# Patient Record
Sex: Female | Born: 1960 | Race: Black or African American | Hispanic: No | Marital: Married | Smoking: Never smoker
Health system: Southern US, Community
[De-identification: ages and names within clinical notes are randomized; demographics above are authoritative.]

## PROBLEM LIST (undated history)

## (undated) DIAGNOSIS — Z9889 Other specified postprocedural states: Secondary | ICD-10-CM

## (undated) DIAGNOSIS — H269 Unspecified cataract: Secondary | ICD-10-CM

## (undated) DIAGNOSIS — R7611 Nonspecific reaction to tuberculin skin test without active tuberculosis: Secondary | ICD-10-CM

## (undated) DIAGNOSIS — R51 Headache: Secondary | ICD-10-CM

## (undated) DIAGNOSIS — R112 Nausea with vomiting, unspecified: Secondary | ICD-10-CM

## (undated) DIAGNOSIS — S82209A Unspecified fracture of shaft of unspecified tibia, initial encounter for closed fracture: Secondary | ICD-10-CM

## (undated) DIAGNOSIS — E785 Hyperlipidemia, unspecified: Secondary | ICD-10-CM

## (undated) DIAGNOSIS — K8020 Calculus of gallbladder without cholecystitis without obstruction: Secondary | ICD-10-CM

## (undated) DIAGNOSIS — K219 Gastro-esophageal reflux disease without esophagitis: Secondary | ICD-10-CM

## (undated) DIAGNOSIS — D219 Benign neoplasm of connective and other soft tissue, unspecified: Secondary | ICD-10-CM

## (undated) DIAGNOSIS — I10 Essential (primary) hypertension: Secondary | ICD-10-CM

## (undated) DIAGNOSIS — IMO0002 Reserved for concepts with insufficient information to code with codable children: Secondary | ICD-10-CM

## (undated) DIAGNOSIS — B019 Varicella without complication: Secondary | ICD-10-CM

## (undated) DIAGNOSIS — M1990 Unspecified osteoarthritis, unspecified site: Secondary | ICD-10-CM

## (undated) DIAGNOSIS — G43909 Migraine, unspecified, not intractable, without status migrainosus: Secondary | ICD-10-CM

## (undated) DIAGNOSIS — N809 Endometriosis, unspecified: Secondary | ICD-10-CM

## (undated) HISTORY — DX: Reserved for concepts with insufficient information to code with codable children: IMO0002

## (undated) HISTORY — DX: Essential (primary) hypertension: I10

## (undated) HISTORY — PX: BREAST EXCISIONAL BIOPSY: SUR124

## (undated) HISTORY — DX: Nonspecific reaction to tuberculin skin test without active tuberculosis: R76.11

## (undated) HISTORY — DX: Unspecified cataract: H26.9

## (undated) HISTORY — DX: Varicella without complication: B01.9

## (undated) HISTORY — DX: Unspecified fracture of shaft of unspecified tibia, initial encounter for closed fracture: S82.209A

## (undated) HISTORY — DX: Endometriosis, unspecified: N80.9

## (undated) HISTORY — PX: LUMBAR FUSION: SHX111

## (undated) HISTORY — DX: Gastro-esophageal reflux disease without esophagitis: K21.9

## (undated) HISTORY — DX: Hyperlipidemia, unspecified: E78.5

## (undated) HISTORY — DX: Calculus of gallbladder without cholecystitis without obstruction: K80.20

## (undated) HISTORY — PX: BREAST BIOPSY: SHX20

## (undated) HISTORY — DX: Benign neoplasm of connective and other soft tissue, unspecified: D21.9

## (undated) HISTORY — DX: Migraine, unspecified, not intractable, without status migrainosus: G43.909

---
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
## 1986-09-02 HISTORY — PX: CHOLECYSTECTOMY: SHX55

## 1986-09-02 HISTORY — PX: DIAGNOSTIC LAPAROSCOPY: SUR761

## 1987-09-03 DIAGNOSIS — R87619 Unspecified abnormal cytological findings in specimens from cervix uteri: Secondary | ICD-10-CM

## 1987-09-03 DIAGNOSIS — IMO0002 Reserved for concepts with insufficient information to code with codable children: Secondary | ICD-10-CM

## 1987-09-03 HISTORY — DX: Reserved for concepts with insufficient information to code with codable children: IMO0002

## 1987-09-03 HISTORY — DX: Unspecified abnormal cytological findings in specimens from cervix uteri: R87.619

## 1992-09-02 DIAGNOSIS — D219 Benign neoplasm of connective and other soft tissue, unspecified: Secondary | ICD-10-CM

## 1992-09-02 HISTORY — DX: Benign neoplasm of connective and other soft tissue, unspecified: D21.9

## 1997-09-02 HISTORY — PX: BACK SURGERY: SHX140

## 1997-11-30 ENCOUNTER — Other Ambulatory Visit: Admission: RE | Admit: 1997-11-30 | Discharge: 1997-11-30 | Payer: Self-pay | Admitting: Obstetrics and Gynecology

## 1998-02-10 ENCOUNTER — Encounter: Admission: RE | Admit: 1998-02-10 | Discharge: 1998-05-11 | Payer: Self-pay | Admitting: Anesthesiology

## 1998-04-18 ENCOUNTER — Inpatient Hospital Stay (HOSPITAL_COMMUNITY): Admission: RE | Admit: 1998-04-18 | Discharge: 1998-04-23 | Payer: Self-pay | Admitting: Specialist

## 1998-05-11 ENCOUNTER — Ambulatory Visit (HOSPITAL_COMMUNITY): Admission: RE | Admit: 1998-05-11 | Discharge: 1998-05-11 | Payer: Self-pay | Admitting: Specialist

## 1998-05-15 ENCOUNTER — Ambulatory Visit (HOSPITAL_COMMUNITY): Admission: RE | Admit: 1998-05-15 | Discharge: 1998-05-15 | Payer: Self-pay | Admitting: Specialist

## 1998-06-08 ENCOUNTER — Ambulatory Visit (HOSPITAL_COMMUNITY): Admission: RE | Admit: 1998-06-08 | Discharge: 1998-06-08 | Payer: Self-pay | Admitting: Specialist

## 1998-06-08 ENCOUNTER — Encounter: Payer: Self-pay | Admitting: Specialist

## 1998-06-12 ENCOUNTER — Ambulatory Visit (HOSPITAL_COMMUNITY): Admission: RE | Admit: 1998-06-12 | Discharge: 1998-06-12 | Payer: Self-pay | Admitting: Specialist

## 1998-07-18 ENCOUNTER — Encounter: Admission: RE | Admit: 1998-07-18 | Discharge: 1998-10-03 | Payer: Self-pay | Admitting: Anesthesiology

## 1998-08-02 ENCOUNTER — Ambulatory Visit (HOSPITAL_COMMUNITY): Admission: RE | Admit: 1998-08-02 | Discharge: 1998-08-02 | Payer: Self-pay | Admitting: Family Medicine

## 1998-12-16 ENCOUNTER — Emergency Department (HOSPITAL_COMMUNITY): Admission: EM | Admit: 1998-12-16 | Discharge: 1998-12-17 | Payer: Self-pay | Admitting: Emergency Medicine

## 1998-12-17 ENCOUNTER — Encounter: Payer: Self-pay | Admitting: Emergency Medicine

## 1999-03-23 ENCOUNTER — Encounter: Payer: Self-pay | Admitting: Specialist

## 1999-03-23 ENCOUNTER — Ambulatory Visit (HOSPITAL_COMMUNITY): Admission: RE | Admit: 1999-03-23 | Discharge: 1999-03-23 | Payer: Self-pay | Admitting: Specialist

## 1999-04-11 ENCOUNTER — Other Ambulatory Visit: Admission: RE | Admit: 1999-04-11 | Discharge: 1999-04-11 | Payer: Self-pay | Admitting: Obstetrics and Gynecology

## 1999-04-25 ENCOUNTER — Encounter (INDEPENDENT_AMBULATORY_CARE_PROVIDER_SITE_OTHER): Payer: Self-pay

## 1999-04-25 ENCOUNTER — Other Ambulatory Visit: Admission: RE | Admit: 1999-04-25 | Discharge: 1999-04-25 | Payer: Self-pay | Admitting: Obstetrics and Gynecology

## 1999-05-04 ENCOUNTER — Encounter: Payer: Self-pay | Admitting: Specialist

## 1999-05-04 ENCOUNTER — Ambulatory Visit (HOSPITAL_COMMUNITY): Admission: RE | Admit: 1999-05-04 | Discharge: 1999-05-04 | Payer: Self-pay | Admitting: Specialist

## 1999-05-18 ENCOUNTER — Encounter: Payer: Self-pay | Admitting: Specialist

## 1999-05-18 ENCOUNTER — Ambulatory Visit (HOSPITAL_COMMUNITY): Admission: RE | Admit: 1999-05-18 | Discharge: 1999-05-18 | Payer: Self-pay | Admitting: Specialist

## 1999-08-21 ENCOUNTER — Other Ambulatory Visit: Admission: RE | Admit: 1999-08-21 | Discharge: 1999-08-21 | Payer: Self-pay | Admitting: Obstetrics and Gynecology

## 1999-12-07 ENCOUNTER — Encounter: Admission: RE | Admit: 1999-12-07 | Discharge: 1999-12-07 | Payer: Self-pay | Admitting: Neurosurgery

## 1999-12-07 ENCOUNTER — Encounter: Payer: Self-pay | Admitting: Neurosurgery

## 2000-01-18 ENCOUNTER — Other Ambulatory Visit: Admission: RE | Admit: 2000-01-18 | Discharge: 2000-01-18 | Payer: Self-pay | Admitting: Obstetrics and Gynecology

## 2000-05-22 ENCOUNTER — Other Ambulatory Visit: Admission: RE | Admit: 2000-05-22 | Discharge: 2000-05-22 | Payer: Self-pay

## 2000-06-05 ENCOUNTER — Other Ambulatory Visit: Admission: RE | Admit: 2000-06-05 | Discharge: 2000-06-05 | Payer: Self-pay | Admitting: Obstetrics and Gynecology

## 2000-06-05 ENCOUNTER — Encounter (INDEPENDENT_AMBULATORY_CARE_PROVIDER_SITE_OTHER): Payer: Self-pay | Admitting: Specialist

## 2001-03-09 ENCOUNTER — Other Ambulatory Visit: Admission: RE | Admit: 2001-03-09 | Discharge: 2001-03-09 | Payer: Self-pay | Admitting: Obstetrics and Gynecology

## 2003-05-30 ENCOUNTER — Other Ambulatory Visit: Admission: RE | Admit: 2003-05-30 | Discharge: 2003-05-30 | Payer: Self-pay | Admitting: Obstetrics and Gynecology

## 2003-11-02 ENCOUNTER — Other Ambulatory Visit: Admission: RE | Admit: 2003-11-02 | Discharge: 2003-11-02 | Payer: Self-pay | Admitting: Obstetrics and Gynecology

## 2003-11-21 ENCOUNTER — Emergency Department (HOSPITAL_COMMUNITY): Admission: EM | Admit: 2003-11-21 | Discharge: 2003-11-21 | Payer: Self-pay | Admitting: Emergency Medicine

## 2004-06-12 ENCOUNTER — Other Ambulatory Visit: Admission: RE | Admit: 2004-06-12 | Discharge: 2004-06-12 | Payer: Self-pay | Admitting: Obstetrics and Gynecology

## 2005-09-11 ENCOUNTER — Other Ambulatory Visit: Admission: RE | Admit: 2005-09-11 | Discharge: 2005-09-11 | Payer: Self-pay | Admitting: Obstetrics and Gynecology

## 2006-04-21 ENCOUNTER — Other Ambulatory Visit: Admission: RE | Admit: 2006-04-21 | Discharge: 2006-04-21 | Payer: Self-pay | Admitting: Obstetrics and Gynecology

## 2006-10-27 ENCOUNTER — Ambulatory Visit (HOSPITAL_COMMUNITY): Admission: RE | Admit: 2006-10-27 | Discharge: 2006-10-27 | Payer: Self-pay | Admitting: Orthopedic Surgery

## 2006-11-26 ENCOUNTER — Other Ambulatory Visit: Admission: RE | Admit: 2006-11-26 | Discharge: 2006-11-26 | Payer: Self-pay | Admitting: Obstetrics and Gynecology

## 2006-12-26 ENCOUNTER — Encounter: Admission: RE | Admit: 2006-12-26 | Discharge: 2006-12-26 | Payer: Self-pay | Admitting: Neurosurgery

## 2007-12-07 ENCOUNTER — Other Ambulatory Visit: Admission: RE | Admit: 2007-12-07 | Discharge: 2007-12-07 | Payer: Self-pay | Admitting: Obstetrics and Gynecology

## 2008-04-22 ENCOUNTER — Other Ambulatory Visit: Admission: RE | Admit: 2008-04-22 | Discharge: 2008-04-22 | Payer: Self-pay | Admitting: Obstetrics and Gynecology

## 2008-08-29 ENCOUNTER — Other Ambulatory Visit: Admission: RE | Admit: 2008-08-29 | Discharge: 2008-08-29 | Payer: Self-pay | Admitting: Obstetrics and Gynecology

## 2008-12-14 ENCOUNTER — Other Ambulatory Visit: Admission: RE | Admit: 2008-12-14 | Discharge: 2008-12-14 | Payer: Self-pay | Admitting: Obstetrics and Gynecology

## 2009-04-05 ENCOUNTER — Ambulatory Visit (HOSPITAL_COMMUNITY): Admission: RE | Admit: 2009-04-05 | Discharge: 2009-04-05 | Payer: Self-pay | Admitting: Orthopedic Surgery

## 2009-12-19 ENCOUNTER — Encounter
Admission: RE | Admit: 2009-12-19 | Discharge: 2009-12-19 | Payer: Self-pay | Source: Home / Self Care | Admitting: Internal Medicine

## 2010-09-02 HISTORY — PX: EYE SURGERY: SHX253

## 2010-09-10 ENCOUNTER — Ambulatory Visit (HOSPITAL_COMMUNITY): Admission: RE | Admit: 2010-09-10 | Payer: Self-pay | Source: Home / Self Care | Admitting: Ophthalmology

## 2010-09-12 ENCOUNTER — Ambulatory Visit (HOSPITAL_COMMUNITY)
Admission: RE | Admit: 2010-09-12 | Discharge: 2010-09-12 | Payer: Self-pay | Source: Home / Self Care | Attending: Ophthalmology | Admitting: Ophthalmology

## 2010-09-23 ENCOUNTER — Encounter: Payer: Self-pay | Admitting: Orthopedic Surgery

## 2010-10-03 ENCOUNTER — Encounter: Payer: Self-pay | Admitting: Ophthalmology

## 2011-06-18 ENCOUNTER — Other Ambulatory Visit (HOSPITAL_COMMUNITY): Payer: Self-pay | Admitting: Neurosurgery

## 2011-06-18 DIAGNOSIS — M545 Low back pain, unspecified: Secondary | ICD-10-CM

## 2011-06-18 DIAGNOSIS — M5416 Radiculopathy, lumbar region: Secondary | ICD-10-CM

## 2011-06-21 ENCOUNTER — Ambulatory Visit (HOSPITAL_COMMUNITY)
Admission: RE | Admit: 2011-06-21 | Discharge: 2011-06-21 | Disposition: A | Discharge status: Home / Self Care | Payer: PRIVATE HEALTH INSURANCE | Source: Ambulatory Visit | Attending: Neurosurgery | Admitting: Neurosurgery

## 2011-06-21 DIAGNOSIS — R209 Unspecified disturbances of skin sensation: Secondary | ICD-10-CM | POA: Insufficient documentation

## 2011-06-21 DIAGNOSIS — M79609 Pain in unspecified limb: Secondary | ICD-10-CM | POA: Insufficient documentation

## 2011-06-21 DIAGNOSIS — M7130 Other bursal cyst, unspecified site: Secondary | ICD-10-CM | POA: Insufficient documentation

## 2011-06-21 DIAGNOSIS — M129 Arthropathy, unspecified: Secondary | ICD-10-CM | POA: Insufficient documentation

## 2011-06-21 DIAGNOSIS — M48061 Spinal stenosis, lumbar region without neurogenic claudication: Secondary | ICD-10-CM | POA: Insufficient documentation

## 2011-06-21 DIAGNOSIS — M5416 Radiculopathy, lumbar region: Secondary | ICD-10-CM

## 2011-06-21 DIAGNOSIS — M545 Low back pain, unspecified: Secondary | ICD-10-CM | POA: Insufficient documentation

## 2011-06-21 DIAGNOSIS — Z981 Arthrodesis status: Secondary | ICD-10-CM | POA: Insufficient documentation

## 2011-06-21 MED ORDER — GADOBENATE DIMEGLUMINE 529 MG/ML IV SOLN
20.0000 mL | Freq: Once | INTRAVENOUS | Status: AC | PRN
  Administered 2011-06-21: 17 mL via INTRAVENOUS

## 2011-07-09 ENCOUNTER — Encounter (HOSPITAL_COMMUNITY): Payer: Self-pay

## 2011-07-10 ENCOUNTER — Encounter (HOSPITAL_COMMUNITY): Payer: Self-pay | Admitting: Pharmacy Technician

## 2011-07-11 ENCOUNTER — Other Ambulatory Visit (HOSPITAL_COMMUNITY): Payer: PRIVATE HEALTH INSURANCE

## 2011-07-12 ENCOUNTER — Encounter (HOSPITAL_COMMUNITY): Admission: RE | Payer: Self-pay | Source: Ambulatory Visit

## 2011-07-12 ENCOUNTER — Inpatient Hospital Stay (HOSPITAL_COMMUNITY): Admission: RE | Admit: 2011-07-12 | Payer: PRIVATE HEALTH INSURANCE | Source: Ambulatory Visit | Admitting: Neurosurgery

## 2011-07-12 SURGERY — POSTERIOR LUMBAR FUSION 2 LEVEL
Anesthesia: General

## 2011-08-03 HISTORY — PX: LUMBAR FUSION: SHX111

## 2011-08-21 ENCOUNTER — Other Ambulatory Visit: Payer: Self-pay | Admitting: Neurosurgery

## 2011-08-21 ENCOUNTER — Encounter (HOSPITAL_COMMUNITY): Payer: Self-pay | Admitting: Pharmacy Technician

## 2011-08-23 ENCOUNTER — Encounter (HOSPITAL_COMMUNITY): Payer: Self-pay

## 2011-08-23 ENCOUNTER — Encounter (HOSPITAL_COMMUNITY)
Admission: RE | Admit: 2011-08-23 | Discharge: 2011-08-23 | Disposition: A | Discharge status: Home / Self Care | Payer: PRIVATE HEALTH INSURANCE | Source: Ambulatory Visit | Attending: Neurosurgery | Admitting: Neurosurgery

## 2011-08-23 HISTORY — DX: Headache: R51

## 2011-08-23 HISTORY — DX: Unspecified osteoarthritis, unspecified site: M19.90

## 2011-08-23 LAB — BASIC METABOLIC PANEL
CO2: 26 mEq/L (ref 19–32)
Calcium: 10.4 mg/dL (ref 8.4–10.5)
Chloride: 103 mEq/L (ref 96–112)
Creatinine, Ser: 0.56 mg/dL (ref 0.50–1.10)
Glucose, Bld: 95 mg/dL (ref 70–99)

## 2011-08-23 LAB — CBC
HCT: 39.4 % (ref 36.0–46.0)
MCH: 27.8 pg (ref 26.0–34.0)
MCV: 86.8 fL (ref 78.0–100.0)
Platelets: 226 10*3/uL (ref 150–400)
RDW: 12.5 % (ref 11.5–15.5)

## 2011-08-23 LAB — SURGICAL PCR SCREEN
MRSA, PCR: NEGATIVE
Staphylococcus aureus: NEGATIVE

## 2011-08-23 NOTE — Progress Notes (Signed)
ekg req from dr Cala Bradford [place] by brian tech Blood refusal sent to blood band and dr Franky Macho

## 2011-08-23 NOTE — Pre-Procedure Instructions (Signed)
20 Yvonne Sanders  08/23/2011   Your procedure is scheduled on:  08/28/11  Report to [facility] at 1030 AM.  Call this number if you have problems the morning of surgery: [phone]   Remember:   Do not eat food:After Midnight.  May have clear liquids: up to 4 Hours before arrival.  Clear liquids include soda, tea, black coffee, apple or grape juice, broth.  Take these medicines the morning of surgery with A SIP OF WATER: flexeril, ultram   STOP asa advil, blood thinners ,herbal meds*   Do not wear jewelry, make-up or nail polish.  Do not wear lotions, powders, or perfumes. You may wear deodorant.  Do not shave 48 hours prior to surgery.  Do not bring valuables to the hospital.  Contacts, dentures or bridgework may not be worn into surgery.  Leave suitcase in the car. After surgery it may be brought to your room.  For patients admitted to the hospital, checkout time is 11:00 AM the day of discharge.   Patients discharged the day of surgery will not be allowed to drive home.  Name and phone number of your driver: [name] [phone]  Special Instructions: CHG Shower Use Special Wash: 1/2 bottle night before surgery and 1/2 bottle morning of surgery.   Please read over the following fact sheets that you were given: Pain Booklet, Coughing and Deep Breathing, Blood Transfusion Information, MRSA Information and Surgical Site Infection Prevention

## 2011-08-28 MED ORDER — CEFAZOLIN SODIUM 1-5 GM-% IV SOLN: 1.0000 g | INTRAVENOUS | Status: DC

## 2011-08-28 NOTE — Progress Notes (Signed)
Notified patient that surgery is on12/2 and to be at short Stay by 0930.  Pt repeated information.

## 2011-08-29 ENCOUNTER — Encounter (HOSPITAL_COMMUNITY): Payer: Self-pay | Admitting: Anesthesiology

## 2011-08-29 ENCOUNTER — Ambulatory Visit (HOSPITAL_COMMUNITY): Payer: PRIVATE HEALTH INSURANCE

## 2011-08-29 ENCOUNTER — Encounter (HOSPITAL_COMMUNITY): Payer: Self-pay | Admitting: *Deleted

## 2011-08-29 ENCOUNTER — Ambulatory Visit (HOSPITAL_COMMUNITY): Payer: PRIVATE HEALTH INSURANCE | Admitting: Anesthesiology

## 2011-08-29 ENCOUNTER — Inpatient Hospital Stay (HOSPITAL_COMMUNITY)
Admission: RE | Admit: 2011-08-29 | Discharge: 2011-09-01 | DRG: 460 | Disposition: A | Discharge status: Home / Self Care | Payer: PRIVATE HEALTH INSURANCE | Source: Ambulatory Visit | Attending: Neurosurgery | Admitting: Neurosurgery

## 2011-08-29 ENCOUNTER — Encounter (HOSPITAL_COMMUNITY)
Admission: RE | Disposition: A | Discharge status: Home / Self Care | Payer: Self-pay | Source: Ambulatory Visit | Attending: Neurosurgery

## 2011-08-29 DIAGNOSIS — M47817 Spondylosis without myelopathy or radiculopathy, lumbosacral region: Principal | ICD-10-CM | POA: Diagnosis present

## 2011-08-29 DIAGNOSIS — M48061 Spinal stenosis, lumbar region without neurogenic claudication: Secondary | ICD-10-CM | POA: Diagnosis present

## 2011-08-29 DIAGNOSIS — M7138 Other bursal cyst, other site: Secondary | ICD-10-CM | POA: Diagnosis present

## 2011-08-29 DIAGNOSIS — M47816 Spondylosis without myelopathy or radiculopathy, lumbar region: Secondary | ICD-10-CM | POA: Diagnosis present

## 2011-08-29 DIAGNOSIS — M7130 Other bursal cyst, unspecified site: Secondary | ICD-10-CM | POA: Diagnosis present

## 2011-08-29 DIAGNOSIS — Z6841 Body Mass Index (BMI) 40.0 and over, adult: Secondary | ICD-10-CM

## 2011-08-29 DIAGNOSIS — Z01812 Encounter for preprocedural laboratory examination: Secondary | ICD-10-CM

## 2011-08-29 LAB — GLUCOSE, CAPILLARY: Glucose-Capillary: 65 mg/dL — ABNORMAL LOW (ref 70–99)

## 2011-08-29 SURGERY — POSTERIOR LUMBAR FUSION 1 LEVEL
Anesthesia: General | Site: Spine Lumbar | Wound class: Clean

## 2011-08-29 MED ORDER — HEMOSTATIC AGENTS (NO CHARGE) OPTIME
TOPICAL | Status: DC | PRN
  Administered 2011-08-29: 1 via TOPICAL

## 2011-08-29 MED ORDER — ALUM & MAG HYDROXIDE-SIMETH 200-200-20 MG/5ML PO SUSP: 30.0000 mL | Freq: Four times a day (QID) | ORAL | Status: DC | PRN

## 2011-08-29 MED ORDER — POTASSIUM CHLORIDE IN NACL 20-0.9 MEQ/L-% IV SOLN
INTRAVENOUS | Status: DC
  Administered 2011-08-30 – 2011-09-01 (×4): via INTRAVENOUS
  Filled 2011-08-29 (×6): qty 1000

## 2011-08-29 MED ORDER — PROPOFOL 10 MG/ML IV EMUL
INTRAVENOUS | Status: DC | PRN
  Administered 2011-08-29: 180 mg via INTRAVENOUS
  Administered 2011-08-29: 20 mg via INTRAVENOUS

## 2011-08-29 MED ORDER — MENTHOL 3 MG MT LOZG
1.0000 | LOZENGE | OROMUCOSAL | Status: DC | PRN
  Administered 2011-08-30: 3 mg via ORAL
  Filled 2011-08-29: qty 9

## 2011-08-29 MED ORDER — THROMBIN 20000 UNITS EX KIT
PACK | CUTANEOUS | Status: DC | PRN
  Administered 2011-08-29: 20000 [IU] via TOPICAL

## 2011-08-29 MED ORDER — ONDANSETRON HCL 4 MG/2ML IJ SOLN
INTRAMUSCULAR | Status: AC
  Administered 2011-08-29: 4 mg via INTRAVENOUS
  Filled 2011-08-29: qty 2

## 2011-08-29 MED ORDER — NORETHINDRONE 0.35 MG PO TABS
1.0000 | ORAL_TABLET | Freq: Every day | ORAL | Status: DC
  Administered 2011-08-31 – 2011-09-01 (×2): 0.35 mg via ORAL

## 2011-08-29 MED ORDER — HYDROMORPHONE HCL PF 1 MG/ML IJ SOLN
INTRAMUSCULAR | Status: AC
  Administered 2011-08-29: 0.5 mg via INTRAVENOUS
  Filled 2011-08-29: qty 1

## 2011-08-29 MED ORDER — NALOXONE HCL 0.4 MG/ML IJ SOLN
0.4000 mg | INTRAMUSCULAR | Status: DC | PRN
  Filled 2011-08-29: qty 1

## 2011-08-29 MED ORDER — SODIUM CHLORIDE 0.9 % IV SOLN: 250.0000 mL | INTRAVENOUS | Status: DC

## 2011-08-29 MED ORDER — ACETAMINOPHEN 650 MG RE SUPP: 650.0000 mg | RECTAL | Status: DC | PRN

## 2011-08-29 MED ORDER — ONDANSETRON HCL 4 MG/2ML IJ SOLN
4.0000 mg | INTRAMUSCULAR | Status: DC | PRN
  Administered 2011-08-30: 4 mg via INTRAVENOUS
  Filled 2011-08-29: qty 2

## 2011-08-29 MED ORDER — MIDAZOLAM HCL 5 MG/5ML IJ SOLN
INTRAMUSCULAR | Status: DC | PRN
  Administered 2011-08-29: 2 mg via INTRAVENOUS

## 2011-08-29 MED ORDER — NEOSTIGMINE METHYLSULFATE 1 MG/ML IJ SOLN
INTRAMUSCULAR | Status: DC | PRN
  Administered 2011-08-29: 3 mg via INTRAVENOUS

## 2011-08-29 MED ORDER — DEXTROSE-NACL 5-0.2 % IV SOLN
INTRAVENOUS | Status: DC | PRN
  Administered 2011-08-29: 15:00:00 via INTRAVENOUS

## 2011-08-29 MED ORDER — DIPHENHYDRAMINE HCL 50 MG/ML IJ SOLN
12.5000 mg | Freq: Four times a day (QID) | INTRAMUSCULAR | Status: DC | PRN
  Filled 2011-08-29: qty 0.25

## 2011-08-29 MED ORDER — OMEGA-3-ACID ETHYL ESTERS 1 G PO CAPS
2.0000 g | ORAL_CAPSULE | Freq: Every day | ORAL | Status: DC
  Administered 2011-08-30 – 2011-09-01 (×3): 2 g via ORAL
  Filled 2011-08-29 (×3): qty 2

## 2011-08-29 MED ORDER — FAMOTIDINE 10 MG/ML IV SOLN
40.0000 mg/h | Freq: Once | INTRAVENOUS | Status: AC
  Administered 2011-08-30: 40 mg/h via INTRAVENOUS
  Filled 2011-08-29: qty 4

## 2011-08-29 MED ORDER — PHENOL 1.4 % MT LIQD: 1.0000 | OROMUCOSAL | Status: DC | PRN

## 2011-08-29 MED ORDER — PHENYLEPHRINE HCL 10 MG/ML IJ SOLN
INTRAMUSCULAR | Status: DC | PRN
  Administered 2011-08-29: 80 ug via INTRAVENOUS
  Administered 2011-08-29: 40 ug via INTRAVENOUS

## 2011-08-29 MED ORDER — CARBONYL IRON 45 MG PO TABS: 45.0000 mg | ORAL_TABLET | Freq: Every day | ORAL | Status: DC

## 2011-08-29 MED ORDER — THERA M PLUS PO TABS
1.0000 | ORAL_TABLET | Freq: Every day | ORAL | Status: DC
  Administered 2011-08-30 – 2011-09-01 (×3): 1 via ORAL
  Filled 2011-08-29 (×3): qty 1

## 2011-08-29 MED ORDER — CEFAZOLIN SODIUM 1-5 GM-% IV SOLN
1.0000 g | Freq: Three times a day (TID) | INTRAVENOUS | Status: AC
  Administered 2011-08-30: 1 g via INTRAVENOUS
  Filled 2011-08-29 (×2): qty 50

## 2011-08-29 MED ORDER — CEFAZOLIN SODIUM-DEXTROSE 2-3 GM-% IV SOLR
2.0000 g | INTRAVENOUS | Status: AC
  Administered 2011-08-29: 2 g via INTRAVENOUS

## 2011-08-29 MED ORDER — FENTANYL CITRATE 0.05 MG/ML IJ SOLN
INTRAMUSCULAR | Status: DC | PRN
  Administered 2011-08-29 (×8): 50 ug via INTRAVENOUS

## 2011-08-29 MED ORDER — FERROUS SULFATE 325 (65 FE) MG PO TABS
325.0000 mg | ORAL_TABLET | Freq: Every day | ORAL | Status: DC
  Administered 2011-08-30 – 2011-09-01 (×3): 325 mg via ORAL
  Filled 2011-08-29 (×4): qty 1

## 2011-08-29 MED ORDER — ROCURONIUM BROMIDE 100 MG/10ML IV SOLN
INTRAVENOUS | Status: DC | PRN
  Administered 2011-08-29: 30 mg via INTRAVENOUS

## 2011-08-29 MED ORDER — LIDOCAINE HCL (CARDIAC) 20 MG/ML IV SOLN
INTRAVENOUS | Status: DC | PRN
  Administered 2011-08-29: 100 mg via INTRAVENOUS

## 2011-08-29 MED ORDER — CEFAZOLIN SODIUM-DEXTROSE 2-3 GM-% IV SOLR
INTRAVENOUS | Status: AC
  Filled 2011-08-29: qty 50

## 2011-08-29 MED ORDER — LIDOCAINE-EPINEPHRINE 0.5-1:200000 % IJ SOLN
INTRAMUSCULAR | Status: DC | PRN
  Administered 2011-08-29: 5 mL

## 2011-08-29 MED ORDER — ONDANSETRON HCL 4 MG/2ML IJ SOLN
4.0000 mg | Freq: Four times a day (QID) | INTRAMUSCULAR | Status: DC | PRN
  Administered 2011-08-29: 4 mg via INTRAVENOUS
  Filled 2011-08-29 (×2): qty 2

## 2011-08-29 MED ORDER — ACETAMINOPHEN 325 MG PO TABS
650.0000 mg | ORAL_TABLET | ORAL | Status: DC | PRN
  Administered 2011-08-30: 650 mg via ORAL
  Filled 2011-08-29: qty 2

## 2011-08-29 MED ORDER — ONDANSETRON HCL 4 MG/2ML IJ SOLN: 4.0000 mg | Freq: Four times a day (QID) | INTRAMUSCULAR | Status: DC | PRN

## 2011-08-29 MED ORDER — DIPHENHYDRAMINE HCL 12.5 MG/5ML PO ELIX
12.5000 mg | ORAL_SOLUTION | Freq: Four times a day (QID) | ORAL | Status: DC | PRN
  Administered 2011-08-29: 12.5 mg via ORAL
  Filled 2011-08-29: qty 5
  Filled 2011-08-29: qty 10

## 2011-08-29 MED ORDER — TRAMADOL HCL 50 MG PO TABS
50.0000 mg | ORAL_TABLET | Freq: Four times a day (QID) | ORAL | Status: DC | PRN
  Administered 2011-08-30 (×2): 100 mg via ORAL
  Filled 2011-08-29 (×3): qty 2

## 2011-08-29 MED ORDER — ONDANSETRON HCL 4 MG/2ML IJ SOLN
INTRAMUSCULAR | Status: DC | PRN
  Administered 2011-08-29: 4 mg via INTRAVENOUS

## 2011-08-29 MED ORDER — GLYCOPYRROLATE 0.2 MG/ML IJ SOLN
INTRAMUSCULAR | Status: DC | PRN
  Administered 2011-08-29: .5 mg via INTRAVENOUS

## 2011-08-29 MED ORDER — SODIUM CHLORIDE 0.9 % IJ SOLN: 9.0000 mL | INTRAMUSCULAR | Status: DC | PRN

## 2011-08-29 MED ORDER — FOLIC ACID 1 MG PO TABS
1.0000 mg | ORAL_TABLET | Freq: Every day | ORAL | Status: DC
  Administered 2011-08-30 – 2011-09-01 (×3): 1 mg via ORAL
  Filled 2011-08-29 (×3): qty 1

## 2011-08-29 MED ORDER — HYDROMORPHONE HCL PF 1 MG/ML IJ SOLN
0.2500 mg | INTRAMUSCULAR | Status: DC | PRN
  Administered 2011-08-29 (×2): 0.5 mg via INTRAVENOUS

## 2011-08-29 MED ORDER — DIAZEPAM 5 MG PO TABS
5.0000 mg | ORAL_TABLET | Freq: Four times a day (QID) | ORAL | Status: DC | PRN
  Administered 2011-08-30 – 2011-09-01 (×9): 5 mg via ORAL
  Filled 2011-08-29 (×9): qty 1

## 2011-08-29 MED ORDER — HYDROMORPHONE 0.3 MG/ML IV SOLN
INTRAVENOUS | Status: DC
  Administered 2011-08-29: 0.9 mg via INTRAVENOUS
  Administered 2011-08-29: 21:00:00 via INTRAVENOUS

## 2011-08-29 MED ORDER — DROPERIDOL 2.5 MG/ML IJ SOLN
INTRAMUSCULAR | Status: DC | PRN
  Administered 2011-08-29: 0.625 mg via INTRAVENOUS

## 2011-08-29 MED ORDER — 0.9 % SODIUM CHLORIDE (POUR BTL) OPTIME
TOPICAL | Status: DC | PRN
  Administered 2011-08-29: 1000 mL

## 2011-08-29 MED ORDER — HETASTARCH-ELECTROLYTES 6 % IV SOLN
INTRAVENOUS | Status: DC | PRN
  Administered 2011-08-29: 18:00:00 via INTRAVENOUS

## 2011-08-29 MED ORDER — SODIUM CHLORIDE 0.9 % IJ SOLN
3.0000 mL | Freq: Two times a day (BID) | INTRAMUSCULAR | Status: DC
  Administered 2011-08-30 – 2011-08-31 (×3): 3 mL via INTRAVENOUS

## 2011-08-29 MED ORDER — SODIUM CHLORIDE 0.9 % IJ SOLN: 3.0000 mL | INTRAMUSCULAR | Status: DC | PRN

## 2011-08-29 MED ORDER — EZETIMIBE 10 MG PO TABS
10.0000 mg | ORAL_TABLET | Freq: Every day | ORAL | Status: DC
Start: 2011-08-30 — End: 2011-09-01
  Administered 2011-08-30 – 2011-09-01 (×3): 10 mg via ORAL
  Filled 2011-08-29 (×3): qty 1

## 2011-08-29 MED ORDER — LACTATED RINGERS IV SOLN
INTRAVENOUS | Status: DC | PRN
  Administered 2011-08-29 (×3): via INTRAVENOUS

## 2011-08-29 MED ORDER — DOCUSATE SODIUM 100 MG PO CAPS
100.0000 mg | ORAL_CAPSULE | Freq: Two times a day (BID) | ORAL | Status: DC
  Administered 2011-08-30 – 2011-09-01 (×5): 100 mg via ORAL
  Filled 2011-08-29 (×3): qty 1

## 2011-08-29 MED ORDER — OMEGA-3 FATTY ACIDS 1000 MG PO CAPS: 2.0000 g | ORAL_CAPSULE | Freq: Every day | ORAL | Status: DC

## 2011-08-29 MED ORDER — ZOLPIDEM TARTRATE 10 MG PO TABS: 10.0000 mg | ORAL_TABLET | Freq: Every evening | ORAL | Status: DC | PRN

## 2011-08-29 MED ORDER — ROSUVASTATIN CALCIUM 40 MG PO TABS
40.0000 mg | ORAL_TABLET | Freq: Every day | ORAL | Status: DC
  Administered 2011-08-30 – 2011-09-01 (×3): 40 mg via ORAL
  Filled 2011-08-29 (×3): qty 1

## 2011-08-29 SURGICAL SUPPLY — 85 items
10x9x28 4 degree Coroent Cage (Cage) ×1 IMPLANT
5.5x25 Single Ball Rod (Rod) ×1 IMPLANT
5.5x30 Single Ball Rod (Rod) ×1 IMPLANT
ADH SKN CLS APL DERMABOND .7 (GAUZE/BANDAGES/DRESSINGS) ×1
APL SKNCLS STERI-STRIP NONHPOA (GAUZE/BANDAGES/DRESSINGS)
BAG DECANTER FOR FLEXI CONT (MISCELLANEOUS) ×2 IMPLANT
BENZOIN TINCTURE PRP APPL 2/3 (GAUZE/BANDAGES/DRESSINGS) IMPLANT
BLADE SURG ROTATE 9660 (MISCELLANEOUS) IMPLANT
BONE MATRIX OSTEOCEL PLUS 10CC (Bone Implant) ×1 IMPLANT
BUR MATCHSTICK NEURO 3.0 LAGG (BURR) ×2 IMPLANT
CANISTER SUCTION 2500CC (MISCELLANEOUS) ×2 IMPLANT
CLIP NEUROVISION LG (CLIP) ×1 IMPLANT
CLOTH BEACON ORANGE TIMEOUT ST (SAFETY) ×2 IMPLANT
CONT SPEC 4OZ CLIKSEAL STRL BL (MISCELLANEOUS) ×5 IMPLANT
CORDS BIPOLAR (ELECTRODE) ×1 IMPLANT
COVER BACK TABLE 24X17X13 BIG (DRAPES) IMPLANT
DECANTER SPIKE VIAL GLASS SM (MISCELLANEOUS) ×2 IMPLANT
DERMABOND ADVANCED (GAUZE/BANDAGES/DRESSINGS) ×1
DERMABOND ADVANCED .7 DNX12 (GAUZE/BANDAGES/DRESSINGS) ×1 IMPLANT
DRAPE C-ARM 42X72 X-RAY (DRAPES) ×4 IMPLANT
DRAPE C-ARMOR (DRAPES) ×1 IMPLANT
DRAPE LAPAROTOMY 100X72X124 (DRAPES) ×2 IMPLANT
DRAPE POUCH INSTRU U-SHP 10X18 (DRAPES) ×2 IMPLANT
DRAPE SURG 17X23 STRL (DRAPES) ×2 IMPLANT
DRESSING TELFA 8X3 (GAUZE/BANDAGES/DRESSINGS) IMPLANT
DURAPREP 26ML APPLICATOR (WOUND CARE) ×2 IMPLANT
DYNAMIC STIMULATION CLIP AND INLINE ACTIVATOR ×1 IMPLANT
ELECT REM PT RETURN 9FT ADLT (ELECTROSURGICAL) ×2
ELECTRODE REM PT RTRN 9FT ADLT (ELECTROSURGICAL) ×1 IMPLANT
GAUZE SPONGE 4X4 16PLY XRAY LF (GAUZE/BANDAGES/DRESSINGS) IMPLANT
GLOVE BIO SURGEON STRL SZ 6.5 (GLOVE) ×2 IMPLANT
GLOVE BIO SURGEON STRL SZ7 (GLOVE) ×1 IMPLANT
GLOVE BIOGEL PI IND STRL 6.5 (GLOVE) IMPLANT
GLOVE BIOGEL PI IND STRL 7.5 (GLOVE) IMPLANT
GLOVE BIOGEL PI IND STRL 8.5 (GLOVE) IMPLANT
GLOVE BIOGEL PI INDICATOR 6.5 (GLOVE) ×2
GLOVE BIOGEL PI INDICATOR 7.5 (GLOVE) ×1
GLOVE BIOGEL PI INDICATOR 8.5 (GLOVE) ×1
GLOVE ECLIPSE 6.5 STRL STRAW (GLOVE) ×4 IMPLANT
GLOVE ECLIPSE 7.5 STRL STRAW (GLOVE) ×1 IMPLANT
GLOVE EXAM NITRILE LRG STRL (GLOVE) IMPLANT
GLOVE EXAM NITRILE MD LF STRL (GLOVE) ×1 IMPLANT
GLOVE EXAM NITRILE XL STR (GLOVE) IMPLANT
GLOVE EXAM NITRILE XS STR PU (GLOVE) IMPLANT
GLOVE SURG SS PI 7.0 STRL IVOR (GLOVE) ×1 IMPLANT
GLOVE SURG SS PI 8.0 STRL IVOR (GLOVE) ×1 IMPLANT
GOWN BRE IMP SLV AUR LG STRL (GOWN DISPOSABLE) ×6 IMPLANT
GOWN BRE IMP SLV AUR XL STRL (GOWN DISPOSABLE) ×1 IMPLANT
GOWN STRL REIN 2XL LVL4 (GOWN DISPOSABLE) ×1 IMPLANT
HEADS TULIP (Orthopedic Implant) ×4 IMPLANT
KIT BASIN OR (CUSTOM PROCEDURE TRAY) ×2 IMPLANT
KIT NDL NVM5 EMG ELECT (KITS) IMPLANT
KIT NEEDLE NVM5 EMG ELECT (KITS) ×1 IMPLANT
KIT NEEDLE NVM5 EMG ELECTRODE (KITS) ×1
KIT POSITION SURG JACKSON T1 (MISCELLANEOUS) ×2 IMPLANT
KIT ROOM TURNOVER OR (KITS) ×2 IMPLANT
LIGHT SOURCE ANGLE TIP STR 7FT (MISCELLANEOUS) ×1 IMPLANT
NDL HYPO 25X1 1.5 SAFETY (NEEDLE) ×1 IMPLANT
NDL SPNL 18GX3.5 QUINCKE PK (NEEDLE) IMPLANT
NEEDLE HYPO 25X1 1.5 SAFETY (NEEDLE) ×2 IMPLANT
NEEDLE SPNL 18GX3.5 QUINCKE PK (NEEDLE) IMPLANT
NS IRRIG 1000ML POUR BTL (IV SOLUTION) ×2 IMPLANT
NVM5 PROBE ×1 IMPLANT
NVMS EMG NEEDLE MODULE ×1 IMPLANT
PACK LAMINECTOMY NEURO (CUSTOM PROCEDURE TRAY) ×2 IMPLANT
PAD ARMBOARD 7.5X6 YLW CONV (MISCELLANEOUS) ×4 IMPLANT
PATTIES SURGICAL .5 X.5 (GAUZE/BANDAGES/DRESSINGS) ×1 IMPLANT
PROBE BALL TIP NVM5 SNG USE (BALLOONS) ×1 IMPLANT
SCREW LOCK 5.5MM ROD (Screw) ×4 IMPLANT
SCREW SHANK MASTLIS 5.5X30 (Screw) ×2 IMPLANT
SCREW SHANK MASTLIS 5.5X35 (Screw) ×2 IMPLANT
SLEEVE SURGEON STRL (DRAPES) ×2 IMPLANT
SPONGE GAUZE 4X4 12PLY (GAUZE/BANDAGES/DRESSINGS) IMPLANT
SPONGE LAP 4X18 X RAY DECT (DISPOSABLE) IMPLANT
SPONGE SURGIFOAM ABS GEL 100 (HEMOSTASIS) ×2 IMPLANT
STRIP CLOSURE SKIN 1/2X4 (GAUZE/BANDAGES/DRESSINGS) IMPLANT
SUT PROLENE 6 0 BV (SUTURE) IMPLANT
SUT VIC AB 0 CT1 18XCR BRD8 (SUTURE) ×1 IMPLANT
SUT VIC AB 0 CT1 8-18 (SUTURE) ×2
SUT VIC AB 2-0 CT1 18 (SUTURE) ×2 IMPLANT
SUT VIC AB 3-0 SH 8-18 (SUTURE) ×2 IMPLANT
SYR 20ML ECCENTRIC (SYRINGE) ×2 IMPLANT
TOWEL OR 17X24 6PK STRL BLUE (TOWEL DISPOSABLE) ×2 IMPLANT
TOWEL OR 17X26 10 PK STRL BLUE (TOWEL DISPOSABLE) ×2 IMPLANT
WATER STERILE IRR 1000ML POUR (IV SOLUTION) ×2 IMPLANT

## 2011-08-29 NOTE — Preoperative (Signed)
Beta Blockers   Reason not to administer Beta Blockers:Pt does not take B Blockers 

## 2011-08-29 NOTE — H&P (Addendum)
  BP 146/92  Pulse 85  Temp(Src) 98.3 F (36.8 C) (Oral)  Resp 20  Ht 5' 5.5" (1.664 m)  Wt 87.091 kg (192 lb)  BMI 31.46 kg/m2  SpO2 96% Yvonne Sanders returned to my office with new complaints of back and lower extremity pain..  She has a new MRI of the lumbar spine. I compared this to the scan that she had in 2008 of the lumbar spine.  She has advanced facet arthropathy at L3/4 with a great deal of fluid and joint separation present.  She has a large synovial cyst on the left side compressing the thecal sac and left L4 root.  She is stenotic at this level with some redundant ligamentum flavum.  The rest of the spine looks quite good.    This is clearly the reason why she is having the lower extremity pain and clearly why the left lower extremity pain is worse.  Unfortunately, looking at this, there is no simple operation that could be done just a fusion.  To do anything else would render her 100% certain that she would have future instability which is greater than the instability that is already present now.  The synovial cyst and facet joints in comparison are obvious that she is not stable at the L3/4 level.    We discussed at length what the surgery would entail.  She cannot have an anterior approach because the synovial cyst has to be removed.  She does need a posterior decompression rendering a lateral approach essentially useless for the decompression and if one needs to go posteriorly you might as well do this from a posterior approach.  I did tell her that I would use interbody cages that would be smaller than the Ray cages. She has very bad memories of that procedure and needed to make sure that she understood why we were going to do this and the reason for placing the cages.  We spoke at length about the fact that I think it will be four months before the operation is not the first thing she thinks about each morning, that her leg pain should improve much quicker than the back problems.  She  had not been living with a great deal of back pain or nothing more than a baseline level and it was only when she developed this lower extremity discomfort that she knew this was a different scenario than had been present previously.  physical exam Alert and oriented x4, speech clear and fluent Peerl, full eom Symmetric facies Hearing intact to voice Tongue and uvula midline 5./5 strength in lower extremities. Normal muscle tone and bulk. Reflexes intact 2+ in lower extremities I spent about 40 minutes with Yvonne Sanders going over this today.  She is going to think about the procedure.  We went over the risks and benefits and I gave her a very detailed instruction sheet which goes over the risks of fusion, fusion failure, hardware failure, need for further surgery, future degeneration of the disc spaces which have not been fused, damage to the nerve roots, bowel or bladder dysfunction, and infection.  She understands and will proceed with lumbar fusion at L3/4. Risks and benefits including but not limited to bleeding infection, need for further surgery, fusion failure, hardware fAilure, no relief of pain, weakness, bowel and or bladder dysfunction were discuseed.

## 2011-08-29 NOTE — Anesthesia Procedure Notes (Addendum)
Procedure Name: Intubation Date/Time: 08/29/2011 3:17 PM Performed by: Rossie Muskrat Pre-anesthesia Checklist: Patient identified, Timeout performed, Emergency Drugs available, Suction available and Patient being monitored Patient Re-evaluated:Patient Re-evaluated prior to inductionOxygen Delivery Method: Circle System Utilized Preoxygenation: Pre-oxygenation with 100% oxygen Intubation Type: IV induction Ventilation: Mask ventilation without difficulty Laryngoscope Size: Miller and 2 Grade View: Grade I Tube type: Oral Tube size: 7.5 mm Number of attempts: 1 Airway Equipment and Method: stylet Placement Confirmation: ETT inserted through vocal cords under direct vision,  breath sounds checked- equal and bilateral and positive ETCO2 Secured at: 21 cm Tube secured with: Tape Dental Injury: Teeth and Oropharynx as per pre-operative assessment

## 2011-08-29 NOTE — Op Note (Signed)
08/29/2011  8:08 PM  PATIENT:  Yvonne Sanders  50 y.o. 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 Synovial Cyst Left L3/4  POST-OPERATIVE DIAGNOSIS:  Lumbar Stenosis, Lumbar Spondylosis,synovial Cyst Synovial Cyst Left L3/4  PROCEDURE:  Procedure(s): POSTERIOR LUMBAR Interbody FUSION 1 LEVEL nuvasive 10x9x56mm Peek cage L3/4, morsellized autograft nonsegmental pedicle screw placement, cortical variant 5.49mm 35mm, and 30mm lengths Nuvasive Posterolateral Arthrodesis L3/4 morsellized allograft Lumbar decompression beyond what was needed for a PLIF  SURGEON:  Surgeon(s): Ronaldo Miyamoto L Gabbriella Presswood [employer]  ASSISTANTS:botero  ANESTHESIA:   general  EBL:  Total I/O In: 1000 [I.V.:1000] Out: 200 [Urine:100; Blood:100]  BLOOD ADMINISTERED:none  CELL SAVER GIVEN:none   COUNT:per nursing   DRAINS: none   SPECIMEN:  No Specimen  DICTATION: Mrs. Bova was brought to the operating room intubated, placed under a general anesthetic, and positioned prone on the jackson table. Prior to positioning a foley catheter was placed under sterile conditions. All pressure points were properly padded. Needles were also placed to allow for neuromonitoring during the case.  Her back was prepped and draped in a sterile fashion. I opened the lumbar region and exposed the lamina of L2, L3, and L4. I exposed the pars interarticularis bilaterally of L3, and L4. I also exposed the facets of L3/4. Having confirmed my location with intraoperative flouroscopy I placed 4 cortical medial to lateral trajectory pedicle screws. I also used neuromonitoring during screw placement.The Right L4 screw was slightly lateral at its exit but did have good purchase.  I then decompressed the L3 and L4 nerve roots bilaterally which was not necessary to complete the PLIF. I also decompressed the spinal canal at L3/4, and removed the synovial cyst on the left side. I performed a  discetomy on the Right side only, due to what appeared to be a nerve root overlying the disc space on the left side. With the discetomy complete I sized the space and placed a 10x9x27mm peek cage into the disc space. I placed morsellized autograft into the disc space around the cage to complete the PLIF. I decorticated the facets and lateral lamina and placed morsellized auto and allograft(osteocel) laterally. I completed the pedicle screw construct by connecting the rod to the screws with locking caps. I performed the decompression, Plif, pla, with dr. Cassandria Santee assistance. We closed the wound in layers using vicryl sutures. I used dermabond for a sterile dressing. Mrs. Mazo was extubated and moving all extremities post op.  PLAN OF CARE: Admit to inpatient   PATIENT DISPOSITION:  PACU - hemodynamically stable.   Delay start of Pharmacological VTE agent (>24hrs) due to surgical blood loss or risk of bleeding:  yes

## 2011-08-29 NOTE — Transfer of Care (Signed)
Immediate Anesthesia Transfer of Care Note  Patient: Yvonne Sanders  Procedure(s) Performed:  POSTERIOR LUMBAR FUSION 1 LEVEL - Lumbar three-four posterior lumbar interbody fusion with arthrodesis and cages with posterior segmental instrumentation  Patient Location: PACU  Anesthesia Type: General  Level of Consciousness: awake, sedated, patient cooperative and responds to stimulation  Airway & Oxygen Therapy: Patient Spontanous Breathing and Patient connected to nasal cannula oxygen  Post-op Assessment: Report given to PACU RN, Post -op Vital signs reviewed and stable, Patient moving all extremities and Patient moving all extremities X 4  Post vital signs: Reviewed and stable  Complications: No apparent anesthesia complications

## 2011-08-29 NOTE — Anesthesia Postprocedure Evaluation (Signed)
  Anesthesia Post-op Note  Patient: Yvonne Sanders  Procedure(s) Performed:  POSTERIOR LUMBAR FUSION 1 LEVEL - Lumbar three-four posterior lumbar interbody fusion with arthrodesis and cages with posterior segmental instrumentation  Patient Location: PACU  Anesthesia Type: General  Level of Consciousness: awake, alert  and oriented  Airway and Oxygen Therapy: Patient Spontanous Breathing and Patient connected to nasal cannula oxygen  Post-op Pain: mild  Post-op Assessment: Post-op Vital signs reviewed, Patient's Cardiovascular Status Stable, Respiratory Function Stable, Patent Airway, No signs of Nausea or vomiting and Pain level controlled  Post-op Vital Signs: Reviewed and stable  Complications: No apparent anesthesia complications

## 2011-08-29 NOTE — Anesthesia Preprocedure Evaluation (Addendum)
Anesthesia Evaluation  Patient identified by MRN, date of birth, ID band Patient awake    Reviewed: Allergy & Precautions, H&P , NPO status , Patient's Chart, lab work & pertinent test results  Airway Mallampati: II  Neck ROM: full    Dental   Pulmonary          Cardiovascular     Neuro/Psych  Headaches,    GI/Hepatic   Endo/Other  Morbid obesity  Renal/GU      Musculoskeletal  (+) Arthritis -, Osteoarthritis,    Abdominal   Peds  Hematology   Anesthesia Other Findings   Reproductive/Obstetrics                          Anesthesia Physical Anesthesia Plan  ASA: II  Anesthesia Plan: General   Post-op Pain Management:    Induction: Intravenous  Airway Management Planned: Oral ETT  Additional Equipment:   Intra-op Plan:   Post-operative Plan: Extubation in OR  Informed Consent: I have reviewed the patients History and Physical, chart, labs and discussed the procedure including the risks, benefits and alternatives for the proposed anesthesia with the patient or authorized representative who has indicated his/her understanding and acceptance.     Plan Discussed with: CRNA and Surgeon  Anesthesia Plan Comments:         Anesthesia Quick Evaluation

## 2011-08-30 LAB — GLUCOSE, CAPILLARY: Glucose-Capillary: 85 mg/dL (ref 70–99)

## 2011-08-30 MED ORDER — SODIUM CHLORIDE 0.9 % IJ SOLN: 9.0000 mL | INTRAMUSCULAR | Status: DC | PRN

## 2011-08-30 MED ORDER — ONDANSETRON HCL 4 MG/2ML IJ SOLN: 4.0000 mg | Freq: Four times a day (QID) | INTRAMUSCULAR | Status: DC | PRN

## 2011-08-30 MED ORDER — DIPHENHYDRAMINE HCL 50 MG/ML IJ SOLN: 12.5000 mg | Freq: Four times a day (QID) | INTRAMUSCULAR | Status: DC | PRN

## 2011-08-30 MED ORDER — NALOXONE HCL 0.4 MG/ML IJ SOLN: 0.4000 mg | INTRAMUSCULAR | Status: DC | PRN

## 2011-08-30 MED ORDER — DIPHENHYDRAMINE HCL 12.5 MG/5ML PO ELIX: 12.5000 mg | ORAL_SOLUTION | Freq: Four times a day (QID) | ORAL | Status: DC | PRN

## 2011-08-30 MED ORDER — PROCHLORPERAZINE 25 MG RE SUPP
25.0000 mg | Freq: Two times a day (BID) | RECTAL | Status: DC | PRN
  Administered 2011-08-30: 25 mg via RECTAL
  Filled 2011-08-30: qty 1

## 2011-08-30 MED ORDER — FENTANYL 10 MCG/ML IV SOLN
INTRAVENOUS | Status: DC
  Administered 2011-08-30: 15:00:00 via INTRAVENOUS
  Administered 2011-08-31: 120 ug/h via INTRAVENOUS
  Administered 2011-08-31: 40 ug/h via INTRAVENOUS
  Filled 2011-08-30: qty 50

## 2011-08-30 NOTE — Progress Notes (Signed)
Fentanyl PCA set up initially at 2:45pm. VSS and pt connected to continuous pulse oximetry. Pt now stating that pain is a lot better and she does not complain of any itching as of yet.  Incision site draining very small amt of serosanguinous fluid, which is bothering pt, so temporary gauze applied to the site.   No other complaints. Will continue to monitor.   Minor, Yvette Rack  RN

## 2011-08-30 NOTE — Progress Notes (Signed)
Clinical Social Work received consult for "SNF." Currently, PT is recommending OP PT. CSW met with pt and husband to address consult, introduced herself and explained role of clinical social work. Pt lives with husband and is planning on returning home at discharge. Pt expressed an interest in home health PT. CSW informed RNCM. Pt and husband did not voice any concerns. CSW is signing off as no further clinical social work needs identified. Please reconsult if a need arises prior to discharge.   Dede Query, MSW, Theresia Majors [phone]

## 2011-08-30 NOTE — Progress Notes (Signed)
Physical Therapy Evaluation Patient Details Name: Yvonne Sanders MRN: [id number] DOB: May 02, 1961 Today's Date: 08/30/2011  Problem List:  Patient Active Problem List  Diagnoses  . Spondylosis of lumbar joint  . Synovial cyst of lumbar facet joint  . Lumbar foraminal stenosis    Past Medical History:  Past Medical History  Diagnosis Date  . Headache   . Arthritis   . Glaucoma    Past Surgical History:  Past Surgical History  Procedure Date  . Back surgery 99  . Cholecystectomy 88  . Diagnostic laparoscopy 88    endometrious  . Appendectomy 75  . Right oophorectomy 75  . Eye surgery 12    laser for glaucoma    PT Assessment/Plan/Recommendation PT Assessment Clinical Impression Statement: pt presents s/p lumbar surgery.  pt may need to be seen 1-2 more times for PT as she is making great progress and has great family support.   PT Recommendation/Assessment: Patient will need skilled PT in the acute care venue PT Problem List: Decreased activity tolerance;Decreased balance;Decreased knowledge of precautions;Pain Barriers to Discharge: None PT Therapy Diagnosis : Difficulty walking;Acute pain PT Plan PT Frequency: Min 5X/week PT Treatment/Interventions: Gait training;Stair training;Functional mobility training;Therapeutic activities;Therapeutic exercise;Balance training;Patient/family education PT Recommendation Follow Up Recommendations: Outpatient PT Equipment Recommended: 3 in 1 bedside comode;Toilet rise with handles (pt to choose if she wants 3-in-1 or toilet riser.  ) PT Goals  Acute Rehab PT Goals PT Goal Formulation: With patient Time For Goal Achievement: 7 days Pt will Roll Supine to Right Side: Independently PT Goal: Rolling Supine to Right Side - Progress: Not met Pt will Roll Supine to Left Side: Independently PT Goal: Rolling Supine to Left Side - Progress: Not met Pt will go Supine/Side to Sit: Independently PT Goal: Supine/Side to Sit - Progress:  Not met Pt will go Sit to Supine/Side: Independently PT Goal: Sit to Supine/Side - Progress: Not met Pt will go Sit to Stand: Independently PT Goal: Sit to Stand - Progress: Not met Pt will Ambulate: >150 feet;Independently PT Goal: Ambulate - Progress: Not met Pt will Go Up / Down Stairs: 1-2 stairs;with supervision PT Goal: Up/Down Stairs - Progress: Not met Additional Goals Additional Goal #1: pt to verbalize 3/3 back precautions.   PT Goal: Additional Goal #1 - Progress: Not met  PT Evaluation Precautions/Restrictions  Precautions Precautions: Back Precaution Booklet Issued: No Required Braces or Orthoses: No Restrictions Weight Bearing Restrictions: No Prior Functioning  Home Living Lives With: Spouse Receives Help From: Family Type of Home: House Home Layout: One level Home Access: Stairs to enter Entrance Stairs-Rails: None Entrance Stairs-Number of Steps: 0 Bathroom Toilet: Standard Home Adaptive Equipment: None Prior Function Level of Independence: Independent with basic ADLs;Independent with homemaking with ambulation;Independent with gait;Independent with transfers Able to Take Stairs?: Yes Driving: Yes Vocation: Full time employment Cognition Cognition Orientation Level: Oriented X4 Sensation/Coordination   Extremity Assessment RLE Assessment RLE Assessment: Within Functional Limits LLE Assessment LLE Assessment: Within Functional Limits Mobility (including Balance) Bed Mobility Bed Mobility: Yes Rolling Right: 5: Supervision Rolling Right Details (indicate cue type and reason): cues for log roll Right Sidelying to Sit: 5: Supervision Right Sidelying to Sit Details (indicate cue type and reason): cues for back precautions and safe technique.   Sitting - Scoot to Edge of Bed: 7: Independent Sit to Supine - Right: 5: Supervision Sit to Supine - Right Details (indicate cue type and reason): cues for safe technique and back  precautions Transfers Transfers: Yes Sit to  Stand: 5: Supervision;With upper extremity assist;From bed Sit to Stand Details (indicate cue type and reason): cues for use of UEs Stand to Sit: 5: Supervision;With upper extremity assist;To bed Stand to Sit Details: cues to control descent Ambulation/Gait Ambulation/Gait: Yes Ambulation/Gait Assistance: 5: Supervision Ambulation/Gait Assistance Details (indicate cue type and reason): cues for back precautions with turns Ambulation Distance (Feet): 150 Feet Assistive device: None Gait Pattern: Step-through pattern;Decreased stride length Stairs: No Wheelchair Mobility Wheelchair Mobility: No    Exercise    End of Session PT - End of Session Equipment Utilized During Treatment: Gait belt Activity Tolerance: Patient tolerated treatment well Patient left: in bed;with call bell in reach;with family/visitor present Nurse Communication: Mobility status for transfers;Mobility status for ambulation General Behavior During Session: [facility] for tasks performed Cognition: [facility] for tasks performed  Sunny Schlein, [place] [phone] 08/30/2011, 10:55 AM

## 2011-08-30 NOTE — Progress Notes (Signed)
Occupational Therapy Evaluation Patient Details Name: Yvonne Sanders MRN: [id number] DOB: 02-28-1961 Today's Date: 08/30/2011  Problem List:  Patient Active Problem List  Diagnoses  . Spondylosis of lumbar joint  . Synovial cyst of lumbar facet joint  . Lumbar foraminal stenosis    Past Medical History:  Past Medical History  Diagnosis Date  . Headache   . Arthritis   . Glaucoma    Past Surgical History:  Past Surgical History  Procedure Date  . Back surgery 99  . Cholecystectomy 88  . Diagnostic laparoscopy 88    endometrious  . Appendectomy 75  . Right oophorectomy 75  . Eye surgery 12    laser for glaucoma    OT Assessment/Plan/Recommendation OT Assessment Clinical Impression Statement: Pt. will benefit from OT to increase independence and safety at D/C home by getting pt. to mod I level with ADLs. OT Recommendation/Assessment: Patient will need skilled OT in the acute care venue OT Problem List: Decreased activity tolerance;Decreased safety awareness;Decreased knowledge of use of DME or AE;Decreased knowledge of precautions Barriers to Discharge: None OT Therapy Diagnosis : Acute pain OT Plan OT Frequency: Min 2X/week OT Treatment/Interventions: Self-care/ADL training;DME and/or AE instruction;Therapeutic activities;Patient/family education;Balance training OT Recommendation Follow Up Recommendations: None Equipment Recommended: 3 in 1 bedside comode Individuals Consulted Consulted and Agree with Results and Recommendations: Patient OT Goals Acute Rehab OT Goals OT Goal Formulation: With patient Time For Goal Achievement: 7 days ADL Goals Pt Will Perform Grooming: with modified independence;Standing at sink ADL Goal: Grooming - Progress: Progressing toward goals Pt Will Perform Lower Body Bathing: with modified independence;Sit to stand from chair ADL Goal: Lower Body Bathing - Progress: Progressing toward goals Pt Will Perform Lower Body Dressing: with  modified independence;Sit to stand from chair;with adaptive equipment ADL Goal: Lower Body Dressing - Progress: Progressing toward goals Pt Will Transfer to Toilet: with modified independence;3-in-1;Ambulation ADL Goal: Toilet Transfer - Progress: Progressing toward goals Pt Will Perform Tub/Shower Transfer: Shower transfer;with DME;Shower seat with back;Grab bars;with supervision ADL Goal: Web designer - Progress: Not met  OT Evaluation Precautions/Restrictions  Precautions Precautions: Back Precaution Booklet Issued: No Required Braces or Orthoses: No Restrictions Weight Bearing Restrictions: No Prior Functioning Home Living Lives With: Spouse Receives Help From: Family Type of Home: House Home Layout: One level Home Access: Stairs to enter Entrance Stairs-Rails: None Entrance Stairs-Number of Steps: 0 Bathroom Shower/Tub: Health visitor: Standard Bathroom Accessibility: Yes How Accessible: Accessible via walker Home Adaptive Equipment: Reacher;Sock aid;Bedside commode/3-in-1 Prior Function Level of Independence: Independent with basic ADLs;Independent with homemaking with ambulation;Independent with gait;Independent with transfers Able to Take Stairs?: Yes Driving: Yes Vocation: Full time employment ADL ADL Eating/Feeding: Simulated;Independent Where Assessed - Eating/Feeding: Chair Grooming: Simulated;Wash/dry hands;Supervision/safety;Set up Where Assessed - Grooming: Standing at sink Upper Body Bathing: Simulated;Chest;Right arm;Left arm;Abdomen;Set up Where Assessed - Upper Body Bathing: Sitting, chair Lower Body Bathing: Simulated;Moderate assistance Lower Body Bathing Details (indicate cue type and reason): Educated pt. on use of long handled sponge to complete Where Assessed - Lower Body Bathing: Sit to stand from chair Upper Body Dressing: Simulated;Set up Upper Body Dressing Details (indicate cue type and reason): Donning gown Where  Assessed - Upper Body Dressing: Sitting, chair Lower Body Dressing: Simulated;Moderate assistance Lower Body Dressing Details (indicate cue type and reason): Educated pt. on use of AE vs. crossing foot over opposite knee to complete LB ADLs. Where Assessed - Lower Body Dressing: Sitting, chair Toilet Transfer: Simulated;Supervision/safety Toilet Transfer Details (indicate cue type and  reason): min verbal cues for technique Toilet Transfer Method: Stand pivot Toilet Transfer Equipment: Other (comment) (straight back chair) Toileting - Clothing Manipulation: Simulated;Minimal assistance Where Assessed - Glass blower/designer Manipulation: Standing Toileting - Hygiene: Performed;Minimal assistance Toileting - Hygiene Details (indicate cue type and reason): Educated pt. on use of toilet hygiene aid to prevent twisting Where Assessed - Toileting Hygiene: Standing Tub/Shower Transfer: Not assessed Tub/Shower Transfer Method: Not assessed ADL Comments: Pt. with increased pain and recently returned from bathroom and requesting limited activity today pt. will benefit from further education on shower transfer and AE for LB ADLs. Vision/Perception  Vision - History Baseline Vision: No visual deficits Patient Visual Report: No change from baseline Cognition Cognition Arousal/Alertness: Awake/alert Overall Cognitive Status: Appears within functional limits for tasks assessed Orientation Level: Oriented X4 Sensation/Coordination Coordination Gross Motor Movements are Fluid and Coordinated: Yes Fine Motor Movements are Fluid and Coordinated: Yes Extremity Assessment RUE Assessment RUE Assessment: Within Functional Limits LUE Assessment LUE Assessment: Within Functional Limits Mobility  Bed Mobility Bed Mobility: Yes Right Sidelying to Sit: 5: Supervision Right Sidelying to Sit Details (indicate cue type and reason): Mod verbal cues for hand positioning and safe technique Sit to Supine - Right:  5: Supervision Sit to Supine - Right Details (indicate cue type and reason): Min verbal cues for technique Transfers Transfers: Yes Sit to Stand: 5: Supervision;With upper extremity assist;From bed Sit to Stand Details (indicate cue type and reason): Min verbal cues for hand placement Exercises   End of Session OT - End of Session Equipment Utilized During Treatment: Gait belt Activity Tolerance: Patient limited by pain Patient left: in bed;with call bell in reach;with family/visitor present Nurse Communication: Mobility status for transfers General Behavior During Session: [facility] for tasks performed Cognition: [facility] for tasks performed   Jadia Capers, OTR/L Pager [phone] 08/30/2011, 2:43 PM

## 2011-08-30 NOTE — Progress Notes (Signed)
Patient ID: Yvonne Sanders, female   DOB: 1960/12/12, 50 y.o.   MRN: [id number] BP 127/77  Pulse 86  Temp(Src) 98.4 F (36.9 C) (Oral)  Resp 20  Ht 5' 5.5" (1.664 m)  Wt 87.091 kg (192 lb)  BMI 31.46 kg/m2  SpO2 98% Alert and oriented x4. Speech clear and fluent. Moving all extremities well, 5/5 lower extremities. Wound clean dry no signs of infection. Doing well post op.  Nauseated, ordered compazine suppository. Zofran not that effective at the moment.  Discontinued the pca- caused itching and she had anaphylatic reaction to narcotic in past. Tramadol is ordered. Given benadryl and pepcid for the skin reaction. Has glaucoma so is unable to receive corticosteroids.

## 2011-08-30 NOTE — Progress Notes (Signed)
Patient ID: Yvonne Sanders, female   DOB: 19-Jan-1961, 50 y.o.   MRN: [id number] BP 142/83  Pulse 108  Temp(Src) 99 F (37.2 C) (Oral)  Resp 18  Ht 5\' 4"  (1.626 m)  Wt 109.9 kg (242 lb 4.6 oz)  BMI 41.59 kg/m2  SpO2 95% Mrs. Kitner is reporting a great deal of pain after surgery. We tried dilaudid pca and the itching was too much to deal with. I will try fentanyl today.  Wound is clean, small amount of bloody discharge. Moving lower extremities well. Continue pt.

## 2011-08-31 MED ORDER — MEPERIDINE HCL 50 MG PO TABS
50.0000 mg | ORAL_TABLET | ORAL | Status: DC | PRN
  Administered 2011-08-31 – 2011-09-01 (×5): 50 mg via ORAL
  Filled 2011-08-31 (×5): qty 1

## 2011-08-31 NOTE — Progress Notes (Signed)
Occupational Therapy Treatment Patient Details Name: Yvonne Sanders MRN: [id number] DOB: 1961-06-12 Today's Date: 08/31/2011  OT Assessment/Plan OT Assessment/Plan Comments on Treatment Session: Pt. moving well and will further benefit from shower transfer education. OT Plan: Discharge plan remains appropriate OT Frequency: Min 2X/week Follow Up Recommendations: None Equipment Recommended: 3 in 1 bedside comode OT Goals Acute Rehab OT Goals OT Goal Formulation: With patient Time For Goal Achievement: 7 days ADL Goals Pt Will Perform Grooming: with modified independence;Standing at sink ADL Goal: Grooming - Progress: Met Pt Will Perform Lower Body Bathing: with modified independence;Sit to stand from chair ADL Goal: Lower Body Bathing - Progress: Met Pt Will Perform Lower Body Dressing: with modified independence;Sit to stand from chair;with adaptive equipment ADL Goal: Lower Body Dressing - Progress: Met Pt Will Transfer to Toilet: with modified independence;3-in-1;Ambulation ADL Goal: Toilet Transfer - Progress: Met Pt Will Perform Tub/Shower Transfer: Shower transfer;with DME;Shower seat with back;Grab bars;with supervision ADL Goal: Web designer - Progress: Not met  OT Treatment Precautions/Restrictions  Precautions Precautions: Back Precaution Booklet Issued: No Required Braces or Orthoses: No Restrictions Weight Bearing Restrictions: No   ADL ADL Grooming: Performed;Independent;Wash/dry hands Where Assessed - Grooming: Standing at sink Lower Body Dressing: Performed;Modified independent Lower Body Dressing Details (indicate cue type and reason): Educated pt. on use of AE vs. crossing foot over opposite knee to complete LB ADLs. Where Assessed - Lower Body Dressing: Sitting, chair Toilet Transfer: Performed;Modified independent Toilet Transfer Method: Proofreader: Regular height toilet;Grab bars Toileting - Clothing Manipulation:  Performed;Modified independent Where Assessed - Toileting Clothing Manipulation: Standing Toileting - Hygiene: Performed;Modified independent Toileting - Hygiene Details (indicate cue type and reason): With toilet hygiene aid Where Assessed - Toileting Hygiene: Standing Tub/Shower Transfer: Simulated;Minimal assistance Tub/Shower Transfer Details (indicate cue type and reason): Mod verbal cues for technique and safety with hand placement and use of grab bar Tub/Shower Transfer Method: Ambulating Tub/Shower Transfer Equipment: Grab bars;Walk in shower;Shower seat with back ADL Comments: Pt. educated on 3/3 back precautions with ADLs and moving well today.  Mobility  Bed Mobility Bed Mobility: Yes Rolling Right: 6: Modified independent (Device/Increase time) Right Sidelying to Sit: 6: Modified independent (Device/Increase time) Sitting - Scoot to Edge of Bed: 7: Independent Transfers Transfers: Yes Sit to Stand: 7: Independent Exercises    End of Session OT - End of Session Equipment Utilized During Treatment: Gait belt Activity Tolerance: Patient tolerated treatment well Patient left: in chair;with call bell in reach Nurse Communication: Mobility status for transfers General Behavior During Session: [facility] for tasks performed Cognition: [facility] for tasks performed  Yailene Badia,OTR/L Pager [phone]  08/31/2011, 1:40 PM

## 2011-08-31 NOTE — Progress Notes (Signed)
Physical Therapy Treatment Patient Details Name: Yvonne Sanders MRN: [id number] DOB: Sep 19, 1960 Today's Date: 08/31/2011  PT Assessment/Plan  PT - Assessment/Plan Comments on Treatment Session: Patient doing very well with mobility and gait.  Should achieve goals with one more session. PT Plan: Frequency remains appropriate;Discharge plan remains appropriate PT Frequency: Min 5X/week Follow Up Recommendations: Outpatient PT Equipment Recommended: None recommended by PT PT Goals  Acute Rehab PT Goals PT Goal: Rolling Supine to Right Side - Progress: Met PT Goal: Rolling Supine to Left Side - Progress: Met PT Goal: Supine/Side to Sit - Progress: Progressing toward goal PT Goal: Sit to Supine/Side - Progress: Progressing toward goal PT Goal: Sit to Stand - Progress: Progressing toward goal PT Goal: Ambulate - Progress: Progressing toward goal PT Goal: Up/Down Stairs - Progress: Discontinued (comment) (Patient declines - only 1 step up into house) Additional Goals PT Goal: Additional Goal #1 - Progress: Met  PT Treatment Precautions/Restrictions  Precautions Precautions: Back Precaution Booklet Issued: No Required Braces or Orthoses: No Restrictions Weight Bearing Restrictions: No Mobility (including Balance) Bed Mobility Bed Mobility: Yes Rolling Right: 6: Modified independent (Device/Increase time);With rail Right Sidelying to Sit: 6: Modified independent (Device/Increase time);With rails;HOB flat Sitting - Scoot to Edge of Bed: 7: Independent Sit to Supine - Right: 6: Modified independent (Device/Increase time);HOB flat;With rail Transfers Transfers: Yes Sit to Stand: 5: Supervision;With upper extremity assist;From bed;From toilet Sit to Stand Details (indicate cue type and reason): Cues for technique from toilet Stand to Sit: 5: Supervision;With upper extremity assist;To toilet;To bed Ambulation/Gait Ambulation/Gait: Yes Ambulation/Gait Assistance: 5:  Supervision Ambulation/Gait Assistance Details (indicate cue type and reason): Cues for back precautions - no twisting when someone behind her talks to her.  Needs to turn body or have person move in front of her. Ambulation Distance (Feet): 380 Feet Assistive device: None Gait Pattern: Step-through pattern;Decreased stride length Gait velocity: Decreased gait speed for safety Stairs: No (Patient declined stairs - has one step into house)    Exercise    End of Session PT - End of Session Equipment Utilized During Treatment: Gait belt Activity Tolerance: Patient tolerated treatment well Patient left: in bed;with call bell in reach;with family/visitor present Nurse Communication: Mobility status for ambulation General Behavior During Session: [facility] for tasks performed Cognition: [facility] for tasks performed  Vena Austria [phone] 08/31/2011, 6:27 PM

## 2011-08-31 NOTE — Progress Notes (Signed)
Patient ID: Yvonne Sanders, female   DOB: 07-21-1961, 50 y.o.   MRN: [id number] Subjective: Patient reports Doing much better  Objective: Vital signs in last 24 hours: Temp:  [98.8 F (37.1 C)-101.2 F (38.4 C)] 98.8 F (37.1 C) (12/29 0934) Pulse Rate:  [104-123] 110  (12/29 0934) Resp:  [16-20] 20  (12/29 0934) BP: (116-153)/(72-87) 146/86 mmHg (12/29 0934) SpO2:  [95 %-100 %] 95 % (12/29 0934)  Intake/Output from previous day: 12/28 0701 - 12/29 0700 In: 880 [I.V.:880] Out: -  Intake/Output this shift: Total I/O In: 360 [P.O.:360] Out: -   Neurologically stable  Lab Results: No results found for this basename: WBC:2,HGB:2,HCT:2,PLT:2 in the last 72 hours BMET No results found for this basename: NA:2,K:2,CL:2,CO2:2,GLUCOSE:2,BUN:2,CREATININE:2,CALCIUM:2 in the last 72 hours  Studies/Results: Dg Lumbar Spine 2-3 Views  08/29/2011  *RADIOLOGY REPORT*  Clinical Data: Interbody fusion at L3-L4.  LUMBAR SPINE - 2-3 VIEW  Comparison: MRI of the lumbar spine performed 06/21/2011  Findings: Two fluoroscopic C-arm images are provided from the OR. These demonstrate successful placement of pedicle screws at L3 and L4, with associated spacer.  The underlying fusion at L4-L5 is again seen.  IMPRESSION: Findings of interbody fusion at L3-L4.  Original Report Authenticated By: Tonia Ghent, M.D.   Dg C-arm Gt 120 Min  08/29/2011  CLINICAL DATA: L 3-4 PLIF   C-ARM GT 120 MIN  Fluoroscopy was utilized by the requesting physician.  No radiographic  interpretation.      Assessment/Plan: Doing well. Will try to switch to oral pain meds  LOS: 2 days  As above   Reinaldo Meeker, MD 08/31/2011, 10:30 AM

## 2011-09-01 MED ORDER — MEPERIDINE HCL 50 MG PO TABS: 50.0000 mg | ORAL_TABLET | ORAL | Status: AC | PRN

## 2011-09-01 NOTE — Discharge Summary (Signed)
Physician Discharge Summary  Patient ID: Yvonne Sanders MRN: [id number] DOB/AGE: [birth year] 50 y.o.  Admit date: 08/29/2011 Discharge date: 09/01/2011  Admission Diagnoses:lumbar stenosis lumbar spondylosis synovial cyst L3/4  Synovial Cyst Left L3/4   Discharge Diagnoses: lumbar stenosis lumbar spondylosis synovial cyst L3/4  Synovial Cyst Left L3/4  Principal Problem:  *Spondylosis of lumbar joint Active Problems:  Synovial cyst of lumbar facet joint  Lumbar foraminal stenosis   Discharged Condition: good  Hospital Course: admitted day of surgery and underwent procedure above - no complication.  Post op pt has done well, increased activity - less leg pain and will be d/c home  Consults: none  Significant Diagnostic Studies: none  Treatments: surgery: PROCEDURE: Procedure(s):  POSTERIOR LUMBAR Interbody FUSION 1 LEVEL nuvasive 10x9x30mm Peek cage L3/4, morsellized autograft  nonsegmental pedicle screw placement, cortical variant  5.47mm 35mm, and 30mm lengths Nuvasive  Posterolateral Arthrodesis L3/4 morsellized allograft  Lumbar decompression beyond what was needed for a PLIF   Discharge Exam: Blood pressure 112/73, pulse 95, temperature 98.7 F (37.1 C), temperature source Oral, resp. rate 18, height 5\' 4"  (1.626 m), weight 109.9 kg (242 lb 4.6 oz), SpO2 100.00%. Incision C/d/i  Disposition: home   Current Discharge Medication List    START taking these medications   Details  meperidine (DEMEROL) 50 MG tablet Take 1 tablet (50 mg total) by mouth every 4 (four) hours as needed for pain (can have 1 or 2). Qty: 41 tablet, Refills: 0      CONTINUE these medications which have NOT CHANGED   Details  Biotin 5000 MCG CAPS Take 5,000 mcg by mouth daily.      carbonyl iron (FEOSOL) 45 MG TABS Take 45 mg by mouth daily.      cyclobenzaprine (FLEXERIL) 10 MG tablet Take 10 mg by mouth 3 (three) times daily as needed. For muscle spasms     ezetimibe (ZETIA) 10 MG  tablet Take 10 mg by mouth daily.      fish oil-omega-3 fatty acids 1000 MG capsule Take 2 g by mouth daily.      folic acid (FOLVITE) 800 MCG tablet Take 400 mcg by mouth daily.      ibuprofen (ADVIL,MOTRIN) 800 MG tablet Take 800 mg by mouth every 8 (eight) hours as needed. For pain     Multiple Vitamins-Minerals (MULTIVITAMINS THER. W/MINERALS) TABS Take 1 tablet by mouth daily.      norethindrone (MICRONOR,CAMILA,ERRIN) 0.35 MG tablet Take 1 tablet by mouth daily.      rosuvastatin (CRESTOR) 40 MG tablet Take 40 mg by mouth daily.      traMADol (ULTRAM) 50 MG tablet Take 50-100 mg by mouth every 6 (six) hours as needed. Maximum dose= 8 tablets per day, for pain          Signed: Clydene Fake, MD 09/01/2011, 9:57 AM

## 2011-09-01 NOTE — Progress Notes (Signed)
Physical Therapy Treatment Patient Details Name: Yvonne Sanders MRN: [id number] DOB: 1961-05-22 Today's Date: 09/01/2011  PT Assessment/Plan  PT - Assessment/Plan Comments on Treatment Session: Patient independent with all mobility/gait.  Recalls 3/3 back precautions.  Achieved all PT goals - will sign off.  Patient ready for discharge from PT perspective. Patient agrees. PT Plan: All goals met and education completed, patient dischaged from PT services Follow Up Recommendations: None (Will readress OP needs at follow-up MD appointment) Equipment Recommended: None recommended by PT PT Goals  Acute Rehab PT Goals PT Goal: Rolling Supine to Right Side - Progress: Met PT Goal: Rolling Supine to Left Side - Progress: Met PT Goal: Supine/Side to Sit - Progress: Met PT Goal: Sit to Supine/Side - Progress: Met PT Goal: Sit to Stand - Progress: Met PT Goal: Ambulate - Progress: Met PT Goal: Up/Down Stairs - Progress: Discontinued (comment) Additional Goals PT Goal: Additional Goal #1 - Progress: Met  PT Treatment Precautions/Restrictions  Precautions Precautions: Back Precaution Booklet Issued: No Required Braces or Orthoses: No Restrictions Weight Bearing Restrictions: No Mobility (including Balance) Bed Mobility Bed Mobility: Yes Rolling Right: 7: Independent Right Sidelying to Sit: 7: Independent;HOB flat Sitting - Scoot to Edge of Bed: 7: Independent Transfers Transfers: Yes Sit to Stand: 7: Independent;With upper extremity assist;From bed;From toilet Stand to Sit: 7: Independent;With upper extremity assist;With armrests;To chair/3-in-1;To toilet Ambulation/Gait Ambulation/Gait: Yes Ambulation/Gait Assistance: 7: Independent Ambulation/Gait Assistance Details (indicate cue type and reason): Good balance and gait speed Ambulation Distance (Feet): 420 Feet Assistive device: None Gait Pattern: Within Functional Limits Gait velocity: Good, safe gait speed    Exercise      End of Session PT - End of Session Activity Tolerance: Patient tolerated treatment well Patient left: in chair;with call bell in reach Nurse Communication: Mobility status for ambulation General Behavior During Session: [facility] for tasks performed Cognition: [facility] for tasks performed  Vena Austria 09/01/2011, 10:06 AM

## 2011-09-02 NOTE — Progress Notes (Signed)
Utilization review completed. Farrell Pantaleo, RN, BSN. 09/02/11  

## 2011-12-24 ENCOUNTER — Other Ambulatory Visit (HOSPITAL_COMMUNITY): Payer: Self-pay | Admitting: Neurosurgery

## 2011-12-24 DIAGNOSIS — M79606 Pain in leg, unspecified: Secondary | ICD-10-CM

## 2011-12-25 ENCOUNTER — Other Ambulatory Visit (HOSPITAL_COMMUNITY): Payer: PRIVATE HEALTH INSURANCE

## 2011-12-25 ENCOUNTER — Ambulatory Visit (HOSPITAL_COMMUNITY)
Admission: RE | Admit: 2011-12-25 | Discharge: 2011-12-25 | Disposition: A | Discharge status: Home / Self Care | Payer: PRIVATE HEALTH INSURANCE | Source: Ambulatory Visit | Attending: Neurosurgery | Admitting: Neurosurgery

## 2011-12-25 ENCOUNTER — Encounter (HOSPITAL_COMMUNITY): Payer: Self-pay

## 2011-12-25 DIAGNOSIS — M51379 Other intervertebral disc degeneration, lumbosacral region without mention of lumbar back pain or lower extremity pain: Secondary | ICD-10-CM | POA: Insufficient documentation

## 2011-12-25 DIAGNOSIS — M545 Low back pain, unspecified: Secondary | ICD-10-CM | POA: Insufficient documentation

## 2011-12-25 DIAGNOSIS — M79609 Pain in unspecified limb: Secondary | ICD-10-CM | POA: Insufficient documentation

## 2011-12-25 DIAGNOSIS — M5137 Other intervertebral disc degeneration, lumbosacral region: Secondary | ICD-10-CM | POA: Insufficient documentation

## 2011-12-25 DIAGNOSIS — M899 Disorder of bone, unspecified: Secondary | ICD-10-CM | POA: Insufficient documentation

## 2011-12-25 DIAGNOSIS — M79606 Pain in leg, unspecified: Secondary | ICD-10-CM

## 2011-12-25 DIAGNOSIS — Z981 Arthrodesis status: Secondary | ICD-10-CM | POA: Insufficient documentation

## 2012-06-09 DIAGNOSIS — H401190 Primary open-angle glaucoma, unspecified eye, stage unspecified: Secondary | ICD-10-CM | POA: Insufficient documentation

## 2012-06-09 DIAGNOSIS — H40009 Preglaucoma, unspecified, unspecified eye: Secondary | ICD-10-CM | POA: Insufficient documentation

## 2013-01-18 ENCOUNTER — Encounter: Payer: Self-pay | Admitting: Obstetrics and Gynecology

## 2013-01-19 ENCOUNTER — Ambulatory Visit (INDEPENDENT_AMBULATORY_CARE_PROVIDER_SITE_OTHER): Payer: PRIVATE HEALTH INSURANCE | Admitting: Obstetrics and Gynecology

## 2013-01-19 ENCOUNTER — Encounter: Payer: Self-pay | Admitting: Obstetrics and Gynecology

## 2013-01-19 VITALS — BP 142/80 | Ht 65.0 in | Wt 209.0 lb

## 2013-01-19 DIAGNOSIS — Z01419 Encounter for gynecological examination (general) (routine) without abnormal findings: Secondary | ICD-10-CM

## 2013-01-19 MED ORDER — SPIRONOLACTONE 100 MG PO TABS: 100.0000 mg | ORAL_TABLET | ORAL | Status: DC | PRN

## 2013-01-19 MED ORDER — NORETHINDRONE 0.35 MG PO TABS: 1.0000 | ORAL_TABLET | Freq: Every day | ORAL | Status: DC

## 2013-01-19 NOTE — Patient Instructions (Signed)

## 2013-01-19 NOTE — Progress Notes (Signed)
52 y.o.   Married    [place] American   female   G1P1001   here for annual exam.  Using micronor and is amenorrheic.  Sees Kellie Shropshire for PCP  Uses spironolactone 2-3 times a month for swelling in her hands.  No LMP recorded. Patient is not currently having periods (Reason: Oral contraceptives).          Sexually active: yes  The current method of family planning is OCP (estrogen/progesterone).    Exercising: occ walking Last mammogram:  12/19/09 neg  Pt promises she will call tomorrow and make an appt!! Last pap smear:01/15/12 History of abnormal pap: 01/15/12 LSIL, 01/02/11 LGSIL, 08/01/10 LGSIL Smoking:never Alcohol:once a month a glass of wine Last colonoscopy:never Last Bone Density: never Last tetanus shot:2006 Last cholesterol check:10/2012 normal 185 on meds  Hgb:  pcp            Urine:pcp   Family History  Problem Relation Age of Onset  . Hypertension Father   . Glaucoma Father   . Cancer Father     prostate cancer  . Renal Disease Father   . Macular degeneration Father   . Breast cancer Maternal Grandmother     Patient Active Problem List   Diagnosis Date Noted  . 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 Synovial cyst of lumbar facet joint 08/29/2011  . Lumbar foraminal stenosis 08/29/2011    Past Medical History  Diagnosis Date  . Headache   . Arthritis   . Glaucoma(365)   . Endometriosis   . Hyperlipidemia   . 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 Abnormal pap 1989    cryo  . Hypertension     pt. denied    Past Surgical History  Procedure Laterality Date  . Back surgery  99    HNP  . Cholecystectomy  88  . Diagnostic laparoscopy  88    endometrious  . Appendectomy  75  . Right oophorectomy  75  . Eye surgery  12    laser for glaucoma  . Breast biopsy      benign  . Lumbar fusion  08/2011    Allergies: Nubain; Corticosteroids; Dilaudid; Hydrocodone; Nubain; Percodan; and Tomato  Current Outpatient Prescriptions  Medication Sig  Dispense Refill  . aspirin 325 MG tablet Take 325 mg by mouth daily.      . Biotin 5000 MCG CAPS Take 5,000 mcg by mouth daily.        . Calcium Carbonate Antacid (TUMS PO) Take by mouth as needed.       . Cholecalciferol (VITAMIN D-3) 5000 UNITS TABS Take by mouth 3 (three) times a week.      . cyclobenzaprine (FLEXERIL) 10 MG tablet Take 10 mg by mouth 3 (three) times daily as needed. For muscle spasms      . ezetimibe (ZETIA) 10 MG tablet Take 10 mg by mouth daily.        . fish oil-omega-3 fatty acids 1000 MG capsule Take 2 g by mouth daily.        . folic acid (FOLVITE) 800 MCG tablet Take 400 mcg by mouth daily.        Marland Kitchen ibuprofen (ADVIL,MOTRIN) 800 MG tablet Take 800 mg by mouth every 8 (eight) hours as needed. For pain       . Multiple Minerals (CALCIUM/MAGNESIUM/ZINC) TABS Take by mouth 2 (two) times a week.      . Multiple Vitamins-Minerals (MULTIVITAMINS THER. W/MINERALS) TABS  Take 1 tablet by mouth daily.        . norethindrone (MICRONOR,CAMILA,ERRIN) 0.35 MG tablet Take 1 tablet by mouth daily.        . rosuvastatin (CRESTOR) 40 MG tablet Take 40 mg by mouth daily.        Marland Kitchen spironolactone (ALDACTONE) 100 MG tablet Take 100 mg by mouth as needed.       . traMADol (ULTRAM) 50 MG tablet Take 50-100 mg by mouth every 6 (six) hours as needed. Maximum dose= 8 tablets per day, for pain       . Vitamins A & D 5000-400 UNITS CAPS Take by mouth.       No current facility-administered medications for this visit.    ROS: Pertinent items are noted in HPI.  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 Wt Readings from Last 3 Encounters:  01/19/13 209 lb (94.802 kg)  08/29/11 242 lb 4.6 oz (109.9 kg)  08/29/11 242 lb 4.6 oz (109.9 kg)     Ht Readings from Last 3 Encounters:  01/19/13 5\' 5"  (1.651 m)  08/29/11 5\' 4"   (1.626 m)  08/29/11 5\' 4"  (1.626 m)    General appearance: alert, cooperative and appears stated age Head: Normocephalic, without obvious abnormality, atraumatic Neck: no adenopathy, supple, symmetrical, trachea midline and thyroid not enlarged, symmetric, no tenderness/mass/nodules Lungs: clear to auscultation bilaterally Breasts: Inspection negative, No nipple retraction or dimpling, No nipple discharge or bleeding, No axillary or supraclavicular adenopathy, Normal to palpation without dominant masses Heart: regular rate and rhythm Abdomen: soft, non-tender; bowel sounds normal; no masses,  no organomegaly Extremities: extremities normal, atraumatic, no cyanosis or edema Skin: Skin color, texture, turgor normal. No rashes or lesions Lymph nodes: Cervical, supraclavicular, and axillary nodes normal. No abnormal inguinal nodes palpated Neurologic: Grossly normal   Pelvic: External genitalia:  no lesions              Urethra:  normal appearing urethra with no masses, tenderness or lesions              Bartholins and Skenes: normal                 Vagina: normal appearing vagina with normal color and discharge, no lesions              Cervix: normal appearance              Pap taken: yes        Bimanual Exam:  Uterus:  uterus is normal size, shape, consistency and nontender, mid, mobile                                      Adnexa: normal adnexa in size, nontender and no masses                                      Rectovaginal: Confirms  Anus:  normal sphincter tone, no lesions  A: normal peri- menopausal exam, micronor for birth control     Long H/O CIN 1 with LEEP in 2007     RSO for endo at age 52 and endo on laparoscopy age 50     cystocele     P: mammogram pap smear counseled on breast self exam, mammography screening, adequate intake of calcium and vitamin D, diet and exercise return annually or prn  RF Micronor and spironolactone   An  After Visit Summary was printed and given to the patient.

## 2013-02-17 ENCOUNTER — Other Ambulatory Visit: Payer: Self-pay

## 2013-02-17 DIAGNOSIS — Z1231 Encounter for screening mammogram for malignant neoplasm of breast: Secondary | ICD-10-CM

## 2013-02-23 ENCOUNTER — Ambulatory Visit
Admission: RE | Admit: 2013-02-23 | Discharge: 2013-02-23 | Disposition: A | Discharge status: Home / Self Care | Payer: PRIVATE HEALTH INSURANCE | Source: Ambulatory Visit

## 2013-02-23 DIAGNOSIS — Z1231 Encounter for screening mammogram for malignant neoplasm of breast: Secondary | ICD-10-CM

## 2013-05-14 ENCOUNTER — Telehealth: Payer: Self-pay | Admitting: Obstetrics and Gynecology

## 2013-05-14 NOTE — Telephone Encounter (Signed)
Spoke with patient about pap results (Ascus HRHPV Neg) repeat pap in 1 year

## 2013-05-14 NOTE — Telephone Encounter (Signed)
LMTCB 05/14/13 cm

## 2013-05-14 NOTE — Telephone Encounter (Signed)
Patient was calling about results for a Pap back in May with doc Romine

## 2013-11-28 ENCOUNTER — Encounter: Payer: Self-pay | Admitting: *Deleted

## 2014-02-07 ENCOUNTER — Other Ambulatory Visit: Payer: Self-pay | Admitting: *Deleted

## 2014-02-07 MED ORDER — NORETHINDRONE 0.35 MG PO TABS: 1.0000 | ORAL_TABLET | Freq: Every day | ORAL | Status: DC

## 2014-02-07 NOTE — Telephone Encounter (Signed)
Last AEX and refill 01/19/13 #1 pack/ 12 refills Next appt 02/18/14  Rx sent for 1 month until appt.

## 2014-02-14 ENCOUNTER — Telehealth: Payer: Self-pay | Admitting: Obstetrics and Gynecology

## 2014-02-14 NOTE — Telephone Encounter (Signed)
Confirming pts appt °

## 2014-02-18 ENCOUNTER — Ambulatory Visit (INDEPENDENT_AMBULATORY_CARE_PROVIDER_SITE_OTHER): Payer: PRIVATE HEALTH INSURANCE | Admitting: Obstetrics and Gynecology

## 2014-02-18 ENCOUNTER — Encounter: Payer: Self-pay | Admitting: Obstetrics and Gynecology

## 2014-02-18 ENCOUNTER — Ambulatory Visit: Payer: PRIVATE HEALTH INSURANCE | Admitting: Obstetrics and Gynecology

## 2014-02-18 VITALS — BP 130/82 | HR 84 | Ht 64.5 in | Wt 204.6 lb

## 2014-02-18 DIAGNOSIS — Z01419 Encounter for gynecological examination (general) (routine) without abnormal findings: Secondary | ICD-10-CM

## 2014-02-18 DIAGNOSIS — Z0000 Encounter for general adult medical examination without abnormal findings: Secondary | ICD-10-CM

## 2014-02-18 LAB — POCT URINALYSIS DIPSTICK
Bilirubin, UA: NEGATIVE
Blood, UA: NEGATIVE
GLUCOSE UA: NEGATIVE
Ketones, UA: NEGATIVE
LEUKOCYTES UA: NEGATIVE
NITRITE UA: NEGATIVE
Protein, UA: NEGATIVE
UROBILINOGEN UA: NEGATIVE
pH, UA: 5

## 2014-02-18 MED ORDER — SPIRONOLACTONE 100 MG PO TABS: 100.0000 mg | ORAL_TABLET | ORAL | Status: DC | PRN

## 2014-02-18 MED ORDER — NORETHINDRONE 0.35 MG PO TABS: 1.0000 | ORAL_TABLET | Freq: Every day | ORAL | Status: DC

## 2014-02-18 NOTE — Patient Instructions (Signed)

## 2014-02-18 NOTE — Progress Notes (Signed)
Patient ID: Yvonne Sanders, female   DOB: 07/13/1961, 53 y.o.   MRN: [id number] GYNECOLOGY VISIT  PCP: Willey Blade, MD  Referring provider:   HPI: 53 y.o.   Married  [place] American  female   G1P1001 with Patient's last menstrual period was 09/03/2007.   here for AEX.   On [place] for contraception and treatment of endometriosis.  Does not want to stop until going on vacation to [place].   Wants an Rx for Spironolactone for LE edema. Has received this in the past form this office.   Father just had hospitalization for cardiac and renal failure. Had pacemaker placed for bradycardia.  Completely blind.  Lives with patient.  Doing well now.   Hgb:    PCP Urine:  Neg  GYNECOLOGIC HISTORY: Patient's last menstrual period was 09/03/2007. Sexually active:  yes Partner preference: female Contraception:   OCP's--Micronor Menopausal hormone therapy: n/a DES exposure:  no  Blood transfusions:no    Sexually transmitted diseases:no    GYN procedures and prior surgeries:  Right ooporectomy, benign breast biopsy Last mammogram:02-23-13 wnl:[facility]                 Last pap and high risk HPV testing:   See below History of abnormal pap smear:  01-19-13 Ascus:neg Hr HPV. History of abnormal pap:01-15-12 LSIL, 01-02-11 LSIL, 08-01-10 LSIL.  Hx of LEEP 2007 revealing CIN I with clear margin.   OB History   Grav Para Term Preterm Abortions TAB SAB Ect Mult Living   1 1 1       1        LIFESTYLE: Exercise:  no             Tobacco:  no Alcohol:   Occ. wine Drug use:  no  OTHER HEALTH MAINTENANCE: Tetanus/TDap:  2006 Gardisil:              n/a Influenza:          06-2013 Zostavax:           n/a  Bone density:    n/a Colonoscopy:    never  Cholesterol check: 10/2013 normal  Family History  Problem Relation Age of Onset  . Hypertension Father   . Glaucoma Father   . Cancer Father     prostate cancer  . Renal Disease Father   . Macular degeneration  Father   . Breast cancer Maternal Grandmother     Patient Active Problem List   Diagnosis Date Noted  . 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 Synovial cyst of lumbar facet joint 08/29/2011  . Lumbar foraminal stenosis 08/29/2011   Past Medical History  Diagnosis Date  . Headache(784.0)   . Arthritis   . Glaucoma   . Endometriosis   . Hyperlipidemia   . 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 Abnormal pap 1989    cryo  . Hypertension     pt. denied    Past Surgical History  Procedure Laterality Date  . Back surgery  99    HNP  . Cholecystectomy  88  . Diagnostic laparoscopy  88    endometrious  . Appendectomy  75  . Right oophorectomy  75  . Eye surgery  12    laser for glaucoma  . Breast biopsy      benign  . Lumbar fusion  08/2011    ALLERGIES: Nubain; Corticosteroids; Dilaudid; Hydrocodone; Nubain; Percodan; and Tomato  Current  Outpatient Prescriptions  Medication Sig Dispense Refill  . aspirin 325 MG tablet Take 325 mg by mouth daily.      . Biotin 5000 MCG CAPS Take 5,000 mcg by mouth daily.        . Calcium Carbonate Antacid (TUMS PO) Take by mouth as needed.       . Cholecalciferol (VITAMIN D-3) 5000 UNITS TABS Take by mouth 3 (three) times a week.      . cyclobenzaprine (FLEXERIL) 10 MG tablet Take 10 mg by mouth 3 (three) times daily as needed. For muscle spasms      . ezetimibe (ZETIA) 10 MG tablet Take 10 mg by mouth daily.        . fish oil-omega-3 fatty acids 1000 MG capsule Take 2 g by mouth daily.        . folic acid (FOLVITE) 960 MCG tablet Take 400 mcg by mouth daily.        Marland Kitchen ibuprofen (ADVIL,MOTRIN) 800 MG tablet Take 800 mg by mouth every 8 (eight) hours as needed. For pain       . Multiple Minerals (CALCIUM/MAGNESIUM/ZINC) TABS Take by mouth 2 (two) times a week.      . Multiple Vitamins-Minerals (MULTIVITAMINS THER. W/MINERALS) TABS Take 1 tablet by mouth daily.        . norethindrone (MICRONOR,CAMILA,ERRIN) 0.35 MG tablet Take 1  tablet (0.35 mg total) by mouth daily.  1 Package  0  . rosuvastatin (CRESTOR) 40 MG tablet Take 40 mg by mouth daily.        Marland Kitchen spironolactone (ALDACTONE) 100 MG tablet Take 1 tablet (100 mg total) by mouth as needed.  30 tablet  1  . traMADol (ULTRAM) 50 MG tablet Take 50-100 mg by mouth every 6 (six) hours as needed. Maximum dose= 8 tablets per day, for pain       . Vitamins A & D 5000-400 UNITS CAPS Take by mouth.       No current facility-administered medications for this visit.     ROS:  Pertinent items are noted in HPI.  SOCIAL HISTORY:  Surgical coordinator.   PHYSICAL EXAMINATION:    BP 130/82  Pulse 84  Ht 5' 4.5" (1.638 m)  Wt 204 lb 9.6 oz (92.806 kg)  BMI 34.59 kg/m2  LMP 09/03/2007   Wt Readings from Last 3 Encounters:  02/18/14 204 lb 9.6 oz (92.806 kg)  01/19/13 209 lb (94.802 kg)  08/29/11 242 lb 4.6 oz (109.9 kg)     Ht Readings from Last 3 Encounters:  02/18/14 5' 4.5" (1.638 m)  01/19/13 5\' 5"  (1.651 m)  08/29/11 5\' 4"  (1.626 m)    General appearance: alert, cooperative and appears stated age Head: Normocephalic, without obvious abnormality, atraumatic Neck: no adenopathy, supple, symmetrical, trachea midline and thyroid not enlarged, symmetric, no tenderness/mass/nodules Lungs: clear to auscultation bilaterally Breasts: Inspection negative, No nipple retraction or dimpling, No nipple discharge or bleeding, No axillary or supraclavicular adenopathy, Normal to palpation without dominant masses Heart: regular rate and rhythm Abdomen: soft, non-tender; no masses,  no organomegaly Extremities: extremities normal, atraumatic, no cyanosis or edema Skin: Skin color, texture, turgor normal. No rashes or lesions Lymph nodes: Cervical, supraclavicular, and axillary nodes normal. No abnormal inguinal nodes palpated Neurologic: Grossly normal  Pelvic: External genitalia:  no lesions              Urethra:  normal appearing urethra with no masses, tenderness or  lesions  Bartholins and Skenes: normal                 Vagina: normal appearing vagina with normal color and discharge, no lesions              Cervix: normal appearance              Pap and high risk HPV testing done: yes.            Bimanual Exam:  Uterus:  uterus is normal size, shape, consistency and nontender                                      Adnexa: normal adnexa in size, nontender and no masses                                      Rectovaginal: Confirms                                      Anus:  normal sphincter tone, no lesions  ASSESSMENT  Normal gynecologic exam. History of LEEP - CIN 1. Cervical stenosis. Lower extremity edema.   PLAN  Mammogram recommended yearly. Patient will schedule. Pap smear and high risk HPV testing performed. Patient wants to call in 10 days to have confirmation of the results.  I welcomed her to do this.  Counseled on self breast exam, Calcium and vitamin D intake, exercise. Camilla refill for only 2 months.  Patient will then stop and have an [place] and estradiol 10 - 14 days after stopping. Spironolactone 100 mg po x 1 for LE edema. Return annually or prn   An After Visit Summary was printed and given to the patient.

## 2014-02-22 LAB — IPS PAP TEST WITH HPV

## 2014-02-24 ENCOUNTER — Telehealth: Payer: Self-pay | Admitting: Obstetrics and Gynecology

## 2014-02-24 NOTE — Telephone Encounter (Signed)
Phone call to discuss pap results. I asked patient to return my call.  No details left.   Patient was concerned that the pap smear did not hurt as much as usual when Dr. Joan Flores performed the testing.  She stated that Dr. Joan Flores always tried really hard to get deep in the cervix. Patient has cervical stenosis.  Patient wants to be sure her pap was adequate.   Has a history a LEEP in 2007 showing CIN I with clear margin.  History of abnormal pap:01-15-12 LSIL, 01-02-11 LSIL, 08-01-10 LSIL. Pap 01-19-13 Ascus with negative no endocervical cells seen and neg HR HPV.   Pap negative for intraepithelial lesion.  Satisfactory for evaluation.  No endocervical cells seen. High risk HPV negative.   Pap is actually improved this year compared to last year.  Neither pap last year with Dr. Joan Flores or with me had endocervical cells noted.  I am comfortable with the pap as the patient has a negative HR HPV status.   If she would like another pap (cervical evaluation), this will require cervical dilation with possible paracervical block and endocervical curettage.

## 2014-03-01 NOTE — Telephone Encounter (Signed)
Attempted work number, as patient called from work number and left her extension number with reception, states surgery scheduling at [facility], no clear indication is this was private voicemail and did not list patient name. Did not leave message. Also, work number not on [employer] so did not leave message.   Attempted mobile number, message advised calling patient with a message from Dr. Quincy Simmonds, please call back at your convenience Olivia Mackie at [phone].

## 2014-03-01 NOTE — Telephone Encounter (Signed)
Patient is calling Dr Quincy Simmonds back.

## 2014-03-01 NOTE — Telephone Encounter (Signed)
Please contact patient with the information regarding her pap result. See the previous note from me.  Thanks.

## 2014-03-02 ENCOUNTER — Telehealth: Payer: Self-pay | Admitting: Obstetrics and Gynecology

## 2014-03-02 NOTE — Telephone Encounter (Signed)
Patient says she is returning a call to [place].

## 2014-03-02 NOTE — Telephone Encounter (Signed)
Please see telephone encounter from 02/24/14 from Dr. Quincy Simmonds. Patient given message. Will close encounter.

## 2014-03-02 NOTE — Telephone Encounter (Addendum)
Patient returned call. She is given message from Dr. Quincy Simmonds.  She states "If Dr. Quincy Simmonds is comfortable with the results then so am I."  She is very thankful for Dr. Quincy Simmonds reaching out to her personally and will follow up prn.   Routing to provider for final review. Patient agreeable to disposition. Will close encounter  02 Recall entered.

## 2014-03-28 ENCOUNTER — Ambulatory Visit (INDEPENDENT_AMBULATORY_CARE_PROVIDER_SITE_OTHER): Payer: PRIVATE HEALTH INSURANCE | Admitting: Interventional Cardiology

## 2014-03-28 ENCOUNTER — Encounter: Payer: Self-pay | Admitting: Interventional Cardiology

## 2014-03-28 VITALS — BP 165/87 | HR 87 | Ht 65.0 in | Wt 205.1 lb

## 2014-03-28 DIAGNOSIS — I10 Essential (primary) hypertension: Secondary | ICD-10-CM

## 2014-03-28 DIAGNOSIS — R030 Elevated blood-pressure reading, without diagnosis of hypertension: Secondary | ICD-10-CM

## 2014-03-28 DIAGNOSIS — IMO0001 Reserved for inherently not codable concepts without codable children: Secondary | ICD-10-CM

## 2014-03-28 DIAGNOSIS — R011 Cardiac murmur, unspecified: Secondary | ICD-10-CM

## 2014-03-28 DIAGNOSIS — I517 Cardiomegaly: Secondary | ICD-10-CM

## 2014-03-28 NOTE — Patient Instructions (Signed)
Your physician recommends that you continue on your current medications as directed. Please refer to the Current Medication list given to you today.  Your physician has requested that you have an echocardiogram. Echocardiography is a painless test that uses sound waves to create images of your heart. It provides your doctor with information about the size and shape of your heart and how well your heart's chambers and valves are working. This procedure takes approximately one hour. There are no restrictions for this procedure.   Your physician recommends that you schedule a follow-up appointment as needed  

## 2014-03-28 NOTE — Progress Notes (Signed)
Patient ID: Yvonne Sanders, female   DOB: 11-09-1960, 53 y.o.   MRN: [id number]   Date: 03/28/2014 ID: Yvonne Sanders, DOB [birth year], MRN [id number] PCP: Salena Saner., MD  Reason: Recent episode of chest discomfort and abnormal EKG at her primary care doctor's office  ASSESSMENT;  1. Voltage criteria for LVH on EKG 2. Family history of hypertension with recent blood pressure elevation 3. Systolic heart murmur  PLAN:  1. 2-D Doppler echocardiogram to assess LV function and rule out LVH 2. Continue aerobic activity 3. Low salt diet and weight reduction   SUBJECTIVE: Yvonne Sanders is a 53 y.o. female who is here for evaluation because of shortness of breath, chest discomfort, and palpitations that occurred in June when under a lot of stress related to illness with her father. Since that time she has had occasional elevation in blood pressure. That time the blood pressure was severely elevated in the 180/100 range. More recently blood pressures have been under 140/90. She is on no medication. She has begun exercising and has no complaints. She has not had syncope, edema, orthopnea, or PND.   Allergies  Allergen Reactions  . Nubain [Nalbuphine Hcl] Anaphylaxis  . Corticosteroids Other (See Comments)    Due to glaucoma can not have steroids  . Dilaudid [Hydromorphone Hcl] Nausea And Vomiting    Severe itching  . Hydrocodone Hives  . Nubain [Nalbuphine Hcl]   . Percodan [Oxycodone-Aspirin] Hives and Itching  . Tomato Rash    Rash, spots on tongue    Current Outpatient Prescriptions on File Prior to Visit  Medication Sig Dispense Refill  . aspirin 325 MG tablet Take 325 mg by mouth daily.      . Biotin 5000 MCG CAPS Take 5,000 mcg by mouth daily.        . Brinzolamide-Brimonidine (SIMBRINZA) 1-0.2 % SUSP Apply 1 drop to eye 2 (two) times daily.      . Calcium Carbonate Antacid (TUMS PO) Take by mouth as needed.       . Carboxymethylcellul-Glycerin 0.5-0.9 % SOLN Apply  1 drop to eye as needed.      . Cholecalciferol (VITAMIN D-3) 5000 UNITS TABS Take by mouth 3 (three) times a week.      . cyclobenzaprine (FLEXERIL) 10 MG tablet Take 10 mg by mouth 3 (three) times daily as needed. For muscle spasms      . Eszopiclone 3 MG TABS Take 1 tablet by mouth as needed.      . ezetimibe (ZETIA) 10 MG tablet Take 10 mg by mouth daily.        . folic acid (FOLVITE) 097 MCG tablet Take 400 mcg by mouth daily.        Marland Kitchen ibuprofen (ADVIL,MOTRIN) 800 MG tablet Take 800 mg by mouth every 8 (eight) hours as needed. For pain       . latanoprost (XALATAN) 0.005 % ophthalmic solution Apply 1 drop to eye daily.      . Multiple Minerals (CALCIUM/MAGNESIUM/ZINC) TABS Take by mouth 2 (two) times a week.      . Multiple Vitamins-Minerals (MULTIVITAMINS THER. W/MINERALS) TABS Take 1 tablet by mouth daily.        . norethindrone (MICRONOR,CAMILA,ERRIN) 0.35 MG tablet Take 1 tablet (0.35 mg total) by mouth daily.  1 Package  1  . rosuvastatin (CRESTOR) 40 MG tablet Take 40 mg by mouth daily.        Marland Kitchen spironolactone (ALDACTONE) 100 MG tablet Take 1 tablet (100 mg  total) by mouth as needed.  30 tablet  1  . traMADol (ULTRAM) 50 MG tablet Take 50-100 mg by mouth every 6 (six) hours as needed. Maximum dose= 8 tablets per day, for pain       . Vitamins A & D 5000-400 UNITS CAPS Take by mouth.       No current facility-administered medications on file prior to visit.    Past Medical History  Diagnosis Date  . Headache(784.0)   . Arthritis   . Glaucoma   . Endometriosis   . Hyperlipidemia   . 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 Abnormal pap 1989    cryo  . Hypertension     pt. denied    Past Surgical History  Procedure Laterality Date  . Back surgery  99    HNP  . Cholecystectomy  88  . Diagnostic laparoscopy  88    endometrious  . Appendectomy  75  . Right oophorectomy  75  . Eye surgery  12    laser for glaucoma  . Breast biopsy      benign  . Lumbar fusion   08/2011    History   Social History  . Marital Status: Married    Spouse Name: N/A    Number of Children: N/A  . Years of Education: N/A   Occupational History  . Not on file.   Social History Main Topics  . Smoking status: Never Smoker   . Smokeless tobacco: Never Used  . Alcohol Use: Yes     Comment: occ. wine  . Drug Use: No  . Sexual Activity: Yes    Partners: Male    Birth Control/ Protection: Pill     Comment: Micronor   Other Topics Concern  . Not on file   Social History Narrative  . No narrative on file    Family History  Problem Relation Age of Onset  . Hypertension Father   . Glaucoma Father   . Cancer Father     prostate cancer  . Renal Disease Father   . Macular degeneration Father   . Breast cancer Maternal Grandmother     ROS: No history of heart murmur. Denies orthopnea. No neurological complaints. Occasional left chest pain that follows left upper thigh discomfort. She feels this may represent a blood clot moving to her lungs. Significant lower back problems with degenerative disc disease and prior surgery. This in some ways limits her physical activities. She denies stroke, syncope, lung disease, and history of pulmonary emboli. Other systems negative for complaints.  OBJECTIVE: BP 165/87  Pulse 87  Ht 5\' 5"  (1.651 m)  Wt 205 lb 1.9 oz (93.042 kg)  BMI 34.13 kg/m2,  General: No acute distress, obese HEENT: normal without pallor or jaundice Neck: JVD flat. Carotids absent Chest: Clear Cardiac: Murmur: 2 of 6 systolic murmur right upper sternal border. Gallop: None. Rhythm: Normal. Other: Normal Abdomen: Bruit: Absent. Pulsation: Absent Extremities: Edema: Absent. Pulses: 2+ and symmetric Neuro: Normal Psych: Normal  ECG: Normal sinus rhythm with prominent voltage in limb lead 1 and Paul wave progression.

## 2014-03-30 ENCOUNTER — Ambulatory Visit (HOSPITAL_COMMUNITY): Payer: PRIVATE HEALTH INSURANCE | Attending: Cardiology | Admitting: Radiology

## 2014-03-30 DIAGNOSIS — R030 Elevated blood-pressure reading, without diagnosis of hypertension: Secondary | ICD-10-CM

## 2014-03-30 DIAGNOSIS — R011 Cardiac murmur, unspecified: Secondary | ICD-10-CM

## 2014-03-30 DIAGNOSIS — I519 Heart disease, unspecified: Secondary | ICD-10-CM | POA: Insufficient documentation

## 2014-03-30 DIAGNOSIS — I517 Cardiomegaly: Secondary | ICD-10-CM

## 2014-03-30 DIAGNOSIS — I10 Essential (primary) hypertension: Secondary | ICD-10-CM

## 2014-03-30 DIAGNOSIS — IMO0001 Reserved for inherently not codable concepts without codable children: Secondary | ICD-10-CM

## 2014-03-30 DIAGNOSIS — R9431 Abnormal electrocardiogram [ECG] [EKG]: Secondary | ICD-10-CM

## 2014-03-30 NOTE — Progress Notes (Signed)
Echocardiogram performed.  

## 2014-04-05 ENCOUNTER — Other Ambulatory Visit (HOSPITAL_COMMUNITY): Payer: PRIVATE HEALTH INSURANCE

## 2014-04-07 ENCOUNTER — Telehealth: Payer: Self-pay

## 2014-04-07 DIAGNOSIS — I10 Essential (primary) hypertension: Secondary | ICD-10-CM

## 2014-04-07 MED ORDER — METOPROLOL SUCCINATE ER 25 MG PO TB24: 25.0000 mg | ORAL_TABLET | Freq: Every day | ORAL | Status: DC

## 2014-04-07 NOTE — Telephone Encounter (Signed)
pt given echo tresults and Dr.Smith instructions Aortic valve sclerosis accounts for the systolic murmur. There is mild concentric hypertrophy suggesting blood pressure is not well controlled      Start metoprolol succinate 25 mg daily. Blood pressure ck in 1 month. Pt verbalized understanding. Pt sts that she is currently out of town and will call back to sch f/u

## 2014-04-07 NOTE — Telephone Encounter (Signed)
Message copied by Lamar Laundry on Thu Apr 07, 2014  4:45 PM ------      Message from: Daneen Schick      Created: Fri Apr 01, 2014  3:08 PM       Aortic valve sclerosis accounts for the systolic murmur.      There is mild concentric hypertrophy suggesting blood pressure is not well controlled      Start metoprolol succinate 25 mg daily. Blood pressure rechecked one month ------

## 2014-04-13 ENCOUNTER — Telehealth: Payer: Self-pay | Admitting: Interventional Cardiology

## 2014-04-13 NOTE — Telephone Encounter (Signed)
New problem    Pt stated she need an appointment within a month per Dr Tamala Julian. There were no appt available. Please advise pt. Pt stated if you call her before 5 please call her at work but after 5 call on cell at [phone].

## 2014-04-14 NOTE — Telephone Encounter (Signed)
pt aware of f/u sch with Dr.Smith 9/15 @ 4pm

## 2014-04-29 ENCOUNTER — Encounter: Payer: Self-pay | Admitting: Obstetrics and Gynecology

## 2014-05-17 ENCOUNTER — Ambulatory Visit: Payer: PRIVATE HEALTH INSURANCE | Admitting: Interventional Cardiology

## 2014-06-09 ENCOUNTER — Telehealth: Payer: Self-pay | Admitting: Interventional Cardiology

## 2014-06-09 NOTE — Telephone Encounter (Signed)
New Message  Pt called requests a call back to discuss receiving a sooner appt. Being that her recall was for September.. Pt declined PA/NP// Please assistr

## 2014-06-09 NOTE — Telephone Encounter (Signed)
returned pt call lmom.

## 2014-06-16 NOTE — Telephone Encounter (Signed)
F/u   Pt want you to call her at work.

## 2014-06-16 NOTE — Telephone Encounter (Signed)
Pt aware of appt scheduled with Dr.Smith in 11/5 @ 4pm

## 2014-06-16 NOTE — Telephone Encounter (Signed)
Follow up  Pt called made appt for November 25. Pt requests a call back to determine if this appt is ok or if a sooner appt is needed. Please call

## 2014-07-04 ENCOUNTER — Encounter: Payer: Self-pay | Admitting: Interventional Cardiology

## 2014-07-07 ENCOUNTER — Encounter: Payer: Self-pay | Admitting: Interventional Cardiology

## 2014-07-07 ENCOUNTER — Ambulatory Visit (INDEPENDENT_AMBULATORY_CARE_PROVIDER_SITE_OTHER): Payer: PRIVATE HEALTH INSURANCE | Admitting: Interventional Cardiology

## 2014-07-07 VITALS — BP 134/80 | HR 72 | Ht 65.0 in | Wt 209.0 lb

## 2014-07-07 DIAGNOSIS — I10 Essential (primary) hypertension: Secondary | ICD-10-CM

## 2014-07-07 DIAGNOSIS — I517 Cardiomegaly: Secondary | ICD-10-CM

## 2014-07-07 MED ORDER — METOPROLOL SUCCINATE ER 25 MG PO TB24: 25.0000 mg | ORAL_TABLET | Freq: Every day | ORAL | Status: DC

## 2014-07-07 NOTE — Patient Instructions (Signed)
Your physician recommends that you continue on your current medications as directed. Please refer to the Current Medication list given to you today.  Your physician wants you to follow-up in: 1 year with Dr.Smith You will receive a reminder letter in the mail two months in advance. If you don't receive a letter, please call our office to schedule the follow-up appointment.  

## 2014-07-07 NOTE — Progress Notes (Signed)
Patient ID: Yvonne Sanders, female   DOB: [birth year], 53 y.o.   MRN: [id number]    [address], [address] [place], [place]  [address] Phone: [phone] Fax:  [phone]  Date:  07/07/2014   ID:  Yvonne Sanders, DOB 1961/02/17, MRN [id number]  PCP:  Salena Saner., MD   ASSESSMENT:  1. Blood pressures under excellent control 2. Obesity 3. Left ventricular hypertrophy, hopefully regressing on blood pressure therapy  PLAN:  1.  Aerobic activity to lose weight 2. Follow-up 1 year 3. No change in therapy   SUBJECTIVE: Yvonne Sanders is a 53 y.o. female who is doing well. Denies angina. No orthopnea PND. Has had transient and family. Her brother-in-law died of liver cancer within the past several months. Her father was hospitalized with heart failure. He has end-stage renal disease, coronary artery disease, hypertension, and congestive heart failure. She has actually done well during this time from a personal standpoint. No dyspnea chest pain. Medications are not causing side effects.   Wt Readings from Last 3 Encounters:  07/07/14 209 lb (94.802 kg)  03/28/14 205 lb 1.9 oz (93.042 kg)  02/18/14 204 lb 9.6 oz (92.806 kg)     Past Medical History  Diagnosis Date  . Headache(784.0)   . Arthritis   . Glaucoma   . Endometriosis   . Hyperlipidemia   . 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 Abnormal pap 1989    cryo  . Hypertension     pt. denied    Current Outpatient Prescriptions  Medication Sig Dispense Refill  . aspirin 325 MG tablet Take 325 mg by mouth daily.    . Biotin 5000 MCG CAPS Take 5,000 mcg by mouth daily.      . Brinzolamide-Brimonidine (SIMBRINZA) 1-0.2 % SUSP Apply 1 drop to eye 2 (two) times daily.    . Calcium Carbonate Antacid (TUMS PO) Take by mouth as needed.     . Carboxymethylcellul-Glycerin 0.5-0.9 % SOLN Apply 1 drop to eye as needed.    . Cholecalciferol (VITAMIN D-3) 5000 UNITS TABS Take by mouth 3 (three) times a week.      . cyclobenzaprine (FLEXERIL) 10 MG tablet Take 10 mg by mouth 3 (three) times daily as needed. For muscle spasms    . Eszopiclone 3 MG TABS Take 1 tablet by mouth as needed.    . ezetimibe (ZETIA) 10 MG tablet Take 10 mg by mouth daily.      . folic acid (FOLVITE) 322 MCG tablet Take 400 mcg by mouth daily.      Marland Kitchen ibuprofen (ADVIL,MOTRIN) 800 MG tablet Take 800 mg by mouth every 8 (eight) hours as needed. For pain     . Krill Oil 300 MG CAPS Take by mouth daily.    Marland Kitchen latanoprost (XALATAN) 0.005 % ophthalmic solution Apply 1 drop to eye daily.    . metoprolol succinate (TOPROL XL) 25 MG 24 hr tablet Take 1 tablet (25 mg total) by mouth daily. 30 tablet 5  . Multiple Minerals (CALCIUM/MAGNESIUM/ZINC) TABS Take by mouth 2 (two) times a week.    . Multiple Vitamins-Minerals (MULTIVITAMINS THER. W/MINERALS) TABS Take 1 tablet by mouth daily.      . rosuvastatin (CRESTOR) 40 MG tablet Take 40 mg by mouth daily.      Marland Kitchen spironolactone (ALDACTONE) 100 MG tablet Take 1 tablet (100 mg total) by mouth as needed. 30 tablet 1  . traMADol (ULTRAM) 50 MG tablet  Take 50-100 mg by mouth every 6 (six) hours as needed. Maximum dose= 8 tablets per day, for pain     . Vitamins A & D 5000-400 UNITS CAPS Take by mouth.    . norethindrone (MICRONOR,CAMILA,ERRIN) 0.35 MG tablet Take 1 tablet (0.35 mg total) by mouth daily. 1 Package 1   No current facility-administered medications for this visit.    Allergies:    Allergies  Allergen Reactions  . Nubain [Nalbuphine Hcl] Anaphylaxis  . Corticosteroids Other (See Comments)    Due to glaucoma can not have steroids  . Dilaudid [Hydromorphone Hcl] Nausea And Vomiting    Severe itching  . Hydrocodone Hives  . Nubain [Nalbuphine Hcl]   . Percodan [Oxycodone-Aspirin] Hives and Itching  . Tomato Rash    Rash, spots on tongue    Social History:  The patient  reports that she has never smoked. She has never used smokeless tobacco. She reports that she drinks alcohol.  She reports that she does not use illicit drugs.   ROS:  Please see the history of present illness.   No edema or orthopnea. Denies palpitations.   All other systems reviewed and negative.   OBJECTIVE: VS:  BP 134/80 mmHg  Pulse 72  Ht 5\' 5"  (1.651 m)  Wt 209 lb (94.802 kg)  BMI 34.78 kg/m2 Well nourished, well developed, in no acute distress, moderate obesity HEENT: normal Neck: JVD flat. Carotid bruit absent  Cardiac:  normal S1, S2; RRR; no murmur Lungs:  clear to auscultation bilaterally, no wheezing, rhonchi or rales Abd: soft, nontender, no hepatomegaly Ext: Edema absent. Pulses 2+ Skin: warm and dry Neuro:  CNs 2-12 intact, no focal abnormalities noted  EKG:  Not performed       Signed, Illene Labrador III, MD 07/07/2014 4:35 PM

## 2014-07-27 ENCOUNTER — Ambulatory Visit: Payer: PRIVATE HEALTH INSURANCE | Admitting: Interventional Cardiology

## 2014-09-22 ENCOUNTER — Encounter: Payer: Self-pay | Admitting: Family Medicine

## 2014-09-22 ENCOUNTER — Ambulatory Visit (INDEPENDENT_AMBULATORY_CARE_PROVIDER_SITE_OTHER): Payer: PRIVATE HEALTH INSURANCE | Admitting: Family Medicine

## 2014-09-22 VITALS — BP 126/70 | HR 78 | Temp 98.4°F | Ht 64.5 in | Wt 207.4 lb

## 2014-09-22 DIAGNOSIS — M5441 Lumbago with sciatica, right side: Secondary | ICD-10-CM

## 2014-09-22 DIAGNOSIS — E785 Hyperlipidemia, unspecified: Secondary | ICD-10-CM

## 2014-09-22 DIAGNOSIS — Z23 Encounter for immunization: Secondary | ICD-10-CM

## 2014-09-22 DIAGNOSIS — Z0000 Encounter for general adult medical examination without abnormal findings: Secondary | ICD-10-CM

## 2014-09-22 DIAGNOSIS — M5442 Lumbago with sciatica, left side: Secondary | ICD-10-CM

## 2014-09-22 LAB — HEPATIC FUNCTION PANEL
ALT: 69 U/L — AB (ref 0–35)
AST: 42 U/L — AB (ref 0–37)
Albumin: 4.3 g/dL (ref 3.5–5.2)
Alkaline Phosphatase: 81 U/L (ref 39–117)
BILIRUBIN INDIRECT: 0.6 mg/dL (ref 0.2–1.2)
BILIRUBIN TOTAL: 0.7 mg/dL (ref 0.2–1.2)
Bilirubin, Direct: 0.1 mg/dL (ref 0.0–0.3)
TOTAL PROTEIN: 7.4 g/dL (ref 6.0–8.3)

## 2014-09-22 LAB — CBC WITH DIFFERENTIAL/PLATELET
BASOS PCT: 0 % (ref 0–1)
Basophils Absolute: 0 10*3/uL (ref 0.0–0.1)
EOS ABS: 0.1 10*3/uL (ref 0.0–0.7)
EOS PCT: 1 % (ref 0–5)
HCT: 38 % (ref 36.0–46.0)
Hemoglobin: 12.4 g/dL (ref 12.0–15.0)
LYMPHS PCT: 39 % (ref 12–46)
Lymphs Abs: 2.7 10*3/uL (ref 0.7–4.0)
MCH: 27.6 pg (ref 26.0–34.0)
MCHC: 32.6 g/dL (ref 30.0–36.0)
MCV: 84.4 fL (ref 78.0–100.0)
MONO ABS: 0.5 10*3/uL (ref 0.1–1.0)
MPV: 11.2 fL (ref 8.6–12.4)
Monocytes Relative: 7 % (ref 3–12)
NEUTROS PCT: 53 % (ref 43–77)
Neutro Abs: 3.7 10*3/uL (ref 1.7–7.7)
PLATELETS: 254 10*3/uL (ref 150–400)
RBC: 4.5 MIL/uL (ref 3.87–5.11)
RDW: 12.8 % (ref 11.5–15.5)
WBC: 6.9 10*3/uL (ref 4.0–10.5)

## 2014-09-22 LAB — BASIC METABOLIC PANEL
BUN: 8 mg/dL (ref 6–23)
CALCIUM: 9.4 mg/dL (ref 8.4–10.5)
CO2: 25 meq/L (ref 19–32)
CREATININE: 0.62 mg/dL (ref 0.50–1.10)
Chloride: 105 mEq/L (ref 96–112)
Glucose, Bld: 87 mg/dL (ref 70–99)
Potassium: 4 mEq/L (ref 3.5–5.3)
Sodium: 140 mEq/L (ref 135–145)

## 2014-09-22 LAB — POCT URINALYSIS DIPSTICK
BILIRUBIN UA: NEGATIVE
GLUCOSE UA: NEGATIVE
Ketones, UA: NEGATIVE
Leukocytes, UA: NEGATIVE
PH UA: 6
Protein, UA: NEGATIVE
RBC UA: NEGATIVE
Spec Grav, UA: 1.025
UROBILINOGEN UA: NEGATIVE

## 2014-09-22 LAB — LIPID PANEL
CHOLESTEROL: 205 mg/dL — AB (ref 0–200)
HDL: 69 mg/dL (ref >39)
LDL CALC: 120 mg/dL — AB (ref 0–99)
TRIGLYCERIDES: 79 mg/dL (ref <150)
Total CHOL/HDL Ratio: 3 Ratio
VLDL: 16 mg/dL (ref 0–40)

## 2014-09-22 LAB — TSH: TSH: 0.517 u[IU]/mL (ref 0.350–4.500)

## 2014-09-22 MED ORDER — EZETIMIBE 10 MG PO TABS: 10.0000 mg | ORAL_TABLET | Freq: Every day | ORAL | Status: DC

## 2014-09-22 MED ORDER — TRAMADOL HCL 50 MG PO TABS: 50.0000 mg | ORAL_TABLET | Freq: Four times a day (QID) | ORAL | Status: DC | PRN

## 2014-09-22 MED ORDER — CYCLOBENZAPRINE HCL 10 MG PO TABS: 10.0000 mg | ORAL_TABLET | Freq: Three times a day (TID) | ORAL | Status: DC | PRN

## 2014-09-22 MED ORDER — ROSUVASTATIN CALCIUM 40 MG PO TABS: 40.0000 mg | ORAL_TABLET | Freq: Every day | ORAL | Status: DC

## 2014-09-22 NOTE — Progress Notes (Signed)
Pre visit review using our clinic review tool, if applicable. No additional management support is needed unless otherwise documented below in the visit note. 

## 2014-09-22 NOTE — Patient Instructions (Signed)
Preventive Care for Adults A healthy lifestyle and preventive care can promote health and wellness. Preventive health guidelines for women include the following key practices.  A routine yearly physical is a good way to check with your health care provider about your health and preventive screening. It is a chance to share any concerns and updates on your health and to receive a thorough exam.  Visit your dentist for a routine exam and preventive care every 6 months. Brush your teeth twice a day and floss once a day. Good oral hygiene prevents tooth decay and gum disease.  The frequency of eye exams is based on your age, health, family medical history, use of contact lenses, and other factors. Follow your health care provider's recommendations for frequency of eye exams.  Eat a healthy diet. Foods like vegetables, fruits, whole grains, low-fat dairy products, and lean protein foods contain the nutrients you need without too many calories. Decrease your intake of foods high in solid fats, added sugars, and salt. Eat the right amount of calories for you.Get information about a proper diet from your health care provider, if necessary.  Regular physical exercise is one of the most important things you can do for your health. Most adults should get at least 150 minutes of moderate-intensity exercise (any activity that increases your heart rate and causes you to sweat) each week. In addition, most adults need muscle-strengthening exercises on 2 or more days a week.  Maintain a healthy weight. The body mass index (BMI) is a screening tool to identify possible weight problems. It provides an estimate of body fat based on height and weight. Your health care provider can find your BMI and can help you achieve or maintain a healthy weight.For adults 20 years and older:  A BMI below 18.5 is considered underweight.  A BMI of 18.5 to 24.9 is normal.  A BMI of 25 to 29.9 is considered overweight.  A BMI of  30 and above is considered obese.  Maintain normal blood lipids and cholesterol levels by exercising and minimizing your intake of saturated fat. Eat a balanced diet with plenty of fruit and vegetables. Blood tests for lipids and cholesterol should begin at age 76 and be repeated every 5 years. If your lipid or cholesterol levels are high, you are over 50, or you are at high risk for heart disease, you may need your cholesterol levels checked more frequently.Ongoing high lipid and cholesterol levels should be treated with medicines if diet and exercise are not working.  If you smoke, find out from your health care provider how to quit. If you do not use tobacco, do not start.  Lung cancer screening is recommended for adults aged 22-80 years who are at high risk for developing lung cancer because of a history of smoking. A yearly low-dose CT scan of the lungs is recommended for people who have at least a 30-pack-year history of smoking and are a current smoker or have quit within the past 15 years. A pack year of smoking is smoking an average of 1 pack of cigarettes a day for 1 year (for example: 1 pack a day for 30 years or 2 packs a day for 15 years). Yearly screening should continue until the smoker has stopped smoking for at least 15 years. Yearly screening should be stopped for people who develop a health problem that would prevent them from having lung cancer treatment.  If you are pregnant, do not drink alcohol. If you are breastfeeding,  be very cautious about drinking alcohol. If you are not pregnant and choose to drink alcohol, do not have more than 1 drink per day. One drink is considered to be 12 ounces (355 mL) of beer, 5 ounces (148 mL) of wine, or 1.5 ounces (44 mL) of liquor.  Avoid use of street drugs. Do not share needles with anyone. Ask for help if you need support or instructions about stopping the use of drugs.  High blood pressure causes heart disease and increases the risk of  stroke. Your blood pressure should be checked at least every 1 to 2 years. Ongoing high blood pressure should be treated with medicines if weight loss and exercise do not work.  If you are 75-52 years old, ask your health care provider if you should take aspirin to prevent strokes.  Diabetes screening involves taking a blood sample to check your fasting blood sugar level. This should be done once every 3 years, after age 15, if you are within normal weight and without risk factors for diabetes. Testing should be considered at a younger age or be carried out more frequently if you are overweight and have at least 1 risk factor for diabetes.  Breast cancer screening is essential preventive care for women. You should practice "breast self-awareness." This means understanding the normal appearance and feel of your breasts and may include breast self-examination. Any changes detected, no matter how small, should be reported to a health care provider. Women in their 58s and 30s should have a clinical breast exam (CBE) by a health care provider as part of a regular health exam every 1 to 3 years. After age 16, women should have a CBE every year. Starting at age 53, women should consider having a mammogram (breast X-ray test) every year. Women who have a family history of breast cancer should talk to their health care provider about genetic screening. Women at a high risk of breast cancer should talk to their health care providers about having an MRI and a mammogram every year.  Breast cancer gene (BRCA)-related cancer risk assessment is recommended for women who have family members with BRCA-related cancers. BRCA-related cancers include breast, ovarian, tubal, and peritoneal cancers. Having family members with these cancers may be associated with an increased risk for harmful changes (mutations) in the breast cancer genes BRCA1 and BRCA2. Results of the assessment will determine the need for genetic counseling and  BRCA1 and BRCA2 testing.  Routine pelvic exams to screen for cancer are no longer recommended for nonpregnant women who are considered low risk for cancer of the pelvic organs (ovaries, uterus, and vagina) and who do not have symptoms. Ask your health care provider if a screening pelvic exam is right for you.  If you have had past treatment for cervical cancer or a condition that could lead to cancer, you need Pap tests and screening for cancer for at least 20 years after your treatment. If Pap tests have been discontinued, your risk factors (such as having a new sexual partner) need to be reassessed to determine if screening should be resumed. Some women have medical problems that increase the chance of getting cervical cancer. In these cases, your health care provider may recommend more frequent screening and Pap tests.  The HPV test is an additional test that may be used for cervical cancer screening. The HPV test looks for the virus that can cause the cell changes on the cervix. The cells collected during the Pap test can be  tested for HPV. The HPV test could be used to screen women aged 30 years and older, and should be used in women of any age who have unclear Pap test results. After the age of 30, women should have HPV testing at the same frequency as a Pap test.  Colorectal cancer can be detected and often prevented. Most routine colorectal cancer screening begins at the age of 50 years and continues through age 75 years. However, your health care provider may recommend screening at an earlier age if you have risk factors for colon cancer. On a yearly basis, your health care provider may provide home test kits to check for hidden blood in the stool. Use of a small camera at the end of a tube, to directly examine the colon (sigmoidoscopy or colonoscopy), can detect the earliest forms of colorectal cancer. Talk to your health care provider about this at age 50, when routine screening begins. Direct  exam of the colon should be repeated every 5-10 years through age 75 years, unless early forms of pre-cancerous polyps or small growths are found.  People who are at an increased risk for hepatitis B should be screened for this virus. You are considered at high risk for hepatitis B if:  You were born in a country where hepatitis B occurs often. Talk with your health care provider about which countries are considered high risk.  Your parents were born in a high-risk country and you have not received a shot to protect against hepatitis B (hepatitis B vaccine).  You have HIV or AIDS.  You use needles to inject street drugs.  You live with, or have sex with, someone who has hepatitis B.  You get hemodialysis treatment.  You take certain medicines for conditions like cancer, organ transplantation, and autoimmune conditions.  Hepatitis C blood testing is recommended for all people born from 1945 through 1965 and any individual with known risks for hepatitis C.  Practice safe sex. Use condoms and avoid high-risk sexual practices to reduce the spread of sexually transmitted infections (STIs). STIs include gonorrhea, chlamydia, syphilis, trichomonas, herpes, HPV, and human immunodeficiency virus (HIV). Herpes, HIV, and HPV are viral illnesses that have no cure. They can result in disability, cancer, and death.  You should be screened for sexually transmitted illnesses (STIs) including gonorrhea and chlamydia if:  You are sexually active and are younger than 24 years.  You are older than 24 years and your health care provider tells you that you are at risk for this type of infection.  Your sexual activity has changed since you were last screened and you are at an increased risk for chlamydia or gonorrhea. Ask your health care provider if you are at risk.  If you are at risk of being infected with HIV, it is recommended that you take a prescription medicine daily to prevent HIV infection. This is  called preexposure prophylaxis (PrEP). You are considered at risk if:  You are a heterosexual woman, are sexually active, and are at increased risk for HIV infection.  You take drugs by injection.  You are sexually active with a partner who has HIV.  Talk with your health care provider about whether you are at high risk of being infected with HIV. If you choose to begin PrEP, you should first be tested for HIV. You should then be tested every 3 months for as long as you are taking PrEP.  Osteoporosis is a disease in which the bones lose minerals and strength   with aging. This can result in serious bone fractures or breaks. The risk of osteoporosis can be identified using a bone density scan. Women ages 65 years and over and women at risk for fractures or osteoporosis should discuss screening with their health care providers. Ask your health care provider whether you should take a calcium supplement or vitamin D to reduce the rate of osteoporosis.  Menopause can be associated with physical symptoms and risks. Hormone replacement therapy is available to decrease symptoms and risks. You should talk to your health care provider about whether hormone replacement therapy is right for you.  Use sunscreen. Apply sunscreen liberally and repeatedly throughout the day. You should seek shade when your shadow is shorter than you. Protect yourself by wearing long sleeves, pants, a wide-brimmed hat, and sunglasses year round, whenever you are outdoors.  Once a month, do a whole body skin exam, using a mirror to look at the skin on your back. Tell your health care provider of new moles, moles that have irregular borders, moles that are larger than a pencil eraser, or moles that have changed in shape or color.  Stay current with required vaccines (immunizations).  Influenza vaccine. All adults should be immunized every year.  Tetanus, diphtheria, and acellular pertussis (Td, Tdap) vaccine. Pregnant women should  receive 1 dose of Tdap vaccine during each pregnancy. The dose should be obtained regardless of the length of time since the last dose. Immunization is preferred during the 27th-36th week of gestation. An adult who has not previously received Tdap or who does not know her vaccine status should receive 1 dose of Tdap. This initial dose should be followed by tetanus and diphtheria toxoids (Td) booster doses every 10 years. Adults with an unknown or incomplete history of completing a 3-dose immunization series with Td-containing vaccines should begin or complete a primary immunization series including a Tdap dose. Adults should receive a Td booster every 10 years.  Varicella vaccine. An adult without evidence of immunity to varicella should receive 2 doses or a second dose if she has previously received 1 dose. Pregnant females who do not have evidence of immunity should receive the first dose after pregnancy. This first dose should be obtained before leaving the health care facility. The second dose should be obtained 4-8 weeks after the first dose.  Human papillomavirus (HPV) vaccine. Females aged 13-26 years who have not received the vaccine previously should obtain the 3-dose series. The vaccine is not recommended for use in pregnant females. However, pregnancy testing is not needed before receiving a dose. If a female is found to be pregnant after receiving a dose, no treatment is needed. In that case, the remaining doses should be delayed until after the pregnancy. Immunization is recommended for any person with an immunocompromised condition through the age of 26 years if she did not get any or all doses earlier. During the 3-dose series, the second dose should be obtained 4-8 weeks after the first dose. The third dose should be obtained 24 weeks after the first dose and 16 weeks after the second dose.  Zoster vaccine. One dose is recommended for adults aged 60 years or older unless certain conditions are  present.  Measles, mumps, and rubella (MMR) vaccine. Adults born before 1957 generally are considered immune to measles and mumps. Adults born in 1957 or later should have 1 or more doses of MMR vaccine unless there is a contraindication to the vaccine or there is laboratory evidence of immunity to   each of the three diseases. A routine second dose of MMR vaccine should be obtained at least 28 days after the first dose for students attending postsecondary schools, health care workers, or international travelers. People who received inactivated measles vaccine or an unknown type of measles vaccine during 1963-1967 should receive 2 doses of MMR vaccine. People who received inactivated mumps vaccine or an unknown type of mumps vaccine before 1979 and are at high risk for mumps infection should consider immunization with 2 doses of MMR vaccine. For females of childbearing age, rubella immunity should be determined. If there is no evidence of immunity, females who are not pregnant should be vaccinated. If there is no evidence of immunity, females who are pregnant should delay immunization until after pregnancy. Unvaccinated health care workers born before 1957 who lack laboratory evidence of measles, mumps, or rubella immunity or laboratory confirmation of disease should consider measles and mumps immunization with 2 doses of MMR vaccine or rubella immunization with 1 dose of MMR vaccine.  Pneumococcal 13-valent conjugate (PCV13) vaccine. When indicated, a person who is uncertain of her immunization history and has no record of immunization should receive the PCV13 vaccine. An adult aged 19 years or older who has certain medical conditions and has not been previously immunized should receive 1 dose of PCV13 vaccine. This PCV13 should be followed with a dose of pneumococcal polysaccharide (PPSV23) vaccine. The PPSV23 vaccine dose should be obtained at least 8 weeks after the dose of PCV13 vaccine. An adult aged 19  years or older who has certain medical conditions and previously received 1 or more doses of PPSV23 vaccine should receive 1 dose of PCV13. The PCV13 vaccine dose should be obtained 1 or more years after the last PPSV23 vaccine dose.  Pneumococcal polysaccharide (PPSV23) vaccine. When PCV13 is also indicated, PCV13 should be obtained first. All adults aged 65 years and older should be immunized. An adult younger than age 65 years who has certain medical conditions should be immunized. Any person who resides in a nursing home or long-term care facility should be immunized. An adult smoker should be immunized. People with an immunocompromised condition and certain other conditions should receive both PCV13 and PPSV23 vaccines. People with human immunodeficiency virus (HIV) infection should be immunized as soon as possible after diagnosis. Immunization during chemotherapy or radiation therapy should be avoided. Routine use of PPSV23 vaccine is not recommended for American Indians, [place] Natives, or people younger than 65 years unless there are medical conditions that require PPSV23 vaccine. When indicated, people who have unknown immunization and have no record of immunization should receive PPSV23 vaccine. One-time revaccination 5 years after the first dose of PPSV23 is recommended for people aged 19-64 years who have chronic kidney failure, nephrotic syndrome, asplenia, or immunocompromised conditions. People who received 1-2 doses of PPSV23 before age 65 years should receive another dose of PPSV23 vaccine at age 65 years or later if at least 5 years have passed since the previous dose. Doses of PPSV23 are not needed for people immunized with PPSV23 at or after age 65 years.  Meningococcal vaccine. Adults with asplenia or persistent complement component deficiencies should receive 2 doses of quadrivalent meningococcal conjugate (MenACWY-D) vaccine. The doses should be obtained at least 2 months apart.  Microbiologists working with certain meningococcal bacteria, military recruits, people at risk during an outbreak, and people who travel to or live in countries with a high rate of meningitis should be immunized. A first-year college student up through age   21 years who is living in a residence hall should receive a dose if she did not receive a dose on or after her 16th birthday. Adults who have certain high-risk conditions should receive one or more doses of vaccine.  Hepatitis A vaccine. Adults who wish to be protected from this disease, have certain high-risk conditions, work with hepatitis A-infected animals, work in hepatitis A research labs, or travel to or work in countries with a high rate of hepatitis A should be immunized. Adults who were previously unvaccinated and who anticipate close contact with an international adoptee during the first 60 days after arrival in the [place] from a country with a high rate of hepatitis A should be immunized.  Hepatitis B vaccine. Adults who wish to be protected from this disease, have certain high-risk conditions, may be exposed to blood or other infectious body fluids, are household contacts or sex partners of hepatitis B positive people, are clients or workers in certain care facilities, or travel to or work in countries with a high rate of hepatitis B should be immunized.  Haemophilus influenzae type b (Hib) vaccine. A previously unvaccinated person with asplenia or sickle cell disease or having a scheduled splenectomy should receive 1 dose of Hib vaccine. Regardless of previous immunization, a recipient of a hematopoietic stem cell transplant should receive a 3-dose series 6-12 months after her successful transplant. Hib vaccine is not recommended for adults with HIV infection. Preventive Services / Frequency Ages 64 to 68 years  Blood pressure check.** / Every 1 to 2 years.  Lipid and cholesterol check.** / Every 5 years beginning at age  22.  Clinical breast exam.** / Every 3 years for women in their 88s and 53s.  BRCA-related cancer risk assessment.** / For women who have family members with a BRCA-related cancer (breast, ovarian, tubal, or peritoneal cancers).  Pap test.** / Every 2 years from ages 90 through 51. Every 3 years starting at age 21 through age 56 or 3 with a history of 3 consecutive normal Pap tests.  HPV screening.** / Every 3 years from ages 24 through ages 1 to 46 with a history of 3 consecutive normal Pap tests.  Hepatitis C blood test.** / For any individual with known risks for hepatitis C.  Skin self-exam. / Monthly.  Influenza vaccine. / Every year.  Tetanus, diphtheria, and acellular pertussis (Tdap, Td) vaccine.** / Consult your health care provider. Pregnant women should receive 1 dose of Tdap vaccine during each pregnancy. 1 dose of Td every 10 years.  Varicella vaccine.** / Consult your health care provider. Pregnant females who do not have evidence of immunity should receive the first dose after pregnancy.  HPV vaccine. / 3 doses over 6 months, if 72 and younger. The vaccine is not recommended for use in pregnant females. However, pregnancy testing is not needed before receiving a dose.  Measles, mumps, rubella (MMR) vaccine.** / You need at least 1 dose of MMR if you were born in 1957 or later. You may also need a 2nd dose. For females of childbearing age, rubella immunity should be determined. If there is no evidence of immunity, females who are not pregnant should be vaccinated. If there is no evidence of immunity, females who are pregnant should delay immunization until after pregnancy.  Pneumococcal 13-valent conjugate (PCV13) vaccine.** / Consult your health care provider.  Pneumococcal polysaccharide (PPSV23) vaccine.** / 1 to 2 doses if you smoke cigarettes or if you have certain conditions.  Meningococcal vaccine.** /  1 dose if you are age 19 to 21 years and a first-year college  student living in a residence hall, or have one of several medical conditions, you need to get vaccinated against meningococcal disease. You may also need additional booster doses.  Hepatitis A vaccine.** / Consult your health care provider.  Hepatitis B vaccine.** / Consult your health care provider.  Haemophilus influenzae type b (Hib) vaccine.** / Consult your health care provider. Ages 40 to 64 years  Blood pressure check.** / Every 1 to 2 years.  Lipid and cholesterol check.** / Every 5 years beginning at age 20 years.  Lung cancer screening. / Every year if you are aged 55-80 years and have a 30-pack-year history of smoking and currently smoke or have quit within the past 15 years. Yearly screening is stopped once you have quit smoking for at least 15 years or develop a health problem that would prevent you from having lung cancer treatment.  Clinical breast exam.** / Every year after age 40 years.  BRCA-related cancer risk assessment.** / For women who have family members with a BRCA-related cancer (breast, ovarian, tubal, or peritoneal cancers).  Mammogram.** / Every year beginning at age 40 years and continuing for as long as you are in good health. Consult with your health care provider.  Pap test.** / Every 3 years starting at age 30 years through age 65 or 70 years with a history of 3 consecutive normal Pap tests.  HPV screening.** / Every 3 years from ages 30 years through ages 65 to 70 years with a history of 3 consecutive normal Pap tests.  Fecal occult blood test (FOBT) of stool. / Every year beginning at age 50 years and continuing until age 75 years. You may not need to do this test if you get a colonoscopy every 10 years.  Flexible sigmoidoscopy or colonoscopy.** / Every 5 years for a flexible sigmoidoscopy or every 10 years for a colonoscopy beginning at age 50 years and continuing until age 75 years.  Hepatitis C blood test.** / For all people born from 1945 through  1965 and any individual with known risks for hepatitis C.  Skin self-exam. / Monthly.  Influenza vaccine. / Every year.  Tetanus, diphtheria, and acellular pertussis (Tdap/Td) vaccine.** / Consult your health care provider. Pregnant women should receive 1 dose of Tdap vaccine during each pregnancy. 1 dose of Td every 10 years.  Varicella vaccine.** / Consult your health care provider. Pregnant females who do not have evidence of immunity should receive the first dose after pregnancy.  Zoster vaccine.** / 1 dose for adults aged 60 years or older.  Measles, mumps, rubella (MMR) vaccine.** / You need at least 1 dose of MMR if you were born in 1957 or later. You may also need a 2nd dose. For females of childbearing age, rubella immunity should be determined. If there is no evidence of immunity, females who are not pregnant should be vaccinated. If there is no evidence of immunity, females who are pregnant should delay immunization until after pregnancy.  Pneumococcal 13-valent conjugate (PCV13) vaccine.** / Consult your health care provider.  Pneumococcal polysaccharide (PPSV23) vaccine.** / 1 to 2 doses if you smoke cigarettes or if you have certain conditions.  Meningococcal vaccine.** / Consult your health care provider.  Hepatitis A vaccine.** / Consult your health care provider.  Hepatitis B vaccine.** / Consult your health care provider.  Haemophilus influenzae type b (Hib) vaccine.** / Consult your health care provider. Ages 65   years and over  Blood pressure check.** / Every 1 to 2 years.  Lipid and cholesterol check.** / Every 5 years beginning at age 22 years.  Lung cancer screening. / Every year if you are aged 73-80 years and have a 30-pack-year history of smoking and currently smoke or have quit within the past 15 years. Yearly screening is stopped once you have quit smoking for at least 15 years or develop a health problem that would prevent you from having lung cancer  treatment.  Clinical breast exam.** / Every year after age 4 years.  BRCA-related cancer risk assessment.** / For women who have family members with a BRCA-related cancer (breast, ovarian, tubal, or peritoneal cancers).  Mammogram.** / Every year beginning at age 40 years and continuing for as long as you are in good health. Consult with your health care provider.  Pap test.** / Every 3 years starting at age 9 years through age 34 or 91 years with 3 consecutive normal Pap tests. Testing can be stopped between 65 and 70 years with 3 consecutive normal Pap tests and no abnormal Pap or HPV tests in the past 10 years.  HPV screening.** / Every 3 years from ages 57 years through ages 64 or 45 years with a history of 3 consecutive normal Pap tests. Testing can be stopped between 65 and 70 years with 3 consecutive normal Pap tests and no abnormal Pap or HPV tests in the past 10 years.  Fecal occult blood test (FOBT) of stool. / Every year beginning at age 15 years and continuing until age 17 years. You may not need to do this test if you get a colonoscopy every 10 years.  Flexible sigmoidoscopy or colonoscopy.** / Every 5 years for a flexible sigmoidoscopy or every 10 years for a colonoscopy beginning at age 86 years and continuing until age 71 years.  Hepatitis C blood test.** / For all people born from [birth year] through 1965 and any individual with known risks for hepatitis C.  Osteoporosis screening.** / A one-time screening for women ages 83 years and over and women at risk for fractures or osteoporosis.  Skin self-exam. / Monthly.  Influenza vaccine. / Every year.  Tetanus, diphtheria, and acellular pertussis (Tdap/Td) vaccine.** / 1 dose of Td every 10 years.  Varicella vaccine.** / Consult your health care provider.  Zoster vaccine.** / 1 dose for adults aged 61 years or older.  Pneumococcal 13-valent conjugate (PCV13) vaccine.** / Consult your health care provider.  Pneumococcal  polysaccharide (PPSV23) vaccine.** / 1 dose for all adults aged 28 years and older.  Meningococcal vaccine.** / Consult your health care provider.  Hepatitis A vaccine.** / Consult your health care provider.  Hepatitis B vaccine.** / Consult your health care provider.  Haemophilus influenzae type b (Hib) vaccine.** / Consult your health care provider. ** Family history and personal history of risk and conditions may change your health care provider's recommendations. Document Released: 10/15/2001 Document Revised: 01/03/2014 Document Reviewed: 01/14/2011 [facility] Patient Information 2015 [place], [place]. This information is not intended to replace advice given to you by your health care provider. Make sure you discuss any questions you have with your health care provider.

## 2014-09-22 NOTE — Progress Notes (Signed)
Subjective:     Yvonne Sanders is a 54 y.o. female and is here for a comprehensive physical exam. The patient reports no problems.  History   Social History  . Marital Status: Married    Spouse Name: N/A    Number of Children: N/A  . Years of Education: N/A   Occupational History  . Not on file.   Social History Main Topics  . Smoking status: Never Smoker   . Smokeless tobacco: Never Used  . Alcohol Use: Yes     Comment: occ. wine  . Drug Use: No  . Sexual Activity:    Partners: Male    Birth Control/ Protection: Pill     Comment: Micronor   Other Topics Concern  . Not on file   Social History Narrative   Health Maintenance  Topic Date Due  . COLONOSCOPY  05/21/2011  . TETANUS/TDAP  07/03/2014  . INFLUENZA VACCINE  09/23/2015 (Originally 04/02/2014)  . MAMMOGRAM  02/24/2015  . PAP SMEAR  02/18/2017    The following portions of the patient's history were reviewed and updated as appropriate:  She  has a past medical history of Headache(784.0); Arthritis; Glaucoma; Endometriosis; Hyperlipidemia; Fibroid (1994); Abnormal pap (1989); Hypertension; Chicken pox; Migraine; and Positive TB test. She  does not have any pertinent problems on file. She  has past surgical history that includes Back surgery (99); Cholecystectomy (88); Diagnostic laparoscopy (88); Appendectomy (75); Right oophorectomy (75); Eye surgery (12); Breast biopsy; Lumbar fusion (08/2011); and Lumbar fusion. Her family history includes Breast cancer in her maternal aunt and maternal grandmother; Glaucoma in her father; Hypertension in her father; Macular degeneration in her father; Prostate cancer in her father; Renal Disease in her father; Stroke in her father. She  reports that she has never smoked. She has never used smokeless tobacco. She reports that she drinks alcohol. She reports that she does not use illicit drugs. She has a current medication list which includes the following prescription(s):  aspirin, biotin, brinzolamide-brimonidine, calcium carbonate antacid, carboxymethylcellul-glycerin, vitamin d-3, cyclobenzaprine, ezetimibe, folic acid, ibuprofen, krill oil, latanoprost, metoprolol succinate, multivitamins ther. w/minerals, rosuvastatin, spironolactone, tramadol, and eszopiclone. Current Outpatient Prescriptions on File Prior to Visit  Medication Sig Dispense Refill  . aspirin 325 MG tablet Take 325 mg by mouth daily.    . Biotin 5000 MCG CAPS Take 5,000 mcg by mouth daily.      . Brinzolamide-Brimonidine (SIMBRINZA) 1-0.2 % SUSP Apply 1 drop to eye 2 (two) times daily.    . Calcium Carbonate Antacid (TUMS PO) Take by mouth as needed.     . Carboxymethylcellul-Glycerin 0.5-0.9 % SOLN Apply 1 drop to eye as needed.    . Cholecalciferol (VITAMIN D-3) 5000 UNITS TABS Take by mouth 3 (three) times a week.    . folic acid (FOLVITE) 829 MCG tablet Take 400 mcg by mouth daily.      Marland Kitchen ibuprofen (ADVIL,MOTRIN) 800 MG tablet Take 800 mg by mouth every 8 (eight) hours as needed. For pain     . Krill Oil 300 MG CAPS Take by mouth daily.    Marland Kitchen latanoprost (XALATAN) 0.005 % ophthalmic solution Apply 1 drop to eye daily.    . metoprolol succinate (TOPROL XL) 25 MG 24 hr tablet Take 1 tablet (25 mg total) by mouth daily. 30 tablet 11  . Multiple Vitamins-Minerals (MULTIVITAMINS THER. W/MINERALS) TABS Take 1 tablet by mouth daily.      Marland Kitchen spironolactone (ALDACTONE) 100 MG tablet Take 1 tablet (100 mg  total) by mouth as needed. 30 tablet 1  . Eszopiclone 3 MG TABS Take 1 tablet by mouth as needed.     No current facility-administered medications on file prior to visit.   She is allergic to nubain; corticosteroids; dilaudid; hydrocodone; nubain; percodan; and tomato..  Review of Systems Review of Systems  Constitutional: Negative for activity change, appetite change and fatigue.  HENT: Negative for hearing loss, congestion, tinnitus and ear discharge.  dentist q80m Eyes: Negative for visual  disturbance (see optho q1y -- vision corrected to 20/20 with glasses).  Respiratory: Negative for cough, chest tightness and shortness of breath.   Cardiovascular: Negative for chest pain, palpitations and leg swelling.  Gastrointestinal: Negative for abdominal pain, diarrhea, constipation and abdominal distention.  Genitourinary: Negative for urgency, frequency, decreased urine volume and difficulty urinating.  Musculoskeletal: Negative for back pain, arthralgias and gait problem.  Skin: Negative for color change, pallor and rash.  Neurological: Negative for dizziness, light-headedness, numbness and headaches.  Hematological: Negative for adenopathy. Does not bruise/bleed easily.  Psychiatric/Behavioral: Negative for suicidal ideas, confusion, sleep disturbance, self-injury, dysphoric mood, decreased concentration and agitation.       Objective:    BP 126/70 mmHg  Pulse 78  Temp(Src) 98.4 F (36.9 C) (Oral)  Ht 5' 4.5" (1.638 m)  Wt 207 lb 6.4 oz (94.076 kg)  BMI 35.06 kg/m2  SpO2 98%  LMP 09/03/2007 General appearance: alert, cooperative and appears stated age Head: Normocephalic, without obvious abnormality, atraumatic Eyes: conjunctivae/corneas clear. PERRL, EOM's intact. Fundi benign. Ears: normal TM's and external ear canals both ears Nose: Nares normal. Septum midline. Mucosa normal. No drainage or sinus tenderness. Throat: lips, mucosa, and tongue normal; teeth and gums normal Neck: no adenopathy, no carotid bruit, no JVD, supple, symmetrical, trachea midline and thyroid not enlarged, symmetric, no tenderness/mass/nodules Back: symmetric, no curvature. ROM normal. No CVA tenderness. Lungs: clear to auscultation bilaterally Breasts: gyn Heart: S1, S2 normal --+ murmur Abdomen: soft, non-tender; bowel sounds normal; no masses,  no organomegaly Pelvic: deferred --gyn Extremities: extremities normal, atraumatic, no cyanosis or edema Pulses: 2+ and symmetric Skin: Skin  color, texture, turgor normal. No rashes or lesions Lymph nodes: Cervical, supraclavicular, and axillary nodes normal. Neurologic: Alert and oriented X 3, normal strength and tone. Normal symmetric reflexes. Normal coordination and gait Psych--no depression no anxiety      Assessment:    Healthy female exam.      Plan:    ghm utd Check labs See After Visit Summary for Counseling Recommendations    1. Bilateral low back pain with sciatica, sciatica laterality unspecified  - traMADol (ULTRAM) 50 MG tablet; Take 1-2 tablets (50-100 mg total) by mouth every 6 (six) hours as needed. Maximum dose= 8 tablets per day, for pain  Dispense: 30 tablet; Refill: 2 - cyclobenzaprine (FLEXERIL) 10 MG tablet; Take 1 tablet (10 mg total) by mouth 3 (three) times daily as needed. For muscle spasms  Dispense: 30 tablet; Refill: 2 - CBC with Differential  2. Hyperlipidemia Check labs - rosuvastatin (CRESTOR) 40 MG tablet; Take 1 tablet (40 mg total) by mouth daily.  Dispense: 30 tablet; Refill: 2 - ezetimibe (ZETIA) 10 MG tablet; Take 1 tablet (10 mg total) by mouth daily.  Dispense: 30 tablet; Refill: 2 - POCT urinalysis dipstick - Basic Metabolic Panel (BMET) - Lipid panel  3. Preventative health care  - POCT urinalysis dipstick - Ambulatory referral to Gastroenterology - CBC with Differential - Hepatic function panel - TSH  4. Need  for diphtheria-tetanus-pertussis (Tdap) vaccine, adult/adolescent  - Tdap vaccine greater than or equal to 7yo IM

## 2014-09-23 ENCOUNTER — Encounter: Payer: Self-pay | Admitting: Family Medicine

## 2014-09-27 ENCOUNTER — Telehealth: Payer: Self-pay | Admitting: *Deleted

## 2014-09-27 NOTE — Telephone Encounter (Signed)
Received medical records via fax from [facility]. JG//CMA

## 2014-10-06 ENCOUNTER — Other Ambulatory Visit: Payer: Self-pay

## 2014-10-06 NOTE — Telephone Encounter (Signed)
Error.     KP 

## 2014-10-20 ENCOUNTER — Other Ambulatory Visit: Payer: Self-pay

## 2014-10-20 DIAGNOSIS — Z1231 Encounter for screening mammogram for malignant neoplasm of breast: Secondary | ICD-10-CM

## 2014-10-26 ENCOUNTER — Ambulatory Visit
Admission: RE | Admit: 2014-10-26 | Discharge: 2014-10-26 | Disposition: A | Discharge status: Home / Self Care | Payer: PRIVATE HEALTH INSURANCE | Source: Ambulatory Visit

## 2014-10-26 DIAGNOSIS — Z1231 Encounter for screening mammogram for malignant neoplasm of breast: Secondary | ICD-10-CM

## 2014-11-15 DIAGNOSIS — H43819 Vitreous degeneration, unspecified eye: Secondary | ICD-10-CM | POA: Insufficient documentation

## 2014-12-26 ENCOUNTER — Other Ambulatory Visit: Payer: Self-pay | Admitting: Internal Medicine

## 2014-12-26 ENCOUNTER — Ambulatory Visit
Admission: RE | Admit: 2014-12-26 | Discharge: 2014-12-26 | Disposition: A | Discharge status: Home / Self Care | Payer: PRIVATE HEALTH INSURANCE | Source: Ambulatory Visit | Attending: Internal Medicine | Admitting: Internal Medicine

## 2014-12-26 ENCOUNTER — Ambulatory Visit: Payer: PRIVATE HEALTH INSURANCE | Admitting: Physician Assistant

## 2014-12-26 DIAGNOSIS — Z0289 Encounter for other administrative examinations: Secondary | ICD-10-CM

## 2014-12-26 DIAGNOSIS — M79672 Pain in left foot: Secondary | ICD-10-CM

## 2015-01-16 ENCOUNTER — Other Ambulatory Visit: Payer: Self-pay | Admitting: *Deleted

## 2015-01-16 MED ORDER — SPIRONOLACTONE 100 MG PO TABS: 100.0000 mg | ORAL_TABLET | ORAL | Status: DC | PRN

## 2015-01-16 NOTE — Telephone Encounter (Signed)
Medication refill request: Spironolactone 100 mg  Last AEX: 02/18/14 with BS  Next AEX: 02/24/15 with BS  Last MMG (if hormonal medication request): n/a  Refill authorized: #[id number] rfs, please advise.

## 2015-02-24 ENCOUNTER — Encounter: Payer: Self-pay | Admitting: Obstetrics and Gynecology

## 2015-02-24 ENCOUNTER — Ambulatory Visit (INDEPENDENT_AMBULATORY_CARE_PROVIDER_SITE_OTHER): Payer: PRIVATE HEALTH INSURANCE | Admitting: Obstetrics and Gynecology

## 2015-02-24 ENCOUNTER — Ambulatory Visit: Payer: PRIVATE HEALTH INSURANCE | Admitting: Obstetrics and Gynecology

## 2015-02-24 VITALS — BP 110/60 | HR 68 | Resp 16 | Ht 64.75 in | Wt 204.0 lb

## 2015-02-24 DIAGNOSIS — Z01419 Encounter for gynecological examination (general) (routine) without abnormal findings: Secondary | ICD-10-CM

## 2015-02-24 NOTE — Progress Notes (Signed)
54 y.o. [id number] Married [place] American female here for annual exam.    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  Father lives with patient.  He is blind.  Sister also helps.  Battling weight. Not wanting to address actively other than diet and exercise.  Plantar fascitis. Using orthotics.  Now taking Metoprolol for HTN.   This is new. Asking for Spironolactone for LE edema.   PCP:   Garnet Koyanagi   Patient's last menstrual period was 09/03/2007.          Sexually active: Yes.    The current method of family planning is post menopausal status.    Exercising: No.  The patient does not participate in regular exercise at present. Smoker:  no  Health Maintenance: Pap:  02/18/14 neg. HR HPV:neg History of abnormal Pap:  Yes, previous LGSIL 2014  MMG: 10/26/14 BIRADS1:Neg Self Breast Exam: yes, every other month Colonoscopy:  never BMD:   Never  TDaP:  2016  Screening Labs:  Hb today: PCP, Urine today: PCP   reports that she has never smoked. She has never used smokeless tobacco. She reports that she drinks alcohol. She reports that she does not use illicit drugs.  Past Medical History  Diagnosis Date  . Headache(784.0)   . Arthritis   . Glaucoma   . Endometriosis   . Hyperlipidemia   . 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 Abnormal pap 1989    cryo  . Chicken pox   . Migraine   . Positive TB test   . Hypertension          Past Surgical History  Procedure Laterality Date  . Back surgery  99    HNP  . Cholecystectomy  88  . Diagnostic laparoscopy  88    endometrious  . Appendectomy  75  . Right oophorectomy  75  . Eye surgery  12    laser for glaucoma  . Breast biopsy      benign  . Lumbar fusion  08/2011    l 3-4  . Lumbar fusion      l 4-5    Current Outpatient Prescriptions  Medication Sig Dispense Refill  . aspirin 325 MG tablet Take 325 mg by mouth daily.    . Biotin 5000 MCG CAPS Take 5,000 mcg by mouth daily.      .  Brinzolamide-Brimonidine (SIMBRINZA) 1-0.2 % SUSP Apply 1 drop to eye 2 (two) times daily.    . Calcium Carbonate Antacid (TUMS PO) Take by mouth as needed.     . Carboxymethylcellul-Glycerin 0.5-0.9 % SOLN Apply 1 drop to eye as needed.    . Cholecalciferol (VITAMIN D-3) 5000 UNITS TABS Take by mouth 3 (three) times a week.    . cyclobenzaprine (FLEXERIL) 10 MG tablet Take 1 tablet (10 mg total) by mouth 3 (three) times daily as needed. For muscle spasms 30 tablet 2  . Eszopiclone 3 MG TABS Take 1 tablet by mouth as needed.    . ezetimibe (ZETIA) 10 MG tablet Take 1 tablet (10 mg total) by mouth daily. 30 tablet 2  . folic acid (FOLVITE) 124 MCG tablet Take 400 mcg by mouth daily.      Marland Kitchen ibuprofen (ADVIL,MOTRIN) 800 MG tablet Take 800 mg by mouth every 8 (eight) hours as needed. For pain     . Krill Oil 300 MG CAPS Take by mouth daily.    Marland Kitchen latanoprost (XALATAN) 0.005 % ophthalmic solution Apply  1 drop to eye daily.    . metoprolol succinate (TOPROL XL) 25 MG 24 hr tablet Take 1 tablet (25 mg total) by mouth daily. 30 tablet 11  . Multiple Vitamins-Minerals (MULTIVITAMINS THER. W/MINERALS) TABS Take 1 tablet by mouth daily.      . rosuvastatin (CRESTOR) 40 MG tablet Take 1 tablet (40 mg total) by mouth daily. 30 tablet 2  . spironolactone (ALDACTONE) 100 MG tablet Take 1 tablet (100 mg total) by mouth as needed. 30 tablet 1  . traMADol (ULTRAM) 50 MG tablet Take 1-2 tablets (50-100 mg total) by mouth every 6 (six) hours as needed. Maximum dose= 8 tablets per day, for pain 30 tablet 2   No current facility-administered medications for this visit.    Family History  Problem Relation Age of Onset  . Hypertension Father   . Glaucoma Father   . Prostate cancer Father   . Renal Disease Father   . Macular degeneration Father   . Breast cancer Maternal Grandmother   . Stroke Father   . Breast cancer Maternal Aunt     ROS:  Pertinent items are noted in HPI.  Otherwise, a comprehensive ROS was  negative.  Exam:   BP 110/60 mmHg  Pulse 68  Resp 16  Ht 5' 4.75" (1.645 m)  Wt 204 lb (92.534 kg)  BMI 34.20 kg/m2  LMP 09/03/2007    General appearance: alert, cooperative and appears stated age Head: Normocephalic, without obvious abnormality, atraumatic Neck: no adenopathy, supple, symmetrical, trachea midline and thyroid normal to inspection and palpation Lungs: clear to auscultation bilaterally Breasts: normal appearance, no masses or tenderness, Inspection negative, No nipple retraction or dimpling, No nipple discharge or bleeding, No axillary or supraclavicular adenopathy Heart: regular rate and rhythm Abdomen: vertical midline incision, soft, non-tender; bowel sounds normal; no masses,  no organomegaly Extremities: extremities normal, atraumatic, no cyanosis or edema Skin: Skin color, texture, turgor normal. No rashes or lesions Lymph nodes: Cervical, supraclavicular, and axillary nodes normal. No abnormal inguinal nodes palpated Neurologic: Grossly normal  Pelvic: External genitalia:  no lesions              Urethra:  normal appearing urethra with no masses, tenderness or lesions              Bartholins and Skenes: normal                 Vagina: normal appearing vagina with normal color and discharge, no lesions              Cervix: no lesions and cervix stenotic.              Pap taken: Yes.   Bimanual Exam:  Uterus:  normal size, contour, position, consistency, mobility, non-tender              Adnexa: normal adnexa and no mass, fullness, tenderness              Rectovaginal: Yes.  .  Confirms.              Anus:  normal sphincter tone, no lesions  Chaperone was present for exam.  Assessment:   Well woman visit with normal exam. Hx LGSIL.  New dx of HTN.  Plan: Yearly mammogram recommended after age 48.  Recommended self breast exam.  Pap and HR HPV as above. Discussed Calcium, Vitamin D, regular exercise program including cardiovascular and weight bearing  exercise. Labs performed.  No..   See orders. Refills  given on medications.  No..   I declined to refill spironolactone now that patient is being treated for HTN.  This needs to come from her PCP.     After visit summary provided.

## 2015-02-24 NOTE — Patient Instructions (Signed)

## 2015-03-01 LAB — IPS PAP TEST WITH HPV

## 2015-03-30 ENCOUNTER — Encounter: Payer: Self-pay | Admitting: Family Medicine

## 2015-03-30 ENCOUNTER — Ambulatory Visit (INDEPENDENT_AMBULATORY_CARE_PROVIDER_SITE_OTHER): Payer: PRIVATE HEALTH INSURANCE | Admitting: Family Medicine

## 2015-03-30 VITALS — BP 124/74 | HR 64 | Temp 98.4°F | Wt 207.8 lb

## 2015-03-30 DIAGNOSIS — E785 Hyperlipidemia, unspecified: Secondary | ICD-10-CM

## 2015-03-30 DIAGNOSIS — I10 Essential (primary) hypertension: Secondary | ICD-10-CM | POA: Diagnosis not present

## 2015-03-30 DIAGNOSIS — K219 Gastro-esophageal reflux disease without esophagitis: Secondary | ICD-10-CM | POA: Diagnosis not present

## 2015-03-30 DIAGNOSIS — R748 Abnormal levels of other serum enzymes: Secondary | ICD-10-CM

## 2015-03-30 DIAGNOSIS — Z0000 Encounter for general adult medical examination without abnormal findings: Secondary | ICD-10-CM | POA: Diagnosis not present

## 2015-03-30 LAB — BASIC METABOLIC PANEL
BUN: 10 mg/dL (ref 6–23)
CALCIUM: 9.5 mg/dL (ref 8.4–10.5)
CO2: 27 meq/L (ref 19–32)
CREATININE: 0.66 mg/dL (ref 0.40–1.20)
Chloride: 109 mEq/L (ref 96–112)
GFR: 120.09 mL/min (ref >60.00)
Glucose, Bld: 87 mg/dL (ref 70–99)
Potassium: 4.5 mEq/L (ref 3.5–5.1)
Sodium: 142 mEq/L (ref 135–145)

## 2015-03-30 LAB — HEPATIC FUNCTION PANEL
ALK PHOS: 72 U/L (ref 39–117)
ALT: 40 U/L — ABNORMAL HIGH (ref 0–35)
AST: 30 U/L (ref 0–37)
Albumin: 4.1 g/dL (ref 3.5–5.2)
BILIRUBIN DIRECT: 0.1 mg/dL (ref 0.0–0.3)
BILIRUBIN TOTAL: 0.6 mg/dL (ref 0.2–1.2)
TOTAL PROTEIN: 7.4 g/dL (ref 6.0–8.3)

## 2015-03-30 LAB — LIPID PANEL
Cholesterol: 187 mg/dL (ref 0–200)
HDL: 58.4 mg/dL (ref >39.00)
LDL Cholesterol: 115 mg/dL — ABNORMAL HIGH (ref 0–99)
NonHDL: 128.13
Total CHOL/HDL Ratio: 3
Triglycerides: 68 mg/dL (ref 0.0–149.0)
VLDL: 13.6 mg/dL (ref 0.0–40.0)

## 2015-03-30 MED ORDER — ESOMEPRAZOLE MAGNESIUM 40 MG PO CPDR: 40.0000 mg | DELAYED_RELEASE_CAPSULE | Freq: Every day | ORAL | Status: DC

## 2015-03-30 NOTE — Assessment & Plan Note (Signed)
Zantac not helping Discussed diet and HO given nexium for 6-8 weeks If no relief-- refer GI

## 2015-03-30 NOTE — Assessment & Plan Note (Signed)
con't crestor and zetia Check labs today

## 2015-03-30 NOTE — Progress Notes (Signed)
Patient ID: [name], female    DOB: May 11, 1961  Age: 54 y.o. MRN: [id number]    Subjective:  Subjective HPI [facility] presents for f/u cholesterol and bp.  No cp or sob or palp.  Pt is c/o reflux symptoms.  She is taking zantac with no relief.  It started while on vacation.    Review of Systems  Constitutional: Negative for diaphoresis, appetite change, fatigue and unexpected weight change.  Eyes: Negative for pain, redness and visual disturbance.  Respiratory: Negative for cough, chest tightness, shortness of breath and wheezing.   Cardiovascular: Negative for chest pain, palpitations and leg swelling.  Endocrine: Negative for cold intolerance, heat intolerance, polydipsia, polyphagia and polyuria.  Genitourinary: Negative for dysuria, frequency and difficulty urinating.  Neurological: Negative for dizziness, light-headedness, numbness and headaches.  Psychiatric/Behavioral: Negative for dysphoric mood. The patient is not nervous/anxious.     History Past Medical History  Diagnosis Date  . Headache(784.0)   . Arthritis   . Glaucoma   . Endometriosis   . Hyperlipidemia   . 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 Abnormal pap 1989    cryo  . Chicken pox   . Migraine   . Positive TB test   . Hypertension          She has past surgical history that includes Back surgery (99); Cholecystectomy (88); Diagnostic laparoscopy (88); Appendectomy (75); Right oophorectomy (75); Eye surgery (12); Breast biopsy; Lumbar fusion (08/2011); and Lumbar fusion.   Her family history includes Breast cancer in her maternal aunt and maternal grandmother; Glaucoma in her father; Hypertension in her father; Macular degeneration in her father; Prostate cancer in her father; Renal Disease in her father; Stroke in her father.She reports that she has never smoked. She has never used smokeless tobacco. She reports that she drinks alcohol. She reports that she does not use illicit  drugs.  Current Outpatient Prescriptions on File Prior to Visit  Medication Sig Dispense Refill  . aspirin 325 MG tablet Take 325 mg by mouth daily.    . Biotin 5000 MCG CAPS Take 5,000 mcg by mouth daily.      . Brinzolamide-Brimonidine (SIMBRINZA) 1-0.2 % SUSP Apply 1 drop to eye 2 (two) times daily.    . Calcium Carbonate Antacid (TUMS PO) Take by mouth as needed.     . Carboxymethylcellul-Glycerin 0.5-0.9 % SOLN Apply 1 drop to eye as needed.    . Cholecalciferol (VITAMIN D-3) 5000 UNITS TABS Take by mouth 3 (three) times a week.    . cyclobenzaprine (FLEXERIL) 10 MG tablet Take 1 tablet (10 mg total) by mouth 3 (three) times daily as needed. For muscle spasms 30 tablet 2  . Eszopiclone 3 MG TABS Take 1 tablet by mouth as needed.    . ezetimibe (ZETIA) 10 MG tablet Take 1 tablet (10 mg total) by mouth daily. 30 tablet 2  . folic acid (FOLVITE) 235 MCG tablet Take 400 mcg by mouth daily.      Marland Kitchen ibuprofen (ADVIL,MOTRIN) 800 MG tablet Take 800 mg by mouth every 8 (eight) hours as needed. For pain     . Krill Oil 300 MG CAPS Take by mouth daily.    Marland Kitchen latanoprost (XALATAN) 0.005 % ophthalmic solution Apply 1 drop to eye daily.    . metoprolol succinate (TOPROL XL) 25 MG 24 hr tablet Take 1 tablet (25 mg total) by mouth daily. 30 tablet 11  . Multiple Vitamins-Minerals (MULTIVITAMINS THER.  W/MINERALS) TABS Take 1 tablet by mouth daily.      . rosuvastatin (CRESTOR) 40 MG tablet Take 1 tablet (40 mg total) by mouth daily. 30 tablet 2  . spironolactone (ALDACTONE) 100 MG tablet Take 1 tablet (100 mg total) by mouth as needed. 30 tablet 1  . traMADol (ULTRAM) 50 MG tablet Take 1-2 tablets (50-100 mg total) by mouth every 6 (six) hours as needed. Maximum dose= 8 tablets per day, for pain 30 tablet 2   No current facility-administered medications on file prior to visit.     Objective:  Objective Physical Exam  Constitutional: She is oriented to person, place, and time. She appears  well-developed and well-nourished.  HENT:  Head: Normocephalic and atraumatic.  Eyes: Conjunctivae and EOM are normal.  Neck: Normal range of motion. Neck supple. No JVD present. Carotid bruit is not present. No thyromegaly present.  Cardiovascular: Normal rate, regular rhythm and normal heart sounds.   No murmur heard. Pulmonary/Chest: Effort normal and breath sounds normal. No respiratory distress. She has no wheezes. She has no rales. She exhibits no tenderness.  Abdominal: Soft. She exhibits no distension. There is no tenderness.  Musculoskeletal: She exhibits no edema.  Neurological: She is alert and oriented to person, place, and time.  Psychiatric: She has a normal mood and affect. Her behavior is normal.   BP 124/74 mmHg  Pulse 64  Temp(Src) 98.4 F (36.9 C) (Oral)  Wt 207 lb 12.8 oz (94.257 kg)  SpO2 98%  LMP 09/03/2007 Wt Readings from Last 3 Encounters:  03/30/15 207 lb 12.8 oz (94.257 kg)  02/24/15 204 lb (92.534 kg)  09/22/14 207 lb 6.4 oz (94.076 kg)     Lab Results  Component Value Date   WBC 6.9 09/22/2014   HGB 12.4 09/22/2014   HCT 38.0 09/22/2014   PLT 254 09/22/2014   GLUCOSE 87 09/22/2014   CHOL 205* 09/22/2014   TRIG 79 09/22/2014   HDL 69 09/22/2014   LDLCALC 120* 09/22/2014   ALT 69* 09/22/2014   AST 42* 09/22/2014   NA 140 09/22/2014   K 4.0 09/22/2014   CL 105 09/22/2014   CREATININE 0.62 09/22/2014   BUN 8 09/22/2014   CO2 25 09/22/2014   TSH 0.517 09/22/2014    Dg Foot Complete Left  12/26/2014   CLINICAL DATA:  Left foot pain/swelling at 5th metatarsal area, no known injury  EXAM: LEFT FOOT - COMPLETE 3+ VIEW  COMPARISON:  None.  FINDINGS: No fracture or dislocation is seen.  Accessory os navicularis.  The joint spaces are preserved.  Dorsal soft tissue swelling.  IMPRESSION: No fracture or dislocation is seen.  Accessory os navicularis.  Dorsal soft tissue swelling.   Electronically Signed   By: Julian Hy M.D.   On: 12/26/2014  15:41     Assessment & Plan:  Plan I am having Ms. Montano start on esomeprazole. I am also having her maintain her multivitamins ther. w/minerals, folic acid, Biotin, ibuprofen, Calcium Carbonate Antacid (TUMS PO), aspirin, Vitamin D-3, Brinzolamide-Brimonidine, Carboxymethylcellul-Glycerin, Eszopiclone, latanoprost, Krill Oil, metoprolol succinate, rosuvastatin, traMADol, ezetimibe, cyclobenzaprine, and spironolactone.  Meds ordered this encounter  Medications  . esomeprazole (NEXIUM) 40 MG capsule    Sig: Take 1 capsule (40 mg total) by mouth daily.    Dispense:  30 capsule    Refill:  3    Problem List Items Addressed This Visit    Hyperlipidemia - Primary    con't crestor and zetia Check labs today  Relevant Orders   Basic metabolic panel   Hepatic function panel   Lipid panel   GERD (gastroesophageal reflux disease)    Zantac not helping Discussed diet and HO given nexium for 6-8 weeks If no relief-- refer GI      Relevant Medications   esomeprazole (NEXIUM) 40 MG capsule   Elevated liver enzymes    Recheck enz today If elevated-- stop crestor 2 weeks and recheck and we will also get [place] abd       Other Visit Diagnoses    Essential hypertension        Relevant Orders    Basic metabolic panel    Hepatic function panel    Lipid panel    Preventative health care        Relevant Orders    Ambulatory referral to Gastroenterology       Follow-up: Return if symptoms worsen or fail to improve, for hyperlipidemia, hypertension.  Garnet Koyanagi, DO

## 2015-03-30 NOTE — Patient Instructions (Signed)

## 2015-03-30 NOTE — Progress Notes (Signed)
Pre visit review using our clinic review tool, if applicable. No additional management support is needed unless otherwise documented below in the visit note. 

## 2015-03-30 NOTE — Assessment & Plan Note (Signed)
Recheck enz today If elevated-- stop crestor 2 weeks and recheck and we will also get [place] abd

## 2015-05-09 ENCOUNTER — Other Ambulatory Visit: Payer: Self-pay | Admitting: Family Medicine

## 2015-07-18 ENCOUNTER — Ambulatory Visit: Payer: PRIVATE HEALTH INSURANCE | Admitting: Interventional Cardiology

## 2015-07-20 ENCOUNTER — Other Ambulatory Visit: Payer: Self-pay

## 2015-07-20 ENCOUNTER — Ambulatory Visit (INDEPENDENT_AMBULATORY_CARE_PROVIDER_SITE_OTHER): Payer: PRIVATE HEALTH INSURANCE | Admitting: Interventional Cardiology

## 2015-07-20 ENCOUNTER — Encounter: Payer: Self-pay | Admitting: Interventional Cardiology

## 2015-07-20 VITALS — BP 124/82 | HR 73 | Ht 65.5 in | Wt 211.8 lb

## 2015-07-20 DIAGNOSIS — I10 Essential (primary) hypertension: Secondary | ICD-10-CM

## 2015-07-20 DIAGNOSIS — I517 Cardiomegaly: Secondary | ICD-10-CM

## 2015-07-20 DIAGNOSIS — I350 Nonrheumatic aortic (valve) stenosis: Secondary | ICD-10-CM

## 2015-07-20 DIAGNOSIS — R030 Elevated blood-pressure reading, without diagnosis of hypertension: Secondary | ICD-10-CM | POA: Diagnosis not present

## 2015-07-20 DIAGNOSIS — IMO0001 Reserved for inherently not codable concepts without codable children: Secondary | ICD-10-CM

## 2015-07-20 MED ORDER — METOPROLOL SUCCINATE ER 25 MG PO TB24: 25.0000 mg | ORAL_TABLET | Freq: Every day | ORAL | Status: DC

## 2015-07-20 NOTE — Progress Notes (Signed)
Cardiology Office Note   Date:  07/20/2015   ID:  LANDRA SOLAK, DOB 12/15/1960, MRN [id number]  PCP:  Garnet Koyanagi, DO  Cardiologist:  Sinclair Grooms, MD   Chief Complaint  Patient presents with  . Hypertension      History of Present Illness: Yvonne Sanders is a 54 y.o. female who presents for aortic valve sclerosis/mild stenosis, hypertension, and LVH.  Since starting beta blocker therapy there've been no complaints. No side effects from the medication. Overall doing well.    Past Medical History  Diagnosis Date  . Headache(784.0)   . Arthritis   . Glaucoma   . Endometriosis   . Hyperlipidemia   . 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 Abnormal pap 1989    cryo  . Chicken pox   . Migraine   . Positive TB test   . Hypertension          Past Surgical History  Procedure Laterality Date  . Back surgery  99    HNP  . Cholecystectomy  88  . Diagnostic laparoscopy  88    endometrious  . Appendectomy  75  . Right oophorectomy  75  . Eye surgery  12    laser for glaucoma  . Breast biopsy      benign  . Lumbar fusion  08/2011    l 3-4  . Lumbar fusion      l 4-5     Current Outpatient Prescriptions  Medication Sig Dispense Refill  . aspirin 325 MG tablet Take 325 mg by mouth daily. Pt doesn't take when she takes Excedrin    . aspirin-acetaminophen-caffeine (EXCEDRIN MIGRAINE) 250-250-65 MG tablet Take 1 tablet by mouth every 6 (six) hours as needed for headache or migraine.    . Biotin 5000 MCG CAPS Take 5,000 mcg by mouth daily.      . Brinzolamide-Brimonidine (SIMBRINZA) 1-0.2 % SUSP Apply 1 drop to eye 2 (two) times daily.    . Calcium Carbonate Antacid (TUMS PO) Take 1 tablet by mouth daily as needed (heartburn).     . Carboxymethylcellul-Glycerin 0.5-0.9 % SOLN Apply 1 drop to eye daily as needed (eyes).     . Cholecalciferol (VITAMIN D-3) 5000 UNITS TABS Take 1 tablet by mouth 3 (three) times a week.     . cyclobenzaprine (FLEXERIL)  10 MG tablet Take 1 tablet (10 mg total) by mouth 3 (three) times daily as needed. For muscle spasms 30 tablet 2  . esomeprazole (NEXIUM) 40 MG capsule Take 1 capsule (40 mg total) by mouth daily. 30 capsule 3  . Eszopiclone 3 MG TABS Take 1 tablet by mouth as needed.    . folic acid (FOLVITE) [id number] MCG tablet Take 400 mcg by mouth daily.      Marland Kitchen ibuprofen (ADVIL,MOTRIN) 800 MG tablet Take 800 mg by mouth every 8 (eight) hours as needed. For pain     . Krill Oil 300 MG CAPS Take 1 capsule by mouth daily.     Marland Kitchen latanoprost (XALATAN) 0.005 % ophthalmic solution Apply 1 drop to eye daily.    . metoprolol succinate (TOPROL XL) 25 MG 24 hr tablet Take 1 tablet (25 mg total) by mouth daily. 30 tablet 11  . Multiple Vitamins-Minerals (MULTIVITAMINS THER. W/MINERALS) TABS Take 1 tablet by mouth daily.      . rosuvastatin (CRESTOR) 40 MG tablet take 1 tablet by mouth once daily 30 tablet 2  . spironolactone (  ALDACTONE) 100 MG tablet Take 1 tablet (100 mg total) by mouth as needed. 30 tablet 1  . traMADol (ULTRAM) 50 MG tablet Take 1-2 tablets (50-100 mg total) by mouth every 6 (six) hours as needed. Maximum dose= 8 tablets per day, for pain 30 tablet 2  . ZETIA 10 MG tablet take 1 tablet by mouth once daily 30 tablet 2   No current facility-administered medications for this visit.    Allergies:   Nubain; Corticosteroids; Dilaudid; Hydrocodone; Nubain; Oxycodone-acetaminophen; Percodan; and Tomato    Social History:  The patient  reports that she has never smoked. She has never used smokeless tobacco. She reports that she drinks alcohol. She reports that she does not use illicit drugs.   Family History:  The patient's family history includes Breast cancer in her maternal aunt and maternal grandmother; Glaucoma in her father; Hypertension in her father; Macular degeneration in her father; Prostate cancer in her father; Renal Disease in her father; Stroke in her father.    ROS:  Please see the history of  present illness.   Otherwise, review of systems are positive for fatigue and stress related to family issues. Not sleeping well at night..   All other systems are reviewed and negative.    PHYSICAL EXAM: VS:  BP 124/82 mmHg  Pulse 73  Ht 5' 5.5" (1.664 m)  Wt 96.072 kg (211 lb 12.8 oz)  BMI 34.70 kg/m2  LMP 09/03/2007 , BMI Body mass index is 34.7 kg/(m^2). GEN: Well nourished, well developed, in no acute distress HEENT: normal Neck: no JVD, carotid bruits, or masses Cardiac: RRR.  There is 2/6 systolic murmur. There is no rub, or gallop. There is no edema. Respiratory:  clear to auscultation bilaterally, normal work of breathing. GI: soft, nontender, nondistended, + BS MS: no deformity or atrophy Skin: warm and dry, no rash Neuro:  Strength and sensation are intact Psych: euthymic mood, full affect   EKG:  EKG is ordered today. The ekg reveals sinus rhythm with normal appearance   Recent Labs: 09/22/2014: Hemoglobin 12.4; Platelets 254; TSH 0.517 03/30/2015: ALT 40*; BUN 10; Creatinine, Ser 0.66; Potassium 4.5; Sodium 142    Lipid Panel    Component Value Date/Time   CHOL 187 03/30/2015 0919   TRIG 68.0 03/30/2015 0919   HDL 58.40 03/30/2015 0919   CHOLHDL 3 03/30/2015 0919   VLDL 13.6 03/30/2015 0919   LDLCALC 115* 03/30/2015 0919      Wt Readings from Last 3 Encounters:  07/20/15 96.072 kg (211 lb 12.8 oz)  03/30/15 94.257 kg (207 lb 12.8 oz)  02/24/15 92.534 kg (204 lb)      Other studies Reviewed: Additional studies/ records that were reviewed today include: Echocardiogram from 1 year ago and other electronic medical record data.. The findings include mild LVH and aortic valve sclerosis/mild stenosis..    ASSESSMENT AND PLAN:  1. Essential hypertension Normal  2. LVH (left ventricular hypertrophy) Mild basal septal hypertrophy  3. Elevated blood pressure Normal  4. Aortic stenosis, mild No change in auscultation. Will need follow-up echocardiogram  in 2-3 years.    Current medicines are reviewed at length with the patient today.  The patient has the following concerns regarding medicines: None.  The following changes/actions have been instituted:    Follow-up echocardiogram in 2-3 years to [place] assess aortic valve  Labs/ tests ordered today include:  No orders of the defined types were placed in this encounter.     Disposition:   FU  with HS in 1 year  Signed, Sinclair Grooms, MD  07/20/2015 8:32 AM    [address], [place], [place]  [address] Phone: [phone]; Fax: [phone]

## 2015-07-20 NOTE — Patient Instructions (Signed)

## 2015-09-07 ENCOUNTER — Other Ambulatory Visit: Payer: Self-pay | Admitting: Family Medicine

## 2015-10-11 ENCOUNTER — Telehealth: Payer: Self-pay | Admitting: *Deleted

## 2015-10-11 NOTE — Telephone Encounter (Signed)
Pt returned call. Pt is at work and is unable to participate in pre-visit call at this time. Appt confirmed w/ pt.

## 2015-10-11 NOTE — Telephone Encounter (Signed)
Pt not available at time of call. Spoke w/ pt's brother and asked him to have pt return call today if she is available.

## 2015-10-12 ENCOUNTER — Encounter: Payer: Self-pay | Admitting: Family Medicine

## 2015-10-12 ENCOUNTER — Ambulatory Visit (INDEPENDENT_AMBULATORY_CARE_PROVIDER_SITE_OTHER): Payer: PRIVATE HEALTH INSURANCE | Admitting: Family Medicine

## 2015-10-12 VITALS — BP 116/82 | HR 67 | Temp 98.2°F | Ht 66.0 in | Wt 209.8 lb

## 2015-10-12 DIAGNOSIS — R82998 Other abnormal findings in urine: Secondary | ICD-10-CM

## 2015-10-12 DIAGNOSIS — E785 Hyperlipidemia, unspecified: Secondary | ICD-10-CM

## 2015-10-12 DIAGNOSIS — R8299 Other abnormal findings in urine: Secondary | ICD-10-CM

## 2015-10-12 DIAGNOSIS — I10 Essential (primary) hypertension: Secondary | ICD-10-CM

## 2015-10-12 DIAGNOSIS — Z0000 Encounter for general adult medical examination without abnormal findings: Secondary | ICD-10-CM | POA: Diagnosis not present

## 2015-10-12 DIAGNOSIS — M5440 Lumbago with sciatica, unspecified side: Secondary | ICD-10-CM

## 2015-10-12 DIAGNOSIS — Z1159 Encounter for screening for other viral diseases: Secondary | ICD-10-CM

## 2015-10-12 LAB — COMPREHENSIVE METABOLIC PANEL
ALK PHOS: 68 U/L (ref 39–117)
ALT: 49 U/L — ABNORMAL HIGH (ref 0–35)
AST: 36 U/L (ref 0–37)
Albumin: 4.2 g/dL (ref 3.5–5.2)
BUN: 12 mg/dL (ref 6–23)
CO2: 30 mEq/L (ref 19–32)
CREATININE: 0.65 mg/dL (ref 0.40–1.20)
Calcium: 9.9 mg/dL (ref 8.4–10.5)
Chloride: 108 mEq/L (ref 96–112)
GFR: 121.98 mL/min (ref >60.00)
GLUCOSE: 92 mg/dL (ref 70–99)
POTASSIUM: 3.9 meq/L (ref 3.5–5.1)
Sodium: 143 mEq/L (ref 135–145)
Total Bilirubin: 0.5 mg/dL (ref 0.2–1.2)
Total Protein: 7.6 g/dL (ref 6.0–8.3)

## 2015-10-12 LAB — LIPID PANEL
CHOLESTEROL: 181 mg/dL (ref 0–200)
HDL: 61.9 mg/dL (ref >39.00)
LDL Cholesterol: 104 mg/dL — ABNORMAL HIGH (ref 0–99)
NonHDL: 119.44
TRIGLYCERIDES: 75 mg/dL (ref 0.0–149.0)
Total CHOL/HDL Ratio: 3
VLDL: 15 mg/dL (ref 0.0–40.0)

## 2015-10-12 LAB — POCT URINALYSIS DIPSTICK
Bilirubin, UA: NEGATIVE
Blood, UA: NEGATIVE
Glucose, UA: NEGATIVE
Ketones, UA: NEGATIVE
LEUKOCYTES UA: NEGATIVE
NITRITE UA: NEGATIVE
PROTEIN UA: NEGATIVE
SPEC GRAV UA: 1.015
UROBILINOGEN UA: 0.2
pH, UA: 8.5

## 2015-10-12 LAB — CBC WITH DIFFERENTIAL/PLATELET
Basophils Absolute: 0 10*3/uL (ref 0.0–0.1)
Basophils Relative: 0.6 % (ref 0.0–3.0)
EOS ABS: 0 10*3/uL (ref 0.0–0.7)
EOS PCT: 0.8 % (ref 0.0–5.0)
HCT: 38.3 % (ref 36.0–46.0)
Hemoglobin: 11.9 g/dL — ABNORMAL LOW (ref 12.0–15.0)
LYMPHS ABS: 2.2 10*3/uL (ref 0.7–4.0)
Lymphocytes Relative: 37.5 % (ref 12.0–46.0)
MCHC: 31.1 g/dL (ref 30.0–36.0)
MCV: 84.1 fl (ref 78.0–100.0)
MONO ABS: 0.4 10*3/uL (ref 0.1–1.0)
Monocytes Relative: 6.8 % (ref 3.0–12.0)
NEUTROS PCT: 54.3 % (ref 43.0–77.0)
Neutro Abs: 3.2 10*3/uL (ref 1.4–7.7)
Platelets: 201 10*3/uL (ref 150.0–400.0)
RBC: 4.55 Mil/uL (ref 3.87–5.11)
RDW: 12.9 % (ref 11.5–15.5)
WBC: 6 10*3/uL (ref 4.0–10.5)

## 2015-10-12 LAB — HEPATITIS C ANTIBODY: HCV AB: NEGATIVE

## 2015-10-12 LAB — TSH: TSH: 0.53 u[IU]/mL (ref 0.35–4.50)

## 2015-10-12 MED ORDER — ROSUVASTATIN CALCIUM 40 MG PO TABS: 40.0000 mg | ORAL_TABLET | Freq: Every day | ORAL | Status: DC

## 2015-10-12 MED ORDER — HYDROCODONE-ACETAMINOPHEN 5-325 MG PO TABS: 1.0000 | ORAL_TABLET | Freq: Four times a day (QID) | ORAL | Status: DC | PRN

## 2015-10-12 MED ORDER — EZETIMIBE 10 MG PO TABS: 10.0000 mg | ORAL_TABLET | Freq: Every day | ORAL | Status: DC

## 2015-10-12 NOTE — Progress Notes (Signed)
Pre visit review using our clinic review tool, if applicable. No additional management support is needed unless otherwise documented below in the visit note. 

## 2015-10-12 NOTE — Progress Notes (Signed)
Subjective:     Yvonne Sanders is a 55 y.o. female and is here for a comprehensive physical exam. The patient reports no problems.  Social History   Social History  . Marital Status: Married    Spouse Name: N/A  . Number of Children: N/A  . Years of Education: N/A   Occupational History  . Not on file.   Social History Main Topics  . Smoking status: Never Smoker   . Smokeless tobacco: Never Used  . Alcohol Use: Yes     Comment: occ. wine  . Drug Use: No  . Sexual Activity:    Partners: Male    Birth Control/ Protection: Post-menopausal   Other Topics Concern  . Not on file   Social History Narrative   Health Maintenance  Topic Date Due  . Hepatitis C Screening  08-Mar-1961  . COLONOSCOPY  05/21/2011  . HIV Screening  03/29/2016 (Originally 05/20/1976)  . INFLUENZA VACCINE  10/11/2016 (Originally 04/03/2015)  . MAMMOGRAM  10/26/2016  . PAP SMEAR  02/23/2018  . TETANUS/TDAP  09/22/2024    The following portions of the patient's history were reviewed and updated as appropriate:  She  has a past medical history of Headache(784.0); Arthritis; Glaucoma; Endometriosis; Hyperlipidemia; Fibroid (1994); Abnormal pap (1989); Chicken pox; Migraine; Positive TB test; and Hypertension. She  does not have any pertinent problems on file. She  has past surgical history that includes Back surgery (99); Cholecystectomy (88); Diagnostic laparoscopy (88); Appendectomy (75); Right oophorectomy (75); Eye surgery (12); Breast biopsy; Lumbar fusion (08/2011); and Lumbar fusion. Her family history includes Breast cancer in her maternal aunt and maternal grandmother; Glaucoma in her father; Heart disease in her father; Hypertension in her father; Kidney disease in her father; Macular degeneration in her father; Prostate cancer in her father; Renal Disease in her father; Stroke in her father. She  reports that she has never smoked. She has never used smokeless tobacco. She reports that she drinks  alcohol. She reports that she does not use illicit drugs. She has a current medication list which includes the following prescription(s): aspirin, aspirin-acetaminophen-caffeine, biotin, brinzolamide-brimonidine, calcium carbonate antacid, carboxymethylcellul-glycerin, vitamin d-3, cyclobenzaprine, cyclopentolate, esomeprazole, eszopiclone, ezetimibe, folic acid, ibuprofen, krill oil, latanoprost, metoprolol succinate, multivitamins ther. w/minerals, rosuvastatin, spironolactone, and hydrocodone-acetaminophen. Current Outpatient Prescriptions on File Prior to Visit  Medication Sig Dispense Refill  . aspirin 325 MG tablet Take 325 mg by mouth daily. Pt doesn't take when she takes Excedrin    . aspirin-acetaminophen-caffeine (EXCEDRIN MIGRAINE) 250-250-65 MG tablet Take 1 tablet by mouth every 6 (six) hours as needed for headache or migraine.    . Biotin 5000 MCG CAPS Take 5,000 mcg by mouth daily.      . Brinzolamide-Brimonidine (SIMBRINZA) 1-0.2 % SUSP Apply 1 drop to eye 2 (two) times daily.    . Calcium Carbonate Antacid (TUMS PO) Take 1 tablet by mouth daily as needed (heartburn).     . Carboxymethylcellul-Glycerin 0.5-0.9 % SOLN Apply 1 drop to eye daily as needed (eyes).     . Cholecalciferol (VITAMIN D-3) 5000 UNITS TABS Take 1 tablet by mouth 3 (three) times a week.     . cyclobenzaprine (FLEXERIL) 10 MG tablet take 1 tablet by mouth three times a day if needed for muscle spasm 30 tablet 2  . esomeprazole (NEXIUM) 40 MG capsule Take 1 capsule (40 mg total) by mouth daily. 30 capsule 3  . Eszopiclone 3 MG TABS Take 1 tablet by mouth as needed.    Marland Kitchen  folic acid (FOLVITE) 144 MCG tablet Take 400 mcg by mouth daily.      Marland Kitchen ibuprofen (ADVIL,MOTRIN) 800 MG tablet Take 800 mg by mouth every 8 (eight) hours as needed. For pain     . Krill Oil 300 MG CAPS Take 1 capsule by mouth daily.     Marland Kitchen latanoprost (XALATAN) 0.005 % ophthalmic solution Apply 1 drop to eye daily.    . metoprolol succinate (TOPROL  XL) 25 MG 24 hr tablet Take 1 tablet (25 mg total) by mouth daily. 30 tablet 11  . Multiple Vitamins-Minerals (MULTIVITAMINS THER. W/MINERALS) TABS Take 1 tablet by mouth daily.      Marland Kitchen spironolactone (ALDACTONE) 100 MG tablet Take 1 tablet (100 mg total) by mouth as needed. 30 tablet 1   No current facility-administered medications on file prior to visit.   She is allergic to nubain; corticosteroids; dilaudid; nubain; oxycodone-acetaminophen; percodan; and tomato..  Review of Systems Review of Systems  Constitutional: Negative for activity change, appetite change and fatigue.  HENT: Negative for hearing loss, congestion, tinnitus and ear discharge.  dentist q29mEyes: Negative for visual disturbance (see optho q1y -- vision corrected to 20/20 with glasses).  Respiratory: Negative for cough, chest tightness and shortness of breath.   Cardiovascular: Negative for chest pain, palpitations and leg swelling.  Gastrointestinal: Negative for abdominal pain, diarrhea, constipation and abdominal distention.  Genitourinary: Negative for urgency, frequency, decreased urine volume and difficulty urinating.  Musculoskeletal: Negative for back pain, arthralgias and gait problem.  Skin: Negative for color change, pallor and rash.  Neurological: Negative for dizziness, light-headedness, numbness and headaches.  Hematological: Negative for adenopathy. Does not bruise/bleed easily.  Psychiatric/Behavioral: Negative for suicidal ideas, confusion, sleep disturbance, self-injury, dysphoric mood, decreased concentration and agitation.       Objective:    BP 116/82 mmHg  Pulse 67  Temp(Src) 98.2 F (36.8 C) (Oral)  Ht _0  (1.676 m)  Wt 209 lb 12.8 oz (95.165 kg)  BMI 33.88 kg/m2  SpO2 98%  LMP 09/03/2007 General appearance: alert, cooperative, appears stated age and no distress Head: Normocephalic, without obvious abnormality, atraumatic Eyes: negative findings: lids and lashes normal, conjunctivae  and sclerae normal and pupils equal, round, reactive to light and accomodation Ears: normal TM's and external ear canals both ears Nose: Nares normal. Septum midline. Mucosa normal. No drainage or sinus tenderness. Throat: lips, mucosa, and tongue normal; teeth and gums normal Neck: no adenopathy, no carotid bruit, no JVD, supple, symmetrical, trachea midline and thyroid not enlarged, symmetric, no tenderness/mass/nodules Back: symmetric, no curvature. ROM normal. No CVA tenderness. Lungs: clear to auscultation bilaterally Breasts: gyn Heart: S1, S2 normal --murmur Abdomen: soft, non-tender; bowel sounds normal; no masses,  no organomegaly Pelvic: deferred--gyn Extremities: extremities normal, atraumatic, no cyanosis or edema Pulses: 2+ and symmetric Skin: Skin color, texture, turgor normal. No rashes or lesions Lymph nodes: Cervical, supraclavicular, and axillary nodes normal. Neurologic: Alert and oriented X 3, normal strength and tone. Normal symmetric reflexes. Normal coordination and gait Psych- no depression, no anxiety      Assessment:    Healthy female exam.     Plan:    ghm utd Check labs See After Visit Summary for Counseling Recommendations    1. Preventative health care  See above - Comp Met (CMET) - CBC with Differential/Platelet - Lipid panel - POCT urinalysis dipstick - TSH  2. Hyperlipidemia Check labs - ezetimibe (ZETIA) 10 MG tablet; Take 1 tablet (10 mg total) by mouth daily.  Dispense: 30 tablet; Refill: 2 - rosuvastatin (CRESTOR) 40 MG tablet; Take 1 tablet (40 mg total) by mouth daily.  Dispense: 30 tablet; Refill: 2 - Comp Met (CMET) - Lipid panel  3. Essential hypertension Metoprolol  - Comp Met (CMET) - CBC with Differential/Platelet - Lipid panel - POCT urinalysis dipstick - TSH  4. Low back pain with sciatica, sciatica laterality unspecified, unspecified back pain laterality   - HYDROcodone-acetaminophen (NORCO/VICODIN) 5-325 MG  tablet; Take 1 tablet by mouth every 6 (six) hours as needed for moderate pain.  Dispense: 30 tablet; Refill: 0

## 2015-10-12 NOTE — Patient Instructions (Signed)
Preventive Care for Adults, Female A healthy lifestyle and preventive care can promote health and wellness. Preventive health guidelines for women include the following key practices.  A routine yearly physical is a good way to check with your health care provider about your health and preventive screening. It is a chance to share any concerns and updates on your health and to receive a thorough exam.  Visit your dentist for a routine exam and preventive care every 6 months. Brush your teeth twice a day and floss once a day. Good oral hygiene prevents tooth decay and gum disease.  The frequency of eye exams is based on your age, health, family medical history, use of contact lenses, and other factors. Follow your health care provider's recommendations for frequency of eye exams.  Eat a healthy diet. Foods like vegetables, fruits, whole grains, low-fat dairy products, and lean protein foods contain the nutrients you need without too many calories. Decrease your intake of foods high in solid fats, added sugars, and salt. Eat the right amount of calories for you.Get information about a proper diet from your health care provider, if necessary.  Regular physical exercise is one of the most important things you can do for your health. Most adults should get at least 150 minutes of moderate-intensity exercise (any activity that increases your heart rate and causes you to sweat) each week. In addition, most adults need muscle-strengthening exercises on 2 or more days a week.  Maintain a healthy weight. The body mass index (BMI) is a screening tool to identify possible weight problems. It provides an estimate of body fat based on height and weight. Your health care provider can find your BMI and can help you achieve or maintain a healthy weight.For adults 20 years and older:  A BMI below 18.5 is considered underweight.  A BMI of 18.5 to 24.9 is normal.  A BMI of 25 to 29.9 is considered overweight.  A  BMI of 30 and above is considered obese.  Maintain normal blood lipids and cholesterol levels by exercising and minimizing your intake of saturated fat. Eat a balanced diet with plenty of fruit and vegetables. Blood tests for lipids and cholesterol should begin at age 45 and be repeated every 5 years. If your lipid or cholesterol levels are high, you are over 50, or you are at high risk for heart disease, you may need your cholesterol levels checked more frequently.Ongoing high lipid and cholesterol levels should be treated with medicines if diet and exercise are not working.  If you smoke, find out from your health care provider how to quit. If you do not use tobacco, do not start.  Lung cancer screening is recommended for adults aged 45-80 years who are at high risk for developing lung cancer because of a history of smoking. A yearly low-dose CT scan of the lungs is recommended for people who have at least a 30-pack-year history of smoking and are a current smoker or have quit within the past 15 years. A pack year of smoking is smoking an average of 1 pack of cigarettes a day for 1 year (for example: 1 pack a day for 30 years or 2 packs a day for 15 years). Yearly screening should continue until the smoker has stopped smoking for at least 15 years. Yearly screening should be stopped for people who develop a health problem that would prevent them from having lung cancer treatment.  If you are pregnant, do not drink alcohol. If you are  breastfeeding, be very cautious about drinking alcohol. If you are not pregnant and choose to drink alcohol, do not have more than 1 drink per day. One drink is considered to be 12 ounces (355 mL) of beer, 5 ounces (148 mL) of wine, or 1.5 ounces (44 mL) of liquor.  Avoid use of street drugs. Do not share needles with anyone. Ask for help if you need support or instructions about stopping the use of drugs.  High blood pressure causes heart disease and increases the risk  of stroke. Your blood pressure should be checked at least every 1 to 2 years. Ongoing high blood pressure should be treated with medicines if weight loss and exercise do not work.  If you are 55-79 years old, ask your health care provider if you should take aspirin to prevent strokes.  Diabetes screening is done by taking a blood sample to check your blood glucose level after you have not eaten for a certain period of time (fasting). If you are not overweight and you do not have risk factors for diabetes, you should be screened once every 3 years starting at age 45. If you are overweight or obese and you are 40-70 years of age, you should be screened for diabetes every year as part of your cardiovascular risk assessment.  Breast cancer screening is essential preventive care for women. You should practice "breast self-awareness." This means understanding the normal appearance and feel of your breasts and may include breast self-examination. Any changes detected, no matter how small, should be reported to a health care provider. Women in their 20s and 30s should have a clinical breast exam (CBE) by a health care provider as part of a regular health exam every 1 to 3 years. After age 40, women should have a CBE every year. Starting at age 40, women should consider having a mammogram (breast X-ray test) every year. Women who have a family history of breast cancer should talk to their health care provider about genetic screening. Women at a high risk of breast cancer should talk to their health care providers about having an MRI and a mammogram every year.  Breast cancer gene (BRCA)-related cancer risk assessment is recommended for women who have family members with BRCA-related cancers. BRCA-related cancers include breast, ovarian, tubal, and peritoneal cancers. Having family members with these cancers may be associated with an increased risk for harmful changes (mutations) in the breast cancer genes BRCA1 and  BRCA2. Results of the assessment will determine the need for genetic counseling and BRCA1 and BRCA2 testing.  Your health care provider may recommend that you be screened regularly for cancer of the pelvic organs (ovaries, uterus, and vagina). This screening involves a pelvic examination, including checking for microscopic changes to the surface of your cervix (Pap test). You may be encouraged to have this screening done every 3 years, beginning at age 21.  For women ages 30-65, health care providers may recommend pelvic exams and Pap testing every 3 years, or they may recommend the Pap and pelvic exam, combined with testing for human papilloma virus (HPV), every 5 years. Some types of HPV increase your risk of cervical cancer. Testing for HPV may also be done on women of any age with unclear Pap test results.  Other health care providers may not recommend any screening for nonpregnant women who are considered low risk for pelvic cancer and who do not have symptoms. Ask your health care provider if a screening pelvic exam is right for   you.  If you have had past treatment for cervical cancer or a condition that could lead to cancer, you need Pap tests and screening for cancer for at least 20 years after your treatment. If Pap tests have been discontinued, your risk factors (such as having a new sexual partner) need to be reassessed to determine if screening should resume. Some women have medical problems that increase the chance of getting cervical cancer. In these cases, your health care provider may recommend more frequent screening and Pap tests.  Colorectal cancer can be detected and often prevented. Most routine colorectal cancer screening begins at the age of 50 years and continues through age 75 years. However, your health care provider may recommend screening at an earlier age if you have risk factors for colon cancer. On a yearly basis, your health care provider may provide home test kits to check  for hidden blood in the stool. Use of a small camera at the end of a tube, to directly examine the colon (sigmoidoscopy or colonoscopy), can detect the earliest forms of colorectal cancer. Talk to your health care provider about this at age 50, when routine screening begins. Direct exam of the colon should be repeated every 5-10 years through age 75 years, unless early forms of precancerous polyps or small growths are found.  People who are at an increased risk for hepatitis B should be screened for this virus. You are considered at high risk for hepatitis B if:  You were born in a country where hepatitis B occurs often. Talk with your health care provider about which countries are considered high risk.  Your parents were born in a high-risk country and you have not received a shot to protect against hepatitis B (hepatitis B vaccine).  You have HIV or AIDS.  You use needles to inject street drugs.  You live with, or have sex with, someone who has hepatitis B.  You get hemodialysis treatment.  You take certain medicines for conditions like cancer, organ transplantation, and autoimmune conditions.  Hepatitis C blood testing is recommended for all people born from 1945 through 1965 and any individual with known risks for hepatitis C.  Practice safe sex. Use condoms and avoid high-risk sexual practices to reduce the spread of sexually transmitted infections (STIs). STIs include gonorrhea, chlamydia, syphilis, trichomonas, herpes, HPV, and human immunodeficiency virus (HIV). Herpes, HIV, and HPV are viral illnesses that have no cure. They can result in disability, cancer, and death.  You should be screened for sexually transmitted illnesses (STIs) including gonorrhea and chlamydia if:  You are sexually active and are younger than 24 years.  You are older than 24 years and your health care provider tells you that you are at risk for this type of infection.  Your sexual activity has changed  since you were last screened and you are at an increased risk for chlamydia or gonorrhea. Ask your health care provider if you are at risk.  If you are at risk of being infected with HIV, it is recommended that you take a prescription medicine daily to prevent HIV infection. This is called preexposure prophylaxis (PrEP). You are considered at risk if:  You are sexually active and do not regularly use condoms or know the HIV status of your partner(s).  You take drugs by injection.  You are sexually active with a partner who has HIV.  Talk with your health care provider about whether you are at high risk of being infected with HIV. If   you choose to begin PrEP, you should first be tested for HIV. You should then be tested every 3 months for as long as you are taking PrEP.  Osteoporosis is a disease in which the bones lose minerals and strength with aging. This can result in serious bone fractures or breaks. The risk of osteoporosis can be identified using a bone density scan. Women ages 67 years and over and women at risk for fractures or osteoporosis should discuss screening with their health care providers. Ask your health care provider whether you should take a calcium supplement or vitamin D to reduce the rate of osteoporosis.  Menopause can be associated with physical symptoms and risks. Hormone replacement therapy is available to decrease symptoms and risks. You should talk to your health care provider about whether hormone replacement therapy is right for you.  Use sunscreen. Apply sunscreen liberally and repeatedly throughout the day. You should seek shade when your shadow is shorter than you. Protect yourself by wearing long sleeves, pants, a wide-brimmed hat, and sunglasses year round, whenever you are outdoors.  Once a month, do a whole body skin exam, using a mirror to look at the skin on your back. Tell your health care provider of new moles, moles that have irregular borders, moles that  are larger than a pencil eraser, or moles that have changed in shape or color.  Stay current with required vaccines (immunizations).  Influenza vaccine. All adults should be immunized every year.  Tetanus, diphtheria, and acellular pertussis (Td, Tdap) vaccine. Pregnant women should receive 1 dose of Tdap vaccine during each pregnancy. The dose should be obtained regardless of the length of time since the last dose. Immunization is preferred during the 27th-36th week of gestation. An adult who has not previously received Tdap or who does not know her vaccine status should receive 1 dose of Tdap. This initial dose should be followed by tetanus and diphtheria toxoids (Td) booster doses every 10 years. Adults with an unknown or incomplete history of completing a 3-dose immunization series with Td-containing vaccines should begin or complete a primary immunization series including a Tdap dose. Adults should receive a Td booster every 10 years.  Varicella vaccine. An adult without evidence of immunity to varicella should receive 2 doses or a second dose if she has previously received 1 dose. Pregnant females who do not have evidence of immunity should receive the first dose after pregnancy. This first dose should be obtained before leaving the health care facility. The second dose should be obtained 4-8 weeks after the first dose.  Human papillomavirus (HPV) vaccine. Females aged 13-26 years who have not received the vaccine previously should obtain the 3-dose series. The vaccine is not recommended for use in pregnant females. However, pregnancy testing is not needed before receiving a dose. If a female is found to be pregnant after receiving a dose, no treatment is needed. In that case, the remaining doses should be delayed until after the pregnancy. Immunization is recommended for any person with an immunocompromised condition through the age of 61 years if she did not get any or all doses earlier. During the  3-dose series, the second dose should be obtained 4-8 weeks after the first dose. The third dose should be obtained 24 weeks after the first dose and 16 weeks after the second dose.  Zoster vaccine. One dose is recommended for adults aged 30 years or older unless certain conditions are present.  Measles, mumps, and rubella (MMR) vaccine. Adults born  before 1957 generally are considered immune to measles and mumps. Adults born in 1957 or later should have 1 or more doses of MMR vaccine unless there is a contraindication to the vaccine or there is laboratory evidence of immunity to each of the three diseases. A routine second dose of MMR vaccine should be obtained at least 28 days after the first dose for students attending postsecondary schools, health care workers, or international travelers. People who received inactivated measles vaccine or an unknown type of measles vaccine during 1963-1967 should receive 2 doses of MMR vaccine. People who received inactivated mumps vaccine or an unknown type of mumps vaccine before 1979 and are at high risk for mumps infection should consider immunization with 2 doses of MMR vaccine. For females of childbearing age, rubella immunity should be determined. If there is no evidence of immunity, females who are not pregnant should be vaccinated. If there is no evidence of immunity, females who are pregnant should delay immunization until after pregnancy. Unvaccinated health care workers born before 1957 who lack laboratory evidence of measles, mumps, or rubella immunity or laboratory confirmation of disease should consider measles and mumps immunization with 2 doses of MMR vaccine or rubella immunization with 1 dose of MMR vaccine.  Pneumococcal 13-valent conjugate (PCV13) vaccine. When indicated, a person who is uncertain of his immunization history and has no record of immunization should receive the PCV13 vaccine. All adults 65 years of age and older should receive this  vaccine. An adult aged 19 years or older who has certain medical conditions and has not been previously immunized should receive 1 dose of PCV13 vaccine. This PCV13 should be followed with a dose of pneumococcal polysaccharide (PPSV23) vaccine. Adults who are at high risk for pneumococcal disease should obtain the PPSV23 vaccine at least 8 weeks after the dose of PCV13 vaccine. Adults older than 55 years of age who have normal immune system function should obtain the PPSV23 vaccine dose at least 1 year after the dose of PCV13 vaccine.  Pneumococcal polysaccharide (PPSV23) vaccine. When PCV13 is also indicated, PCV13 should be obtained first. All adults aged 65 years and older should be immunized. An adult younger than age 65 years who has certain medical conditions should be immunized. Any person who resides in a nursing home or long-term care facility should be immunized. An adult smoker should be immunized. People with an immunocompromised condition and certain other conditions should receive both PCV13 and PPSV23 vaccines. People with human immunodeficiency virus (HIV) infection should be immunized as soon as possible after diagnosis. Immunization during chemotherapy or radiation therapy should be avoided. Routine use of PPSV23 vaccine is not recommended for American Indians, [place] Natives, or people younger than 65 years unless there are medical conditions that require PPSV23 vaccine. When indicated, people who have unknown immunization and have no record of immunization should receive PPSV23 vaccine. One-time revaccination 5 years after the first dose of PPSV23 is recommended for people aged 19-64 years who have chronic kidney failure, nephrotic syndrome, asplenia, or immunocompromised conditions. People who received 1-2 doses of PPSV23 before age 65 years should receive another dose of PPSV23 vaccine at age 65 years or later if at least 5 years have passed since the previous dose. Doses of PPSV23 are not  needed for people immunized with PPSV23 at or after age 65 years.  Meningococcal vaccine. Adults with asplenia or persistent complement component deficiencies should receive 2 doses of quadrivalent meningococcal conjugate (MenACWY-D) vaccine. The doses should be obtained   at least 2 months apart. Microbiologists working with certain meningococcal bacteria, [place] recruits, people at risk during an outbreak, and people who travel to or live in countries with a high rate of meningitis should be immunized. A first-year college student up through age 34 years who is living in a residence hall should receive a dose if she did not receive a dose on or after her 16th birthday. Adults who have certain high-risk conditions should receive one or more doses of vaccine.  Hepatitis A vaccine. Adults who wish to be protected from this disease, have certain high-risk conditions, work with hepatitis A-infected animals, work in hepatitis A research labs, or travel to or work in countries with a high rate of hepatitis A should be immunized. Adults who were previously unvaccinated and who anticipate close contact with an international adoptee during the first 60 days after arrival in the [place] from a country with a high rate of hepatitis A should be immunized.  Hepatitis B vaccine. Adults who wish to be protected from this disease, have certain high-risk conditions, may be exposed to blood or other infectious body fluids, are household contacts or sex partners of hepatitis B positive people, are clients or workers in certain care facilities, or travel to or work in countries with a high rate of hepatitis B should be immunized.  Haemophilus influenzae type b (Hib) vaccine. A previously unvaccinated person with asplenia or sickle cell disease or having a scheduled splenectomy should receive 1 dose of Hib vaccine. Regardless of previous immunization, a recipient of a hematopoietic stem cell transplant should receive a  3-dose series 6-12 months after her successful transplant. Hib vaccine is not recommended for adults with HIV infection. Preventive Services / Frequency Ages 35 to 4 years  Blood pressure check.** / Every 3-5 years.  Lipid and cholesterol check.** / Every 5 years beginning at age 60.  Clinical breast exam.** / Every 3 years for women in their 71s and 10s.  BRCA-related cancer risk assessment.** / For women who have family members with a BRCA-related cancer (breast, ovarian, tubal, or peritoneal cancers).  Pap test.** / Every 2 years from ages 76 through 26. Every 3 years starting at age 61 through age 76 or 93 with a history of 3 consecutive normal Pap tests.  HPV screening.** / Every 3 years from ages 37 through ages 60 to 51 with a history of 3 consecutive normal Pap tests.  Hepatitis C blood test.** / For any individual with known risks for hepatitis C.  Skin self-exam. / Monthly.  Influenza vaccine. / Every year.  Tetanus, diphtheria, and acellular pertussis (Tdap, Td) vaccine.** / Consult your health care provider. Pregnant women should receive 1 dose of Tdap vaccine during each pregnancy. 1 dose of Td every 10 years.  Varicella vaccine.** / Consult your health care provider. Pregnant females who do not have evidence of immunity should receive the first dose after pregnancy.  HPV vaccine. / 3 doses over 6 months, if 93 and younger. The vaccine is not recommended for use in pregnant females. However, pregnancy testing is not needed before receiving a dose.  Measles, mumps, rubella (MMR) vaccine.** / You need at least 1 dose of MMR if you were born in 1957 or later. You may also need a 2nd dose. For females of childbearing age, rubella immunity should be determined. If there is no evidence of immunity, females who are not pregnant should be vaccinated. If there is no evidence of immunity, females who are  pregnant should delay immunization until after pregnancy.  Pneumococcal  13-valent conjugate (PCV13) vaccine.** / Consult your health care provider.  Pneumococcal polysaccharide (PPSV23) vaccine.** / 1 to 2 doses if you smoke cigarettes or if you have certain conditions.  Meningococcal vaccine.** / 1 dose if you are age 68 to 8 years and a Market researcher living in a residence hall, or have one of several medical conditions, you need to get vaccinated against meningococcal disease. You may also need additional booster doses.  Hepatitis A vaccine.** / Consult your health care provider.  Hepatitis B vaccine.** / Consult your health care provider.  Haemophilus influenzae type b (Hib) vaccine.** / Consult your health care provider. Ages 7 to 53 years  Blood pressure check.** / Every year.  Lipid and cholesterol check.** / Every 5 years beginning at age 25 years.  Lung cancer screening. / Every year if you are aged 11-80 years and have a 30-pack-year history of smoking and currently smoke or have quit within the past 15 years. Yearly screening is stopped once you have quit smoking for at least 15 years or develop a health problem that would prevent you from having lung cancer treatment.  Clinical breast exam.** / Every year after age 48 years.  BRCA-related cancer risk assessment.** / For women who have family members with a BRCA-related cancer (breast, ovarian, tubal, or peritoneal cancers).  Mammogram.** / Every year beginning at age 41 years and continuing for as long as you are in good health. Consult with your health care provider.  Pap test.** / Every 3 years starting at age 65 years through age 37 or 70 years with a history of 3 consecutive normal Pap tests.  HPV screening.** / Every 3 years from ages 72 years through ages 60 to 40 years with a history of 3 consecutive normal Pap tests.  Fecal occult blood test (FOBT) of stool. / Every year beginning at age 21 years and continuing until age 5 years. You may not need to do this test if you get  a colonoscopy every 10 years.  Flexible sigmoidoscopy or colonoscopy.** / Every 5 years for a flexible sigmoidoscopy or every 10 years for a colonoscopy beginning at age 35 years and continuing until age 48 years.  Hepatitis C blood test.** / For all people born from [birth year] through 1965 and any individual with known risks for hepatitis C.  Skin self-exam. / Monthly.  Influenza vaccine. / Every year.  Tetanus, diphtheria, and acellular pertussis (Tdap/Td) vaccine.** / Consult your health care provider. Pregnant women should receive 1 dose of Tdap vaccine during each pregnancy. 1 dose of Td every 10 years.  Varicella vaccine.** / Consult your health care provider. Pregnant females who do not have evidence of immunity should receive the first dose after pregnancy.  Zoster vaccine.** / 1 dose for adults aged 30 years or older.  Measles, mumps, rubella (MMR) vaccine.** / You need at least 1 dose of MMR if you were born in 1957 or later. You may also need a second dose. For females of childbearing age, rubella immunity should be determined. If there is no evidence of immunity, females who are not pregnant should be vaccinated. If there is no evidence of immunity, females who are pregnant should delay immunization until after pregnancy.  Pneumococcal 13-valent conjugate (PCV13) vaccine.** / Consult your health care provider.  Pneumococcal polysaccharide (PPSV23) vaccine.** / 1 to 2 doses if you smoke cigarettes or if you have certain conditions.  Meningococcal vaccine.** /  Consult your health care provider.  Hepatitis A vaccine.** / Consult your health care provider.  Hepatitis B vaccine.** / Consult your health care provider.  Haemophilus influenzae type b (Hib) vaccine.** / Consult your health care provider. Ages 64 years and over  Blood pressure check.** / Every year.  Lipid and cholesterol check.** / Every 5 years beginning at age 23 years.  Lung cancer screening. / Every year if you  are aged 16-80 years and have a 30-pack-year history of smoking and currently smoke or have quit within the past 15 years. Yearly screening is stopped once you have quit smoking for at least 15 years or develop a health problem that would prevent you from having lung cancer treatment.  Clinical breast exam.** / Every year after age 74 years.  BRCA-related cancer risk assessment.** / For women who have family members with a BRCA-related cancer (breast, ovarian, tubal, or peritoneal cancers).  Mammogram.** / Every year beginning at age 44 years and continuing for as long as you are in good health. Consult with your health care provider.  Pap test.** / Every 3 years starting at age 58 years through age 22 or 39 years with 3 consecutive normal Pap tests. Testing can be stopped between 65 and 70 years with 3 consecutive normal Pap tests and no abnormal Pap or HPV tests in the past 10 years.  HPV screening.** / Every 3 years from ages 64 years through ages 70 or 61 years with a history of 3 consecutive normal Pap tests. Testing can be stopped between 65 and 70 years with 3 consecutive normal Pap tests and no abnormal Pap or HPV tests in the past 10 years.  Fecal occult blood test (FOBT) of stool. / Every year beginning at age 40 years and continuing until age 27 years. You may not need to do this test if you get a colonoscopy every 10 years.  Flexible sigmoidoscopy or colonoscopy.** / Every 5 years for a flexible sigmoidoscopy or every 10 years for a colonoscopy beginning at age 7 years and continuing until age 32 years.  Hepatitis C blood test.** / For all people born from [birth year] through 1965 and any individual with known risks for hepatitis C.  Osteoporosis screening.** / A one-time screening for women ages 30 years and over and women at risk for fractures or osteoporosis.  Skin self-exam. / Monthly.  Influenza vaccine. / Every year.  Tetanus, diphtheria, and acellular pertussis (Tdap/Td)  vaccine.** / 1 dose of Td every 10 years.  Varicella vaccine.** / Consult your health care provider.  Zoster vaccine.** / 1 dose for adults aged 35 years or older.  Pneumococcal 13-valent conjugate (PCV13) vaccine.** / Consult your health care provider.  Pneumococcal polysaccharide (PPSV23) vaccine.** / 1 dose for all adults aged 46 years and older.  Meningococcal vaccine.** / Consult your health care provider.  Hepatitis A vaccine.** / Consult your health care provider.  Hepatitis B vaccine.** / Consult your health care provider.  Haemophilus influenzae type b (Hib) vaccine.** / Consult your health care provider. ** Family history and personal history of risk and conditions may change your health care provider's recommendations.   This information is not intended to replace advice given to you by your health care provider. Make sure you discuss any questions you have with your health care provider.   Document Released: 10/15/2001 Document Revised: 09/09/2014 Document Reviewed: 01/14/2011 Elsevier Interactive Patient Education [employer].

## 2015-10-13 LAB — URINE CULTURE: Colony Count: 35000

## 2015-10-15 ENCOUNTER — Encounter: Payer: Self-pay | Admitting: Family Medicine

## 2016-01-12 ENCOUNTER — Ambulatory Visit (INDEPENDENT_AMBULATORY_CARE_PROVIDER_SITE_OTHER): Payer: PRIVATE HEALTH INSURANCE | Admitting: Family Medicine

## 2016-01-12 ENCOUNTER — Encounter: Payer: Self-pay | Admitting: Family Medicine

## 2016-01-12 VITALS — BP 165/90 | HR 93 | Temp 101.0°F | Ht 65.5 in | Wt 211.0 lb

## 2016-01-12 DIAGNOSIS — J029 Acute pharyngitis, unspecified: Secondary | ICD-10-CM | POA: Diagnosis not present

## 2016-01-12 LAB — POCT RAPID STREP A (OFFICE): RAPID STREP A SCREEN: NEGATIVE

## 2016-01-12 MED ORDER — CEFTRIAXONE SODIUM 1 G IJ SOLR
1.0000 g | Freq: Once | INTRAMUSCULAR | Status: AC
  Administered 2016-01-12: 1 g via INTRAMUSCULAR

## 2016-01-12 MED ORDER — PREDNISOLONE SODIUM PHOSPHATE 5 MG/5ML PO SOLN: 10.0000 mg | Freq: Two times a day (BID) | ORAL | Status: DC

## 2016-01-12 MED ORDER — AMOXICILLIN-POT CLAVULANATE 875-125 MG PO TABS: 1.0000 | ORAL_TABLET | Freq: Two times a day (BID) | ORAL | Status: DC

## 2016-01-12 NOTE — Addendum Note (Signed)
Addended by: Emi Holes on: 01/12/2016 02:18 PM   Modules accepted: Miquel Dunn

## 2016-01-12 NOTE — Patient Instructions (Signed)

## 2016-01-12 NOTE — Addendum Note (Signed)
Addended by: Emi Holes on: 01/12/2016 02:16 PM   Modules accepted: Orders, SmartSet

## 2016-01-12 NOTE — Addendum Note (Signed)
Addended by: Emi Holes on: 01/12/2016 02:28 PM   Modules accepted: Miquel Dunn

## 2016-01-12 NOTE — Progress Notes (Signed)
Pre visit review using our clinic review tool, if applicable. No additional management support is needed unless otherwise documented below in the visit note. 

## 2016-01-12 NOTE — Progress Notes (Signed)
Subjective:    Patient ID: Yvonne Sanders, female    DOB: Nov 20, 1960, 55 y.o.   MRN: [id number]  HPI  Patient here for sore throat , fever, body aches , cough x 3 days.  Pt taking mucinex with no relief.    Past Medical History  Diagnosis Date  . Headache(784.0)   . Arthritis   . Glaucoma   . Endometriosis   . Hyperlipidemia   . 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 Abnormal pap 1989    cryo  . Chicken pox   . Migraine   . Positive TB test   . Hypertension          Review of Systems  Constitutional: Positive for fever, chills and fatigue. Negative for diaphoresis, appetite change and unexpected weight change.  HENT: Positive for ear pain and postnasal drip. Negative for ear discharge.   Eyes: Negative for pain, redness and visual disturbance.  Respiratory: Negative for cough, chest tightness, shortness of breath and wheezing.   Cardiovascular: Negative for chest pain, palpitations and leg swelling.  Endocrine: Negative for cold intolerance, heat intolerance, polydipsia, polyphagia and polyuria.  Genitourinary: Negative for dysuria, frequency and difficulty urinating.  Neurological: Negative for dizziness, light-headedness, numbness and headaches.       Objective:    Physical Exam  Constitutional: She is oriented to person, place, and time. She appears well-developed and well-nourished.  HENT:  Head: Normocephalic and atraumatic.  Eyes: Conjunctivae and EOM are normal.  Neck: Normal range of motion. Neck supple. No JVD present. Carotid bruit is not present. No thyromegaly present.  Cardiovascular: Normal rate, regular rhythm and normal heart sounds.   No murmur heard. Pulmonary/Chest: Effort normal and breath sounds normal. No respiratory distress. She has no wheezes. She has no rales. She exhibits no tenderness.  Musculoskeletal: She exhibits no edema.  Neurological: She is alert and oriented to person, place, and time.  Psychiatric: She has a normal mood  and affect. Her behavior is normal. Judgment and thought content normal.  Nursing note and vitals reviewed.   BP 165/90 mmHg  Pulse 93  Temp(Src) 101 F (38.3 C) (Oral)  Ht 5' 5.5" (1.664 m)  Wt 211 lb (95.709 kg)  BMI 34.57 kg/m2  SpO2 99%  LMP 09/03/2007 Wt Readings from Last 3 Encounters:  01/12/16 211 lb (95.709 kg)  10/12/15 209 lb 12.8 oz (95.165 kg)  07/20/15 211 lb 12.8 oz (96.072 kg)     Lab Results  Component Value Date   WBC 6.0 10/12/2015   HGB 11.9* 10/12/2015   HCT 38.3 10/12/2015   PLT 201.0 10/12/2015   GLUCOSE 92 10/12/2015   CHOL 181 10/12/2015   TRIG 75.0 10/12/2015   HDL 61.90 10/12/2015   LDLCALC 104* 10/12/2015   ALT 49* 10/12/2015   AST 36 10/12/2015   NA 143 10/12/2015   K 3.9 10/12/2015   CL 108 10/12/2015   CREATININE 0.65 10/12/2015   BUN 12 10/12/2015   CO2 30 10/12/2015   TSH 0.53 10/12/2015    Dg Foot Complete Left  12/26/2014  CLINICAL DATA:  Left foot pain/swelling at 5th metatarsal area, no known injury EXAM: LEFT FOOT - COMPLETE 3+ VIEW COMPARISON:  None. FINDINGS: No fracture or dislocation is seen. Accessory os navicularis. The joint spaces are preserved. Dorsal soft tissue swelling. IMPRESSION: No fracture or dislocation is seen. Accessory os navicularis. Dorsal soft tissue swelling. Electronically Signed   By: Henderson Newcomer.D.  On: 12/26/2014 15:41       Assessment & Plan:   Problem List Items Addressed This Visit    None    Visit Diagnoses    Acute pharyngitis, unspecified etiology    -  Primary    Relevant Medications    prednisoLONE sodium phosphate (PEDIAPRED) 6.7 (5 Base) MG/5ML SOLN    amoxicillin-clavulanate (AUGMENTIN) 875-125 MG tablet    cefTRIAXone (ROCEPHIN) injection 1 g (Completed)    Other Relevant Orders    POCT rapid strep A (Completed)    POCT Rapid Influenza A&B (Completed)    POCT Rapid Influenza A&B     it any inc fever, headaches or neck pain , go to er. --- pt understands Patient given  rocephin 1 g IM Pt unable to take steroid shots because of her glaucoma but can have pediapred -- short course rto prn   Ann Held, DO

## 2016-01-15 ENCOUNTER — Ambulatory Visit (HOSPITAL_BASED_OUTPATIENT_CLINIC_OR_DEPARTMENT_OTHER)
Admission: RE | Admit: 2016-01-15 | Discharge: 2016-01-15 | Disposition: A | Discharge status: Home / Self Care | Payer: PRIVATE HEALTH INSURANCE | Source: Ambulatory Visit | Attending: Family Medicine | Admitting: Family Medicine

## 2016-01-15 ENCOUNTER — Encounter: Payer: Self-pay | Admitting: Family Medicine

## 2016-01-15 ENCOUNTER — Ambulatory Visit (INDEPENDENT_AMBULATORY_CARE_PROVIDER_SITE_OTHER): Payer: PRIVATE HEALTH INSURANCE | Admitting: Family Medicine

## 2016-01-15 VITALS — BP 126/70 | HR 74 | Temp 98.5°F | Ht 65.5 in | Wt 209.0 lb

## 2016-01-15 DIAGNOSIS — J9811 Atelectasis: Secondary | ICD-10-CM | POA: Insufficient documentation

## 2016-01-15 DIAGNOSIS — R05 Cough: Secondary | ICD-10-CM

## 2016-01-15 DIAGNOSIS — J209 Acute bronchitis, unspecified: Secondary | ICD-10-CM | POA: Diagnosis not present

## 2016-01-15 DIAGNOSIS — R059 Cough, unspecified: Secondary | ICD-10-CM

## 2016-01-15 MED ORDER — BENZONATATE 100 MG PO CAPS: ORAL_CAPSULE | ORAL | Status: DC

## 2016-01-15 MED ORDER — CLARITHROMYCIN ER 500 MG PO TB24: 1000.0000 mg | ORAL_TABLET | Freq: Every day | ORAL | Status: AC

## 2016-01-15 NOTE — Progress Notes (Signed)
Pre visit review using our clinic review tool, if applicable. No additional management support is needed unless otherwise documented below in the visit note. 

## 2016-01-15 NOTE — Progress Notes (Deleted)
   Subjective:    Patient ID: Yvonne Sanders, female    DOB: 07-10-1961, 55 y.o.   MRN: [id number]  HPI    Review of Systems     Objective:   Physical Exam        Assessment & Plan:

## 2016-01-15 NOTE — Patient Instructions (Signed)
Stop the augmentin Start biaxin but hold the crestor while you are on the biaxin Tessalon perles have been sent to the pharmacy for you Get the chest xray downstairs We will call you with the results.

## 2016-01-15 NOTE — Progress Notes (Signed)
Subjective:    Patient ID: Yvonne Sanders, female    DOB: September 17, 1960, 55 y.o.   MRN: [id number]  Chief Complaint  Patient presents with  . Follow-up    on acute pharyngitis-pt states the throat-still pharyngitis-pt states the medication is causing diarrhea    HPI Patient is in today for f/u pharyngitis.  Her throat is better but still burns and now she is coughing a lot.  The augmentin is causing diarrhea.  No more fever.  See last ov.  She also has a hx of + PPD and is concerned because it has been a while since she had a cxr.    Past Medical History  Diagnosis Date  . Headache(784.0)   . Arthritis   . Glaucoma   . Endometriosis   . Hyperlipidemia   . 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 Abnormal pap 1989    cryo  . Chicken pox   . Migraine   . Positive TB test   . Hypertension          Past Surgical History  Procedure Laterality Date  . Back surgery  99    HNP  . Cholecystectomy  88  . Diagnostic laparoscopy  88    endometrious  . Appendectomy  75  . Right oophorectomy  75  . Eye surgery  12    laser for glaucoma  . Breast biopsy      benign  . Lumbar fusion  08/2011    l 3-4  . Lumbar fusion      l 4-5    Family History  Problem Relation Age of Onset  . Hypertension Father   . Glaucoma Father   . Prostate cancer Father   . Renal Disease Father   . Macular degeneration Father   . Stroke Father   . Kidney disease Father   . Heart disease Father   . Breast cancer Maternal Grandmother   . Breast cancer Maternal Aunt     Social History   Social History  . Marital Status: Married    Spouse Name: N/A  . Number of Children: N/A  . Years of Education: N/A   Occupational History  . Not on file.   Social History Main Topics  . Smoking status: Never Smoker   . Smokeless tobacco: Never Used  . Alcohol Use: Yes     Comment: occ. wine  . Drug Use: No  . Sexual Activity:    Partners: Male    Birth Control/ Protection: Post-menopausal    Other Topics Concern  . Not on file   Social History Narrative    Outpatient Prescriptions Prior to Visit  Medication Sig Dispense Refill  . aspirin 325 MG tablet Take 325 mg by mouth daily. Pt doesn't take when she takes Excedrin    . aspirin-acetaminophen-caffeine (EXCEDRIN MIGRAINE) 250-250-65 MG tablet Take 1 tablet by mouth every 6 (six) hours as needed for headache or migraine.    . Biotin 5000 MCG CAPS Take 5,000 mcg by mouth daily.      . Brinzolamide-Brimonidine (SIMBRINZA) 1-0.2 % SUSP Apply 1 drop to eye 2 (two) times daily.    . Calcium Carbonate Antacid (TUMS PO) Take 1 tablet by mouth daily as needed (heartburn).     . Carboxymethylcellul-Glycerin 0.5-0.9 % SOLN Apply 1 drop to eye daily as needed (eyes).     . Cholecalciferol (VITAMIN D-3) 5000 UNITS TABS Take 1 tablet by mouth 3 (three) times a  week.     . cyclobenzaprine (FLEXERIL) 10 MG tablet take 1 tablet by mouth three times a day if needed for muscle spasm 30 tablet 2  . cyclopentolate (CYCLODRYL,CYCLOGYL) 2 % ophthalmic solution INT 1 GTT IN OS BID  1  . esomeprazole (NEXIUM) 40 MG capsule Take 1 capsule (40 mg total) by mouth daily. 30 capsule 3  . Eszopiclone 3 MG TABS Take 1 tablet by mouth as needed.    . ezetimibe (ZETIA) 10 MG tablet Take 1 tablet (10 mg total) by mouth daily. 30 tablet 2  . folic acid (FOLVITE) [id number] MCG tablet Take 400 mcg by mouth daily.      Marland Kitchen HYDROcodone-acetaminophen (NORCO/VICODIN) 5-325 MG tablet Take 1 tablet by mouth every 6 (six) hours as needed for moderate pain. 30 tablet 0  . ibuprofen (ADVIL,MOTRIN) 800 MG tablet Take 800 mg by mouth every 8 (eight) hours as needed. For pain     . Krill Oil 300 MG CAPS Take 1 capsule by mouth daily.     Marland Kitchen latanoprost (XALATAN) 0.005 % ophthalmic solution Apply 1 drop to eye daily.    . metoprolol succinate (TOPROL XL) 25 MG 24 hr tablet Take 1 tablet (25 mg total) by mouth daily. 30 tablet 11  . Multiple Vitamins-Minerals (MULTIVITAMINS THER.  W/MINERALS) TABS Take 1 tablet by mouth daily.      . prednisoLONE sodium phosphate (PEDIAPRED) 6.7 (5 Base) MG/5ML SOLN Take 10 mLs (10 mg total) by mouth 2 (two) times daily after a meal. 100 mL 0  . rosuvastatin (CRESTOR) 40 MG tablet Take 1 tablet (40 mg total) by mouth daily. 30 tablet 2  . spironolactone (ALDACTONE) 100 MG tablet Take 1 tablet (100 mg total) by mouth as needed. 30 tablet 1  . amoxicillin-clavulanate (AUGMENTIN) 875-125 MG tablet Take 1 tablet by mouth 2 (two) times daily. 20 tablet 0   No facility-administered medications prior to visit.    Allergies  Allergen Reactions  . Nubain [Nalbuphine Hcl] Anaphylaxis  . Corticosteroids Other (See Comments)    Due to glaucoma can not have steroids  . Dilaudid [Hydromorphone Hcl] Nausea And Vomiting    Severe itching  . Nubain [Nalbuphine Hcl]   . Oxycodone-Acetaminophen Itching  . Percodan [Oxycodone-Aspirin] Hives and Itching  . Tomato Rash    Rash, spots on tongue    Review of Systems  Constitutional: Positive for chills. Negative for fever and malaise/fatigue.  HENT: Positive for sore throat. Negative for congestion and hearing loss.   Eyes: Negative for discharge.  Respiratory: Positive for cough. Negative for sputum production, shortness of breath and wheezing.   Cardiovascular: Negative for chest pain, palpitations and leg swelling.  Gastrointestinal: Negative for heartburn, nausea, vomiting, abdominal pain, diarrhea, constipation and blood in stool.  Genitourinary: Negative for dysuria, urgency, frequency and hematuria.  Musculoskeletal: Negative for myalgias, back pain and falls.  Skin: Negative for rash.  Neurological: Negative for dizziness, sensory change, loss of consciousness, weakness and headaches.  Endo/Heme/Allergies: Negative for environmental allergies. Does not bruise/bleed easily.  Psychiatric/Behavioral: Negative for depression and suicidal ideas. The patient is not nervous/anxious and does not  have insomnia.        Objective:    Physical Exam  Constitutional: She is oriented to person, place, and time. She appears well-developed and well-nourished.  HENT:  Head: Normocephalic and atraumatic.  Mouth/Throat: Posterior oropharyngeal erythema present.  Eyes: Conjunctivae and EOM are normal.  Neck: Normal range of motion. Neck supple. No JVD present. Carotid  bruit is not present. No thyromegaly present.  Cardiovascular: Normal rate, regular rhythm and normal heart sounds.   No murmur heard. Pulmonary/Chest: Effort normal and breath sounds normal. No respiratory distress. She has no wheezes. She has no rales. She exhibits no tenderness.  Musculoskeletal: She exhibits no edema.  Neurological: She is alert and oriented to person, place, and time.  Psychiatric: She has a normal mood and affect.  Nursing note and vitals reviewed.   BP 126/70 mmHg  Pulse 74  Temp(Src) 98.5 F (36.9 C) (Oral)  Ht 5' 5.5" (1.664 m)  Wt 209 lb (94.802 kg)  BMI 34.24 kg/m2  SpO2 98%  LMP 09/03/2007 Wt Readings from Last 3 Encounters:  01/15/16 209 lb (94.802 kg)  01/12/16 211 lb (95.709 kg)  10/12/15 209 lb 12.8 oz (95.165 kg)     Lab Results  Component Value Date   WBC 6.0 10/12/2015   HGB 11.9* 10/12/2015   HCT 38.3 10/12/2015   PLT 201.0 10/12/2015   GLUCOSE 92 10/12/2015   CHOL 181 10/12/2015   TRIG 75.0 10/12/2015   HDL 61.90 10/12/2015   LDLCALC 104* 10/12/2015   ALT 49* 10/12/2015   AST 36 10/12/2015   NA 143 10/12/2015   K 3.9 10/12/2015   CL 108 10/12/2015   CREATININE 0.65 10/12/2015   BUN 12 10/12/2015   CO2 30 10/12/2015   TSH 0.53 10/12/2015    Lab Results  Component Value Date   TSH 0.53 10/12/2015   Lab Results  Component Value Date   WBC 6.0 10/12/2015   HGB 11.9* 10/12/2015   HCT 38.3 10/12/2015   MCV 84.1 10/12/2015   PLT 201.0 10/12/2015   Lab Results  Component Value Date   NA 143 10/12/2015   K 3.9 10/12/2015   CO2 30 10/12/2015   GLUCOSE  92 10/12/2015   BUN 12 10/12/2015   CREATININE 0.65 10/12/2015   BILITOT 0.5 10/12/2015   ALKPHOS 68 10/12/2015   AST 36 10/12/2015   ALT 49* 10/12/2015   PROT 7.6 10/12/2015   ALBUMIN 4.2 10/12/2015   CALCIUM 9.9 10/12/2015   GFR 121.98 10/12/2015   Lab Results  Component Value Date   CHOL 181 10/12/2015   Lab Results  Component Value Date   HDL 61.90 10/12/2015   Lab Results  Component Value Date   LDLCALC 104* 10/12/2015   Lab Results  Component Value Date   TRIG 75.0 10/12/2015   Lab Results  Component Value Date   CHOLHDL 3 10/12/2015   No results found for: HGBA1C     Assessment & Plan:   Problem List Items Addressed This Visit    None    Visit Diagnoses    Cough    -  Primary    Relevant Medications    benzonatate (TESSALON PERLES) 100 MG capsule    Other Relevant Orders    DG Chest 2 View (Completed)    Culture, Group A Strep    Acute bronchitis, unspecified organism        Relevant Medications    clarithromycin (BIAXIN XL) 500 MG 24 hr tablet    Other Relevant Orders    Culture, Group A Strep       I have discontinued Yvonne Sanders's amoxicillin-clavulanate. I am also having her start on benzonatate and clarithromycin. Additionally, I am having her maintain her multivitamins ther. w/minerals, folic acid, Biotin, ibuprofen, Calcium Carbonate Antacid (TUMS PO), aspirin, Vitamin D-3, Brinzolamide-Brimonidine, Carboxymethylcellul-Glycerin, Eszopiclone, latanoprost, Krill Oil, spironolactone, esomeprazole, aspirin-acetaminophen-caffeine,  metoprolol succinate, cyclobenzaprine, cyclopentolate, ezetimibe, rosuvastatin, HYDROcodone-acetaminophen, and prednisoLONE sodium phosphate.  Meds ordered this encounter  Medications  . benzonatate (TESSALON PERLES) 100 MG capsule    Sig: 1-2 cap po tid prn cough    Dispense:  40 capsule    Refill:  0  . clarithromycin (BIAXIN XL) 500 MG 24 hr tablet    Sig: Take 2 tablets (1,000 mg total) by mouth daily.     Dispense:  14 tablet    Refill:  0    Hold crestor while on biaxin     [employer], DO-

## 2016-01-17 LAB — CULTURE, GROUP A STREP: Organism ID, Bacteria: NORMAL

## 2016-01-31 ENCOUNTER — Encounter: Payer: Self-pay | Admitting: Medical

## 2016-01-31 ENCOUNTER — Ambulatory Visit (INDEPENDENT_AMBULATORY_CARE_PROVIDER_SITE_OTHER): Payer: PRIVATE HEALTH INSURANCE | Admitting: Medical

## 2016-01-31 VITALS — BP 130/78 | HR 67 | Temp 99.7°F | Ht 65.0 in | Wt 207.8 lb

## 2016-01-31 DIAGNOSIS — R197 Diarrhea, unspecified: Secondary | ICD-10-CM

## 2016-01-31 DIAGNOSIS — B349 Viral infection, unspecified: Secondary | ICD-10-CM

## 2016-01-31 DIAGNOSIS — R112 Nausea with vomiting, unspecified: Secondary | ICD-10-CM | POA: Diagnosis not present

## 2016-01-31 DIAGNOSIS — R5383 Other fatigue: Secondary | ICD-10-CM | POA: Diagnosis not present

## 2016-01-31 LAB — POC INFLUENZA A&B (BINAX/QUICKVUE)
INFLUENZA B, POC: NEGATIVE
Influenza A, POC: NEGATIVE

## 2016-01-31 LAB — CBC WITH DIFFERENTIAL/PLATELET
Basophils Absolute: 0 10*3/uL (ref 0.0–0.1)
Basophils Relative: 0.1 % (ref 0.0–3.0)
EOS ABS: 0 10*3/uL (ref 0.0–0.7)
EOS PCT: 0.1 % (ref 0.0–5.0)
HCT: 38.7 % (ref 36.0–46.0)
HEMOGLOBIN: 12.2 g/dL (ref 12.0–15.0)
LYMPHS ABS: 0.6 10*3/uL — AB (ref 0.7–4.0)
Lymphocytes Relative: 12.2 % (ref 12.0–46.0)
MCHC: 31.5 g/dL (ref 30.0–36.0)
MCV: 84.3 fl (ref 78.0–100.0)
MONO ABS: 0.3 10*3/uL (ref 0.1–1.0)
Monocytes Relative: 6 % (ref 3.0–12.0)
Neutro Abs: 4.3 10*3/uL (ref 1.4–7.7)
Neutrophils Relative %: 81.6 % — ABNORMAL HIGH (ref 43.0–77.0)
Platelets: 211 10*3/uL (ref 150.0–400.0)
RBC: 4.59 Mil/uL (ref 3.87–5.11)
RDW: 14.2 % (ref 11.5–15.5)
WBC: 5.3 10*3/uL (ref 4.0–10.5)

## 2016-01-31 LAB — COMPREHENSIVE METABOLIC PANEL
ALT: 47 U/L — ABNORMAL HIGH (ref 0–35)
AST: 26 U/L (ref 0–37)
Albumin: 4.3 g/dL (ref 3.5–5.2)
Alkaline Phosphatase: 67 U/L (ref 39–117)
BUN: 6 mg/dL (ref 6–23)
CHLORIDE: 104 meq/L (ref 96–112)
CO2: 26 mEq/L (ref 19–32)
CREATININE: 0.61 mg/dL (ref 0.40–1.20)
Calcium: 9.2 mg/dL (ref 8.4–10.5)
GFR: 131.11 mL/min (ref >60.00)
Glucose, Bld: 94 mg/dL (ref 70–99)
Potassium: 3.7 mEq/L (ref 3.5–5.1)
SODIUM: 137 meq/L (ref 135–145)
TOTAL PROTEIN: 7.4 g/dL (ref 6.0–8.3)
Total Bilirubin: 0.6 mg/dL (ref 0.2–1.2)

## 2016-01-31 LAB — AMYLASE: Amylase: 38 U/L (ref 27–131)

## 2016-01-31 LAB — LIPASE: Lipase: <3 U/L — ABNORMAL LOW (ref 11.0–59.0)

## 2016-01-31 MED ORDER — PROMETHAZINE HCL 12.5 MG PO TABS: 12.5000 mg | ORAL_TABLET | Freq: Three times a day (TID) | ORAL | Status: DC | PRN

## 2016-01-31 MED ORDER — KETOROLAC TROMETHAMINE 60 MG/2ML IM SOLN
60.0000 mg | Freq: Once | INTRAMUSCULAR | Status: AC
  Administered 2016-01-31: 60 mg via INTRAMUSCULAR

## 2016-01-31 NOTE — Patient Instructions (Addendum)
We did flu test today and study was negative. Flu no prevalent recently but did study to make sure.  For diarrhea recommend immodium otc. Hydrate well with propel. Bland diet. Also will go ahead and get stool panel studies. To include c dif since you had been on 2 antibiotics in past 2 weeks. If diarrhea still by Friday and panel not back then will need to rx flagyl.  For nausea or vomiting rx phenergan.  For fatigue and faint abdomen discomfort will go ahead and get cbc, cmp, amylase and lipase.  Follow up in 5-7 days or as needed.

## 2016-01-31 NOTE — Progress Notes (Signed)
Pre visit review using our clinic review tool, if applicable. No additional management support is needed unless otherwise documented below in the visit note. 

## 2016-01-31 NOTE — Progress Notes (Addendum)
Subjective:    Patient ID: Yvonne Sanders, female    DOB: 1961/02/19, 55 y.o.   MRN: [id number]  HPI  Pt in with recent symptoms of nausea on Monday. Pt states on Tuesday started with vomiting, nausea, abdomen pain, fever, chills and bodyaches.  Pt denies any family or friends sick like this. Pt did finished biaxin. She was on augmentin before she was on augmentin. Was on augmentin for 3 days but had to stop due to diarrhea.  Diarrhea started last night and about 6-7 times. Very watery loose stool.    Review of Systems  Constitutional: Positive for fever, chills and fatigue.  Respiratory: Negative for cough, choking, shortness of breath and wheezing.   Cardiovascular: Negative for chest pain and palpitations.  Gastrointestinal: Positive for nausea, vomiting, abdominal pain and diarrhea. Negative for rectal pain.       Very faint mild abdomen pain/queezy sensation. Not severe pain.   Musculoskeletal: Positive for myalgias.  Hematological: Negative for adenopathy. Does not bruise/bleed easily.  Psychiatric/Behavioral: Negative for behavioral problems and confusion.    Past Medical History  Diagnosis Date  . Headache(784.0)   . Arthritis   . Glaucoma   . Endometriosis   . Hyperlipidemia   . 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 Abnormal pap 1989    cryo  . Chicken pox   . Migraine   . Positive TB test   . Hypertension           Social History   Social History  . Marital Status: Married    Spouse Name: N/A  . Number of Children: N/A  . Years of Education: N/A   Occupational History  . Not on file.   Social History Main Topics  . Smoking status: Never Smoker   . Smokeless tobacco: Never Used  . Alcohol Use: Yes     Comment: occ. wine  . Drug Use: No  . Sexual Activity:    Partners: Male    Birth Control/ Protection: Post-menopausal   Other Topics Concern  . Not on file   Social History Narrative    Past Surgical History  Procedure Laterality  Date  . Back surgery  99    HNP  . Cholecystectomy  88  . Diagnostic laparoscopy  88    endometrious  . Appendectomy  75  . Right oophorectomy  75  . Eye surgery  12    laser for glaucoma  . Breast biopsy      benign  . Lumbar fusion  08/2011    l 3-4  . Lumbar fusion      l 4-5    Family History  Problem Relation Age of Onset  . Hypertension Father   . Glaucoma Father   . Prostate cancer Father   . Renal Disease Father   . Macular degeneration Father   . Stroke Father   . Kidney disease Father   . Heart disease Father   . Breast cancer Maternal Grandmother   . Breast cancer Maternal Aunt     Allergies  Allergen Reactions  . Nubain [Nalbuphine Hcl] Anaphylaxis  . Corticosteroids Other (See Comments)    Due to glaucoma can not have steroids  . Dilaudid [Hydromorphone Hcl] Nausea And Vomiting    Severe itching  . Nubain [Nalbuphine Hcl]   . Oxycodone-Acetaminophen Itching  . Percodan [Oxycodone-Aspirin] Hives and Itching  . Tomato Rash    Rash, spots on tongue    Current  Outpatient Prescriptions on File Prior to Visit  Medication Sig Dispense Refill  . aspirin 325 MG tablet Take 325 mg by mouth daily. Pt doesn't take when she takes Excedrin    . aspirin-acetaminophen-caffeine (EXCEDRIN MIGRAINE) 250-250-65 MG tablet Take 1 tablet by mouth every 6 (six) hours as needed for headache or migraine.    . benzonatate (TESSALON PERLES) 100 MG capsule 1-2 cap po tid prn cough 40 capsule 0  . Biotin 5000 MCG CAPS Take 5,000 mcg by mouth daily.      . Brinzolamide-Brimonidine (SIMBRINZA) 1-0.2 % SUSP Apply 1 drop to eye 2 (two) times daily.    . Calcium Carbonate Antacid (TUMS PO) Take 1 tablet by mouth daily as needed (heartburn).     . Carboxymethylcellul-Glycerin 0.5-0.9 % SOLN Apply 1 drop to eye daily as needed (eyes).     . Cholecalciferol (VITAMIN D-3) 5000 UNITS TABS Take 1 tablet by mouth 3 (three) times a week.     . cyclobenzaprine (FLEXERIL) 10 MG tablet take 1  tablet by mouth three times a day if needed for muscle spasm 30 tablet 2  . cyclopentolate (CYCLODRYL,CYCLOGYL) 2 % ophthalmic solution INT 1 GTT IN OS BID  1  . esomeprazole (NEXIUM) 40 MG capsule Take 1 capsule (40 mg total) by mouth daily. 30 capsule 3  . Eszopiclone 3 MG TABS Take 1 tablet by mouth as needed.    . ezetimibe (ZETIA) 10 MG tablet Take 1 tablet (10 mg total) by mouth daily. 30 tablet 2  . folic acid (FOLVITE) Q000111Q MCG tablet Take 400 mcg by mouth daily.      Marland Kitchen HYDROcodone-acetaminophen (NORCO/VICODIN) 5-325 MG tablet Take 1 tablet by mouth every 6 (six) hours as needed for moderate pain. 30 tablet 0  . ibuprofen (ADVIL,MOTRIN) 800 MG tablet Take 800 mg by mouth every 8 (eight) hours as needed. For pain     . Krill Oil 300 MG CAPS Take 1 capsule by mouth daily.     Marland Kitchen latanoprost (XALATAN) 0.005 % ophthalmic solution Apply 1 drop to eye daily.    . metoprolol succinate (TOPROL XL) 25 MG 24 hr tablet Take 1 tablet (25 mg total) by mouth daily. 30 tablet 11  . Multiple Vitamins-Minerals (MULTIVITAMINS THER. W/MINERALS) TABS Take 1 tablet by mouth daily.      . prednisoLONE sodium phosphate (PEDIAPRED) 6.7 (5 Base) MG/5ML SOLN Take 10 mLs (10 mg total) by mouth 2 (two) times daily after a meal. 100 mL 0  . rosuvastatin (CRESTOR) 40 MG tablet Take 1 tablet (40 mg total) by mouth daily. 30 tablet 2  . spironolactone (ALDACTONE) 100 MG tablet Take 1 tablet (100 mg total) by mouth as needed. 30 tablet 1   No current facility-administered medications on file prior to visit.    BP 130/78 mmHg  Pulse 67  Temp(Src) 99.7 F (37.6 C) (Oral)  Ht 5\' 5"  (1.651 m)  Wt 207 lb 12.8 oz (94.257 kg)  BMI 34.58 kg/m2  SpO2 98%  LMP 09/03/2007       Objective:   Physical Exam  General Appearance- Not in acute distress.  HEENT Eyes- Scleraeral/Conjuntiva-bilat- Not Yellow. Mouth & Throat- Normal.  Chest and Lung Exam Auscultation: Breath sounds:-Normal. Adventitious sounds:- No  Adventitious sounds.  Cardiovascular Auscultation:Rythm - Regular. Heart Sounds -Normal heart sounds.  Abdomen Inspection:-Inspection Normal.  Palpation/Perucssion: Palpation and Percussion of the abdomen reveal- Non Tender, No Rebound tenderness, No rigidity(Guarding) and No Palpable abdominal masses.  Liver:-Normal.  Spleen:- Normal.  Back- no cva tenderness.  Neuro CN III- XII grossly intact.         Assessment & Plan:  We did flu test today and study was negative. Flu no prevalent recently but did study to make sure.  For diarrhea recommend immodium otc. Hydrate well with propel. Bland diet. Also will go ahead and get stool panel studies. To include c dif since you had been on 2 antibiotics in past 2 weeks. If diarrhea still by Friday and panel not back then will need to rx flagyl.  For nausea or vomiting rx phenergan.  For fatigue and faint abdomen discomfort will go ahead and get cbc, cmp, amylase and lipase.  Follow up in 5-7 days or as needed.  Return to work on June 2 or February 05, 2016. Depends on how she feels both notes given.  For ha and myalgias will give toradol 60 mg im today.  Euriah Matlack, Percell Miller, PA-C

## 2016-01-31 NOTE — Addendum Note (Signed)
Addended by: Tasia Catchings on: 01/31/2016 10:58 AM   Modules accepted: Orders

## 2016-02-01 ENCOUNTER — Telehealth: Payer: Self-pay | Admitting: Medical

## 2016-02-01 MED ORDER — MELOXICAM 7.5 MG PO TABS: ORAL_TABLET | ORAL | Status: DC

## 2016-02-01 NOTE — Telephone Encounter (Signed)
rx meloxicam sent in.

## 2016-02-02 ENCOUNTER — Encounter: Payer: Self-pay | Admitting: Medical

## 2016-02-08 ENCOUNTER — Ambulatory Visit: Payer: PRIVATE HEALTH INSURANCE | Admitting: Medical

## 2016-03-14 ENCOUNTER — Ambulatory Visit: Payer: PRIVATE HEALTH INSURANCE | Admitting: Obstetrics and Gynecology

## 2016-03-14 ENCOUNTER — Telehealth: Payer: Self-pay | Admitting: Obstetrics and Gynecology

## 2016-03-14 NOTE — Telephone Encounter (Signed)
Patient Yvonne [facility] her appointment for today. She was in the hospital with her father who had a stroke last night. She will call back to reschedule patient is in recall.

## 2016-03-14 NOTE — Telephone Encounter (Signed)
Thank you.  Encounter is closed.

## 2016-04-11 ENCOUNTER — Ambulatory Visit (INDEPENDENT_AMBULATORY_CARE_PROVIDER_SITE_OTHER): Payer: PRIVATE HEALTH INSURANCE | Admitting: Family Medicine

## 2016-04-11 ENCOUNTER — Encounter: Payer: Self-pay | Admitting: Family Medicine

## 2016-04-11 VITALS — BP 122/88 | HR 81 | Temp 98.3°F | Ht 65.0 in | Wt 206.4 lb

## 2016-04-11 DIAGNOSIS — M5440 Lumbago with sciatica, unspecified side: Secondary | ICD-10-CM | POA: Diagnosis not present

## 2016-04-11 DIAGNOSIS — E785 Hyperlipidemia, unspecified: Secondary | ICD-10-CM | POA: Diagnosis not present

## 2016-04-11 DIAGNOSIS — I10 Essential (primary) hypertension: Secondary | ICD-10-CM

## 2016-04-11 LAB — POCT URINALYSIS DIPSTICK
Bilirubin, UA: NEGATIVE
Glucose, UA: NEGATIVE
Ketones, UA: NEGATIVE
LEUKOCYTES UA: NEGATIVE
NITRITE UA: NEGATIVE
PH UA: 7
Protein, UA: NEGATIVE
RBC UA: NEGATIVE
Spec Grav, UA: 1.015
UROBILINOGEN UA: 0.2

## 2016-04-11 LAB — COMPREHENSIVE METABOLIC PANEL
ALBUMIN: 4.3 g/dL (ref 3.5–5.2)
ALK PHOS: 66 U/L (ref 39–117)
ALT: 23 U/L (ref 0–35)
AST: 22 U/L (ref 0–37)
BUN: 9 mg/dL (ref 6–23)
CHLORIDE: 107 meq/L (ref 96–112)
CO2: 29 mEq/L (ref 19–32)
Calcium: 9.9 mg/dL (ref 8.4–10.5)
Creatinine, Ser: 0.65 mg/dL (ref 0.40–1.20)
GFR: 121.75 mL/min (ref >60.00)
Glucose, Bld: 100 mg/dL — ABNORMAL HIGH (ref 70–99)
POTASSIUM: 4.2 meq/L (ref 3.5–5.1)
SODIUM: 141 meq/L (ref 135–145)
TOTAL PROTEIN: 7.6 g/dL (ref 6.0–8.3)
Total Bilirubin: 0.6 mg/dL (ref 0.2–1.2)

## 2016-04-11 LAB — LIPID PANEL
CHOLESTEROL: 272 mg/dL — AB (ref 0–200)
HDL: 63.6 mg/dL (ref >39.00)
LDL CALC: 194 mg/dL — AB (ref 0–99)
NonHDL: 208.73
Total CHOL/HDL Ratio: 4
Triglycerides: 75 mg/dL (ref 0.0–149.0)
VLDL: 15 mg/dL (ref 0.0–40.0)

## 2016-04-11 MED ORDER — EZETIMIBE 10 MG PO TABS: 10.0000 mg | ORAL_TABLET | Freq: Every day | ORAL | 1 refills | Status: DC

## 2016-04-11 MED ORDER — HYDROCODONE-ACETAMINOPHEN 5-325 MG PO TABS: 1.0000 | ORAL_TABLET | Freq: Four times a day (QID) | ORAL | 0 refills | Status: DC | PRN

## 2016-04-11 MED ORDER — CYCLOBENZAPRINE HCL 10 MG PO TABS: ORAL_TABLET | ORAL | 2 refills | Status: DC

## 2016-04-11 MED ORDER — ROSUVASTATIN CALCIUM 40 MG PO TABS: 40.0000 mg | ORAL_TABLET | Freq: Every day | ORAL | 1 refills | Status: DC

## 2016-04-11 NOTE — Progress Notes (Signed)
Pre visit review using our clinic review tool, if applicable. No additional management support is needed unless otherwise documented below in the visit note. 

## 2016-04-11 NOTE — Assessment & Plan Note (Signed)
Stable Dash diet

## 2016-04-11 NOTE — Patient Instructions (Signed)
Hypertension Hypertension, commonly called high blood pressure, is when the force of blood pumping through your arteries is too strong. Your arteries are the blood vessels that carry blood from your heart throughout your body. A blood pressure reading consists of a higher number over a lower number, such as 110/72. The higher number (systolic) is the pressure inside your arteries when your heart pumps. The lower number (diastolic) is the pressure inside your arteries when your heart relaxes. Ideally you want your blood pressure below 120/80. Hypertension forces your heart to work harder to pump blood. Your arteries may become narrow or stiff. Having untreated or uncontrolled hypertension can cause heart attack, stroke, kidney disease, and other problems. RISK FACTORS Some risk factors for high blood pressure are controllable. Others are not.  Risk factors you cannot control include:   Race. You may be at higher risk if you are African American.  Age. Risk increases with age.  Gender. Men are at higher risk than women before age 45 years. After age 65, women are at higher risk than men. Risk factors you can control include:  Not getting enough exercise or physical activity.  Being overweight.  Getting too much fat, sugar, calories, or salt in your diet.  Drinking too much alcohol. SIGNS AND SYMPTOMS Hypertension does not usually cause signs or symptoms. Extremely high blood pressure (hypertensive crisis) may cause headache, anxiety, shortness of breath, and nosebleed. DIAGNOSIS To check if you have hypertension, your health care provider will measure your blood pressure while you are seated, with your arm held at the level of your heart. It should be measured at least twice using the same arm. Certain conditions can cause a difference in blood pressure between your right and left arms. A blood pressure reading that is higher than normal on one occasion does not mean that you need treatment. If  it is not clear whether you have high blood pressure, you may be asked to return on a different day to have your blood pressure checked again. Or, you may be asked to monitor your blood pressure at home for 1 or more weeks. TREATMENT Treating high blood pressure includes making lifestyle changes and possibly taking medicine. Living a healthy lifestyle can help lower high blood pressure. You may need to change some of your habits. Lifestyle changes may include:  Following the DASH diet. This diet is high in fruits, vegetables, and whole grains. It is low in salt, red meat, and added sugars.  Keep your sodium intake below 2,300 mg per day.  Getting at least 30-45 minutes of aerobic exercise at least 4 times per week.  Losing weight if necessary.  Not smoking.  Limiting alcoholic beverages.  Learning ways to reduce stress. Your health care provider may prescribe medicine if lifestyle changes are not enough to get your blood pressure under control, and if one of the following is true:  You are 18-59 years of age and your systolic blood pressure is above 140.  You are 60 years of age or older, and your systolic blood pressure is above 150.  Your diastolic blood pressure is above 90.  You have diabetes, and your systolic blood pressure is over 140 or your diastolic blood pressure is over 90.  You have kidney disease and your blood pressure is above 140/90.  You have heart disease and your blood pressure is above 140/90. Your personal target blood pressure may vary depending on your medical conditions, your age, and other factors. HOME CARE INSTRUCTIONS    Have your blood pressure rechecked as directed by your health care provider.   Take medicines only as directed by your health care provider. Follow the directions carefully. Blood pressure medicines must be taken as prescribed. The medicine does not work as well when you skip doses. Skipping doses also puts you at risk for  problems.  Do not smoke.   Monitor your blood pressure at home as directed by your health care provider. SEEK MEDICAL CARE IF:   You think you are having a reaction to medicines taken.  You have recurrent headaches or feel dizzy.  You have swelling in your ankles.  You have trouble with your vision. SEEK IMMEDIATE MEDICAL CARE IF:  You develop a severe headache or confusion.  You have unusual weakness, numbness, or feel faint.  You have severe chest or abdominal pain.  You vomit repeatedly.  You have trouble breathing. MAKE SURE YOU:   Understand these instructions.  Will watch your condition.  Will get help right away if you are not doing well or get worse.   This information is not intended to replace advice given to you by your health care provider. Make sure you discuss any questions you have with your health care provider.   Document Released: 08/19/2005 Document Revised: 01/03/2015 Document Reviewed: 06/11/2013 Elsevier Interactive Patient Education 2016 Elsevier Inc[employer]  

## 2016-04-11 NOTE — Progress Notes (Signed)
Patient ID: Yvonne Sanders, female    DOB: 1960/11/30  Age: 55 y.o. MRN: [id number]    Subjective:  Subjective  HPI Yvonne Sanders presents for f/u bp and cholesterol.  Pt under a lot of stress because her father had a TIA and they are not getting rest themselves.  Review of Systems  Constitutional: Negative for appetite change, diaphoresis, fatigue and unexpected weight change.  Eyes: Negative for pain, redness and visual disturbance.  Respiratory: Negative for cough, chest tightness, shortness of breath and wheezing.   Cardiovascular: Negative for chest pain, palpitations and leg swelling.  Endocrine: Negative for cold intolerance, heat intolerance, polydipsia, polyphagia and polyuria.  Genitourinary: Negative for difficulty urinating, dysuria and frequency.  Neurological: Negative for dizziness, light-headedness, numbness and headaches.    History Past Medical History:  Diagnosis Date  . Abnormal pap 1989   cryo  . Arthritis   . Chicken pox   . Endometriosis   . 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 Glaucoma   . Headache(784.0)   . Hyperlipidemia   . Hypertension       . Migraine   . Positive TB test     She has a past surgical history that includes Back surgery (99); Cholecystectomy (88); Diagnostic laparoscopy (88); Appendectomy (75); Right oophorectomy (75); Eye surgery (12); Breast biopsy; Lumbar fusion (08/2011); and Lumbar fusion.   Her family history includes Breast cancer in her maternal aunt and maternal grandmother; Glaucoma in her father; Heart disease in her father; Hypertension in her father; Kidney disease in her father; Macular degeneration in her father; Prostate cancer in her father; Renal Disease in her father; Stroke in her father.She reports that she has never smoked. She has never used smokeless tobacco. She reports that she drinks alcohol. She reports that she does not use drugs.  Current Outpatient Prescriptions on File Prior to Visit    Medication Sig Dispense Refill  . aspirin 325 MG tablet Take 325 mg by mouth daily. Pt doesn't take when she takes Excedrin    . aspirin-acetaminophen-caffeine (EXCEDRIN MIGRAINE) 250-250-65 MG tablet Take 1 tablet by mouth every 6 (six) hours as needed for headache or migraine.    . Biotin 5000 MCG CAPS Take 5,000 mcg by mouth daily.      . Brinzolamide-Brimonidine (SIMBRINZA) 1-0.2 % SUSP Apply 1 drop to eye 2 (two) times daily.    . Calcium Carbonate Antacid (TUMS PO) Take 1 tablet by mouth daily as needed (heartburn).     . Carboxymethylcellul-Glycerin 0.5-0.9 % SOLN Apply 1 drop to eye daily as needed (eyes).     . Cholecalciferol (VITAMIN D-3) 5000 UNITS TABS Take 1 tablet by mouth 3 (three) times a week.     . esomeprazole (NEXIUM) 40 MG capsule Take 1 capsule (40 mg total) by mouth daily. 30 capsule 3  . folic acid (FOLVITE) Q000111Q MCG tablet Take 400 mcg by mouth daily.      Marland Kitchen ibuprofen (ADVIL,MOTRIN) 800 MG tablet Take 800 mg by mouth every 8 (eight) hours as needed. For pain     . Krill Oil 300 MG CAPS Take 1 capsule by mouth daily.     Marland Kitchen latanoprost (XALATAN) 0.005 % ophthalmic solution Apply 1 drop to eye daily.    . metoprolol succinate (TOPROL XL) 25 MG 24 hr tablet Take 1 tablet (25 mg total) by mouth daily. 30 tablet 11  . Multiple Vitamins-Minerals (MULTIVITAMINS THER. W/MINERALS) TABS Take 1 tablet by mouth daily.  No current facility-administered medications on file prior to visit.      Objective:  Objective  Physical Exam  Constitutional: She is oriented to person, place, and time. She appears well-developed and well-nourished.  HENT:  Head: Normocephalic and atraumatic.  Eyes: Conjunctivae and EOM are normal.  Neck: Normal range of motion. Neck supple. No JVD present. Carotid bruit is not present. No thyromegaly present.  Cardiovascular: Normal rate, regular rhythm and normal heart sounds.   No murmur heard. Pulmonary/Chest: Effort normal and breath sounds  normal. No respiratory distress. She has no wheezes. She has no rales. She exhibits no tenderness.  Musculoskeletal: She exhibits no edema.  Neurological: She is alert and oriented to person, place, and time.  Psychiatric: She has a normal mood and affect. Her behavior is normal.  Nursing note and vitals reviewed.  BP 122/88 (BP Location: Left Arm, Patient Position: Sitting, Cuff Size: Normal)   Pulse 81   Temp 98.3 F (36.8 C) (Oral)   Ht 5\' 5"  (1.651 m)   Wt 206 lb 6.4 oz (93.6 kg)   LMP 09/03/2007   SpO2 98%   BMI 34.35 kg/m  Wt Readings from Last 3 Encounters:  04/11/16 206 lb 6.4 oz (93.6 kg)  01/31/16 207 lb 12.8 oz (94.3 kg)  01/15/16 209 lb (94.8 kg)     Lab Results  Component Value Date   WBC 5.3 01/31/2016   HGB 12.2 01/31/2016   HCT 38.7 01/31/2016   PLT 211.0 01/31/2016   GLUCOSE 94 01/31/2016   CHOL 181 10/12/2015   TRIG 75.0 10/12/2015   HDL 61.90 10/12/2015   LDLCALC 104 (H) 10/12/2015   ALT 47 (H) 01/31/2016   AST 26 01/31/2016   NA 137 01/31/2016   K 3.7 01/31/2016   CL 104 01/31/2016   CREATININE 0.61 01/31/2016   BUN 6 01/31/2016   CO2 26 01/31/2016   TSH 0.53 10/12/2015    Dg Chest 2 View  Result Date: 01/15/2016 CLINICAL DATA:  Hacking cough. EXAM: CHEST  2 VIEW COMPARISON:  None. FINDINGS: Mediastinum and hilar structures normal. Heart size normal. Mild right base subsegmental atelectasis. Surgical clips right upper quadrant. No acute bony abnormality. Prior lumbar spine fusion . IMPRESSION: Mild right base subsegmental atelectasis, otherwise negative exam. Electronically Signed   By: Marcello Moores  Register   On: 01/15/2016 12:15     Assessment & Plan:  Plan  I have discontinued Ms. Janas Eszopiclone, spironolactone, cyclopentolate, prednisoLONE sodium phosphate, benzonatate, promethazine, and meloxicam. I am also having her maintain her multivitamins ther. w/minerals, folic acid, Biotin, ibuprofen, Calcium Carbonate Antacid (TUMS PO), aspirin,  Vitamin D-3, Brinzolamide-Brimonidine, Carboxymethylcellul-Glycerin, latanoprost, Krill Oil, esomeprazole, aspirin-acetaminophen-caffeine, metoprolol succinate, ezetimibe, rosuvastatin, cyclobenzaprine, and HYDROcodone-acetaminophen.  Meds ordered this encounter  Medications  . ezetimibe (ZETIA) 10 MG tablet    Sig: Take 1 tablet (10 mg total) by mouth daily.    Dispense:  90 tablet    Refill:  1  . rosuvastatin (CRESTOR) 40 MG tablet    Sig: Take 1 tablet (40 mg total) by mouth daily.    Dispense:  90 tablet    Refill:  1  . cyclobenzaprine (FLEXERIL) 10 MG tablet    Sig: take 1 tablet by mouth three times a day if needed for muscle spasm    Dispense:  30 tablet    Refill:  2  . HYDROcodone-acetaminophen (NORCO/VICODIN) 5-325 MG tablet    Sig: Take 1 tablet by mouth every 6 (six) hours as needed for moderate pain.  Dispense:  30 tablet    Refill:  0    Problem List Items Addressed This Visit      Unprioritized   Hyperlipidemia   Relevant Medications   ezetimibe (ZETIA) 10 MG tablet   rosuvastatin (CRESTOR) 40 MG tablet    Other Visit Diagnoses    Essential hypertension    -  Primary   Relevant Medications   ezetimibe (ZETIA) 10 MG tablet   rosuvastatin (CRESTOR) 40 MG tablet   Other Relevant Orders   Comprehensive metabolic panel   POCT urinalysis dipstick   Hyperlipidemia LDL goal <100       Relevant Medications   ezetimibe (ZETIA) 10 MG tablet   rosuvastatin (CRESTOR) 40 MG tablet   Other Relevant Orders   Lipid panel   Low back pain with sciatica, sciatica laterality unspecified, unspecified back pain laterality       Relevant Medications   cyclobenzaprine (FLEXERIL) 10 MG tablet   HYDROcodone-acetaminophen (NORCO/VICODIN) 5-325 MG tablet      Follow-up: Return in about 6 months (around 10/12/2016) for hypertension, hyperlipidemia.  Ann Held, DO

## 2016-04-11 NOTE — Assessment & Plan Note (Signed)
con't crestor and zetia Check labs

## 2016-05-03 ENCOUNTER — Other Ambulatory Visit: Payer: Self-pay | Admitting: Family Medicine

## 2016-05-03 DIAGNOSIS — K219 Gastro-esophageal reflux disease without esophagitis: Secondary | ICD-10-CM

## 2016-05-28 ENCOUNTER — Other Ambulatory Visit: Payer: Self-pay | Admitting: Obstetrics and Gynecology

## 2016-05-28 DIAGNOSIS — Z1231 Encounter for screening mammogram for malignant neoplasm of breast: Secondary | ICD-10-CM

## 2016-06-05 ENCOUNTER — Ambulatory Visit: Payer: PRIVATE HEALTH INSURANCE

## 2016-06-06 ENCOUNTER — Ambulatory Visit
Admission: RE | Admit: 2016-06-06 | Discharge: 2016-06-06 | Disposition: A | Discharge status: Home / Self Care | Payer: PRIVATE HEALTH INSURANCE | Source: Ambulatory Visit | Attending: Obstetrics and Gynecology | Admitting: Obstetrics and Gynecology

## 2016-06-06 DIAGNOSIS — Z1231 Encounter for screening mammogram for malignant neoplasm of breast: Secondary | ICD-10-CM

## 2016-07-03 ENCOUNTER — Ambulatory Visit (INDEPENDENT_AMBULATORY_CARE_PROVIDER_SITE_OTHER): Payer: PRIVATE HEALTH INSURANCE | Admitting: Obstetrics and Gynecology

## 2016-07-03 ENCOUNTER — Encounter: Payer: Self-pay | Admitting: Obstetrics and Gynecology

## 2016-07-03 VITALS — BP 140/82 | HR 70 | Resp 14 | Ht 64.5 in | Wt 207.6 lb

## 2016-07-03 DIAGNOSIS — Z01419 Encounter for gynecological examination (general) (routine) without abnormal findings: Secondary | ICD-10-CM | POA: Diagnosis not present

## 2016-07-03 DIAGNOSIS — Z0000 Encounter for general adult medical examination without abnormal findings: Secondary | ICD-10-CM

## 2016-07-03 LAB — POCT URINALYSIS DIPSTICK
Bilirubin, UA: NEGATIVE
GLUCOSE UA: NEGATIVE
Ketones, UA: NEGATIVE
Leukocytes, UA: NEGATIVE
Nitrite, UA: NEGATIVE
PH UA: 5
Protein, UA: NEGATIVE
RBC UA: NEGATIVE
UROBILINOGEN UA: NEGATIVE

## 2016-07-03 NOTE — Patient Instructions (Signed)

## 2016-07-03 NOTE — Progress Notes (Signed)
55 y.o. G47P1001 Married [place] female here for annual exam.    No bleeding or spotting.  No bladder control problems.   Father having medical problems and he lives with patient.  He had a stroke and has some residual deficits. Had a stroke and broke his hip last year. Brother in law died suddenly.   Really wants to start taking care of herself more.  Wants to loose weight.   PCP: Garnet Koyanagi, DO    Patient's last menstrual period was 09/03/2007.           Sexually active: Yes.   female The current method of family planning is post menopausal status.    Exercising: No.   Smoker:  no  Health Maintenance: Pap:  02-24-15 Neg:Neg HR HPV History of abnormal Pap:  Yes, Hx LGSIL 2011, 2012, 2013.  Pap ASCUS in 2014, and then normal in 2015 and 2016.  MMG:  06-11-16 Density B/Neg/BiRads1:[facility] Colonoscopy:  NEVER.  Will do with .  Has been referred in the past but had to cancel. BMD:   n/a  Result  n/a TDaP:  2016 Gardasil:   N/A Hep C:  Negative a couple of years ago. Flu vaccine already done.  Screening Labs:  Hb today: PCP, Urine today: Neg   reports that she has never smoked. She has never used smokeless tobacco. She reports that she does not drink alcohol or use drugs.  Past Medical History:  Diagnosis Date  . Abnormal pap 1989   cryo  . Arthritis   . Chicken pox   . Endometriosis   . 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 Glaucoma   . Headache(784.0)   . Hyperlipidemia   . Hypertension       . Migraine   . Positive TB test     Past Surgical History:  Procedure Laterality Date  . APPENDECTOMY  75  . BACK SURGERY  99   HNP  . BREAST BIOPSY     benign  . CHOLECYSTECTOMY  88  . DIAGNOSTIC LAPAROSCOPY  88   endometrious  . EYE SURGERY  12   laser for glaucoma  . LUMBAR FUSION  08/2011   l 3-4  . LUMBAR FUSION     l 4-5  . RIGHT OOPHORECTOMY  75    Current Outpatient Prescriptions  Medication Sig Dispense Refill  .  aspirin 325 MG tablet Take 325 mg by mouth daily. Pt doesn't take when she takes Excedrin    . aspirin-acetaminophen-caffeine (EXCEDRIN MIGRAINE) 250-250-65 MG tablet Take 1 tablet by mouth every 6 (six) hours as needed for headache or migraine.    . Biotin 5000 MCG CAPS Take 5,000 mcg by mouth daily.      . Brinzolamide-Brimonidine (SIMBRINZA) 1-0.2 % SUSP Place 1 drop into both eyes 2 (two) times daily.    . Calcium Carbonate Antacid (TUMS PO) Take 1 tablet by mouth daily as needed (heartburn).     . Carboxymethylcellul-Glycerin 0.5-0.9 % SOLN Apply 1 drop to eye daily as needed (eyes).     . Cholecalciferol (VITAMIN D3) 2000 units TABS Take 1 tablet by mouth daily.    . cyclobenzaprine (FLEXERIL) 10 MG tablet take 1 tablet by mouth three times a day if needed for muscle spasm 30 tablet 2  . esomeprazole (NEXIUM) 40 MG capsule take 1 capsule by mouth once daily 30 capsule 3  . ezetimibe (ZETIA) 10 MG tablet Take 1 tablet (10 mg total) by  mouth daily. 90 tablet 1  . folic acid (FOLVITE) Q000111Q MCG tablet Take 400 mcg by mouth daily.      Marland Kitchen HYDROcodone-acetaminophen (NORCO/VICODIN) 5-325 MG tablet Take 1 tablet by mouth every 6 (six) hours as needed for moderate pain. 30 tablet 0  . ibuprofen (ADVIL,MOTRIN) 800 MG tablet Take 800 mg by mouth every 8 (eight) hours as needed. For pain     . Krill Oil 300 MG CAPS Take 1 capsule by mouth daily.     Marland Kitchen latanoprost (XALATAN) 0.005 % ophthalmic solution Place 1 drop into both eyes daily.    . metoprolol succinate (TOPROL XL) 25 MG 24 hr tablet Take 1 tablet (25 mg total) by mouth daily. 30 tablet 11  . Multiple Vitamins-Minerals (MULTIVITAMINS THER. W/MINERALS) TABS Take 1 tablet by mouth daily.      . rosuvastatin (CRESTOR) 40 MG tablet Take 1 tablet (40 mg total) by mouth daily. 90 tablet 1   No current facility-administered medications for this visit.     Family History  Problem Relation Age of Onset  . Hypertension Father   . Glaucoma Father   .  Prostate cancer Father   . Renal Disease Father   . Macular degeneration Father   . Stroke Father   . Kidney disease Father   . Heart disease Father   . Breast cancer Maternal Grandmother   . Breast cancer Maternal Aunt     ROS:  Pertinent items are noted in HPI.  Otherwise, a comprehensive ROS was negative.  Exam:   BP 140/82 (BP Location: Right Arm, Patient Position: Sitting, Cuff Size: Large)   Pulse 70   Resp 14   Ht 5' 4.5" (1.638 m)   Wt 207 lb 9.6 oz (94.2 kg)   LMP 09/03/2007   BMI 35.08 kg/m     General appearance: alert, cooperative and appears stated age Head: Normocephalic, without obvious abnormality, atraumatic Neck: no adenopathy, supple, symmetrical, trachea midline and thyroid normal to inspection and palpation Lungs: clear to auscultation bilaterally Breasts: normal appearance, no masses or tenderness, No nipple retraction or dimpling, No nipple discharge or bleeding, No axillary or supraclavicular adenopathy Heart: regular rate and rhythm Abdomen: soft, non-tender; no masses, no organomegaly Extremities: extremities normal, atraumatic, no cyanosis or edema Skin: Skin color, texture, turgor normal. No rashes or lesions Lymph nodes: Cervical, supraclavicular, and axillary nodes normal. No abnormal inguinal nodes palpated Neurologic: Grossly normal  Pelvic: External genitalia:  no lesions              Urethra:  normal appearing urethra with no masses, tenderness or lesions              Bartholins and Skenes: normal                 Vagina: normal appearing vagina with normal color and discharge, no lesions              Cervix: no lesions              Pap taken: Yes.   Bimanual Exam:  Uterus:  normal size, contour, position, consistency, mobility, non-tender              Adnexa: no mass, fullness, tenderness              Rectal exam: No..  Confirms.              Anus:  normal sphincter tone, no lesions  Chaperone was present for exam.  Assessment:   Well  woman visit with normal exam. Hx LGSIL. Situational stress.   Plan: Yearly mammogram recommended after age 78.  Recommended self breast exam.  Pap and HR HPV as above. Discussed Calcium, Vitamin D, regular exercise program including cardiovascular and weight bearing exercise. Support given.  We discussed ways to reduce responsibilities at home and increase physical activity by doing things like gardening.  Follow up annually and prn.       After visit summary provided.

## 2016-07-05 LAB — IPS PAP TEST WITH HPV

## 2016-10-08 ENCOUNTER — Encounter: Payer: Self-pay | Admitting: Interventional Cardiology

## 2016-10-08 ENCOUNTER — Ambulatory Visit (INDEPENDENT_AMBULATORY_CARE_PROVIDER_SITE_OTHER): Payer: PRIVATE HEALTH INSURANCE | Admitting: Interventional Cardiology

## 2016-10-08 VITALS — BP 144/78 | HR 77 | Ht 65.0 in | Wt 210.0 lb

## 2016-10-08 DIAGNOSIS — I358 Other nonrheumatic aortic valve disorders: Secondary | ICD-10-CM

## 2016-10-08 DIAGNOSIS — E7849 Other hyperlipidemia: Secondary | ICD-10-CM

## 2016-10-08 DIAGNOSIS — I10 Essential (primary) hypertension: Secondary | ICD-10-CM | POA: Diagnosis not present

## 2016-10-08 DIAGNOSIS — I517 Cardiomegaly: Secondary | ICD-10-CM | POA: Diagnosis not present

## 2016-10-08 DIAGNOSIS — E784 Other hyperlipidemia: Secondary | ICD-10-CM

## 2016-10-08 MED ORDER — METOPROLOL SUCCINATE ER 50 MG PO TB24: 50.0000 mg | ORAL_TABLET | Freq: Every day | ORAL | 3 refills | Status: DC

## 2016-10-08 NOTE — Progress Notes (Signed)
Cardiology Office Note    Date:  10/08/2016   ID:  Yvonne Sanders, DOB [birth year], MRN [id number]  PCP:  Ann Held, DO  Cardiologist: Sinclair Grooms, MD   Chief Complaint  Patient presents with  . Follow-up    BP    History of Present Illness:  Yvonne Sanders is a 56 y.o. female who presents for aortic valve sclerosis/mild stenosis, hypertension, and LVH.  Yvonne Sanders is doing well. No medication side effects. She denies dyspnea, chest pain, and claudication.   Past Medical History:  Diagnosis Date  . Abnormal pap 1989   cryo  . Arthritis   . Chicken pox   . Endometriosis   . 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 Glaucoma   . Headache(784.0)   . Hyperlipidemia   . Hypertension       . Migraine   . Positive TB test     Past Surgical History:  Procedure Laterality Date  . APPENDECTOMY  75  . BACK SURGERY  99   HNP  . BREAST BIOPSY     benign  . CHOLECYSTECTOMY  88  . DIAGNOSTIC LAPAROSCOPY  88   endometrious  . EYE SURGERY  12   laser for glaucoma  . LUMBAR FUSION  08/2011   l 3-4  . LUMBAR FUSION     l 4-5  . RIGHT OOPHORECTOMY  75    Current Medications: Outpatient Medications Prior to Visit  Medication Sig Dispense Refill  . aspirin 325 MG tablet Take 325 mg by mouth daily. Pt doesn't take when she takes Excedrin    . aspirin-acetaminophen-caffeine (EXCEDRIN MIGRAINE) 250-250-65 MG tablet Take 1 tablet by mouth every 6 (six) hours as needed for headache or migraine.    . Biotin 5000 MCG CAPS Take 5,000 mcg by mouth daily.      . Brinzolamide-Brimonidine (SIMBRINZA) 1-0.2 % SUSP Place 1 drop into both eyes 2 (two) times daily.    . Calcium Carbonate Antacid (TUMS PO) Take 1 tablet by mouth daily as needed (heartburn).     . Carboxymethylcellul-Glycerin 0.5-0.9 % SOLN Apply 1 drop to eye daily as needed (eyes).     . Cholecalciferol (VITAMIN D3) 2000 units TABS Take 1 tablet by mouth daily.    . cyclobenzaprine (FLEXERIL) 10 MG  tablet take 1 tablet by mouth three times a day if needed for muscle spasm 30 tablet 2  . esomeprazole (NEXIUM) 40 MG capsule take 1 capsule by mouth once daily 30 capsule 3  . ezetimibe (ZETIA) 10 MG tablet Take 1 tablet (10 mg total) by mouth daily. 90 tablet 1  . folic acid (FOLVITE) Q000111Q MCG tablet Take 400 mcg by mouth daily.      Marland Kitchen HYDROcodone-acetaminophen (NORCO/VICODIN) 5-325 MG tablet Take 1 tablet by mouth every 6 (six) hours as needed for moderate pain. 30 tablet 0  . ibuprofen (ADVIL,MOTRIN) 800 MG tablet Take 800 mg by mouth every 8 (eight) hours as needed. For pain     . Krill Oil 300 MG CAPS Take 1 capsule by mouth daily.     Marland Kitchen latanoprost (XALATAN) 0.005 % ophthalmic solution Place 1 drop into both eyes daily.    . metoprolol succinate (TOPROL XL) 25 MG 24 hr tablet Take 1 tablet (25 mg total) by mouth daily. 30 tablet 11  . Multiple Vitamins-Minerals (MULTIVITAMINS THER. W/MINERALS) TABS Take 1 tablet by mouth daily.      . rosuvastatin (CRESTOR) 40  MG tablet Take 1 tablet (40 mg total) by mouth daily. 90 tablet 1   No facility-administered medications prior to visit.      Allergies:   Nubain [nalbuphine hcl]; Corticosteroids; Dilaudid [hydromorphone hcl]; Nubain [nalbuphine hcl]; Oxycodone-acetaminophen; Percodan [oxycodone-aspirin]; and Tomato   Social History   Social History  . Marital status: Married    Spouse name: N/A  . Number of children: N/A  . Years of education: N/A   Social History Main Topics  . Smoking status: Never Smoker  . Smokeless tobacco: Never Used  . Alcohol use No     Comment: occ. wine  . Drug use: No  . Sexual activity: Yes    Partners: Male    Birth control/ protection: Post-menopausal   Other Topics Concern  . None   Social History Narrative  . None     Family History:  The patient's family history includes Breast cancer in her maternal aunt and maternal grandmother; Glaucoma in her father; Heart disease in her father;  Hypertension in her father; Kidney disease in her father; Macular degeneration in her father; Prostate cancer in her father; Renal Disease in her father; Stroke in her father.   ROS:   Please see the history of present illness.    Chronic muscle pain and back discomfort, otherwise unremarkable.  All other systems reviewed and are negative.   PHYSICAL EXAM:   VS:  BP (!) 144/78   Pulse 77   Ht 5\' 5"  (1.651 m)   Wt 210 lb (95.3 kg)   LMP 09/03/2007   SpO2 98%   BMI 34.95 kg/m    GEN: Well nourished, well developed, in no acute distress  HEENT: normal  Neck: no JVD, carotid bruits, or masses Cardiac: RRR; 1 to 2/6 systolic murmur at the right upper sternal border. There are no rubs, or gallops,no edema . Respiratory:  clear to auscultation bilaterally, normal work of breathing GI: soft, nontender, nondistended, + BS MS: no deformity or atrophy  Skin: warm and dry, no rash Neuro:  Alert and Oriented x 3, Strength and sensation are intact Psych: euthymic mood, full affect  Wt Readings from Last 3 Encounters:  10/08/16 210 lb (95.3 kg)  07/03/16 207 lb 9.6 oz (94.2 kg)  04/11/16 206 lb 6.4 oz (93.6 kg)      Studies/Labs Reviewed:   EKG:  EKG  Normal sinus rhythm, left atrial abnormality, prominent voltage in lead 1 suggesting LVH. No change when compared to prior tracings.  Recent Labs: 10/12/2015: TSH 0.53 01/31/2016: Hemoglobin 12.2; Platelets 211.0 04/11/2016: ALT 23; BUN 9; Creatinine, Ser 0.65; Potassium 4.2; Sodium 141   Lipid Panel    Component Value Date/Time   CHOL 272 (H) 04/11/2016 0910   TRIG 75.0 04/11/2016 0910   HDL 63.60 04/11/2016 0910   CHOLHDL 4 04/11/2016 0910   VLDL 15.0 04/11/2016 0910   LDLCALC 194 (H) 04/11/2016 0910    Additional studies/ records that were reviewed today include:  Echocardiogram performed July 2015: Study Conclusions  - Left ventricle: The cavity size was normal. Wall thickness was increased in a pattern of mild LVH. There  was focal basal hypertrophy. Systolic function was vigorous. The estimated ejection fraction was in the range of 65% to 70%. Wall motion was normal; there were no regional wall motion abnormalities. Doppler parameters are consistent with abnormal left ventricular relaxation (grade 1 diastolic dysfunction). There was no evidence of elevated ventricular filling pressure by Doppler parameters. - Aortic valve: Trileaflet; mildly thickened, mildly  calcified leaflets. Mobility was not restricted. Transvalvular velocity was minimally increased. There was no stenosis. Mean gradient (S): 10 mm Hg. Peak gradient (S): 18 mm Hg. - Aortic root: The aortic root was normal in size. - Mitral valve: Structurally normal valve. - Right ventricle: Systolic function was normal. - Right atrium: The atrium was normal in size. - Tricuspid valve: There was no regurgitation. - Pericardium, extracardiac: There was no pericardial effusion.   ASSESSMENT:    1. Essential hypertension   2. LVH (left ventricular hypertrophy)   3. Other hyperlipidemia   4. Aortic valve sclerosis      PLAN:  In order of problems listed above:  1. 2 g sodium diet and weight loss were recommended. Aerobic exercise is recommended. Increase metoprolol succinate to 50 mg daily. 2. Noted on both EKG and echo (mild wall thickness increase in 2015) 3. Followed by primary care 4. Aortic sclerosis by echo in 2015. No change in quality of the murmur on today's exam.    Medication Adjustments/Labs and Tests Ordered: Current medicines are reviewed at length with the patient today.  Concerns regarding medicines are outlined above.  Medication changes, Labs and Tests ordered today are listed in the Patient Instructions below. There are no Patient Instructions on file for this visit.   Signed, Sinclair Grooms, MD  10/08/2016 8:21 AM    [facility] [place], [place], [place]  [address] Phone:  [phone]; Fax: [phone]

## 2016-10-08 NOTE — Patient Instructions (Signed)
Medication Instructions:  1) INCREASE Metoprolol Succinate to 50mg  once daily.  Labwork: None  Testing/Procedures: None  Follow-Up: Your physician wants you to follow-up in: 1 year with Dr. Tamala Julian. You will receive a reminder letter in the mail two months in advance. If you don't receive a letter, please call our office to schedule the follow-up appointment.   Any Other Special Instructions Will Be Listed Below (If Applicable). Check your blood pressure atleast 3 times throughout the year.  If it is running higher than 140/90, please let us know.    If you need a refill on your cardiac medications before your next appointment, please call your pharmacy.

## 2016-10-10 ENCOUNTER — Ambulatory Visit: Payer: PRIVATE HEALTH INSURANCE | Admitting: Family Medicine

## 2016-11-07 ENCOUNTER — Ambulatory Visit (INDEPENDENT_AMBULATORY_CARE_PROVIDER_SITE_OTHER): Payer: PRIVATE HEALTH INSURANCE | Admitting: Family Medicine

## 2016-11-07 ENCOUNTER — Encounter: Payer: Self-pay | Admitting: Family Medicine

## 2016-11-07 VITALS — BP 136/82 | HR 82 | Temp 98.1°F | Resp 16 | Ht 65.0 in | Wt 208.8 lb

## 2016-11-07 DIAGNOSIS — I10 Essential (primary) hypertension: Secondary | ICD-10-CM | POA: Diagnosis not present

## 2016-11-07 DIAGNOSIS — E785 Hyperlipidemia, unspecified: Secondary | ICD-10-CM | POA: Diagnosis not present

## 2016-11-07 MED ORDER — EZETIMIBE 10 MG PO TABS: 10.0000 mg | ORAL_TABLET | Freq: Every day | ORAL | 1 refills | Status: DC

## 2016-11-07 MED ORDER — ROSUVASTATIN CALCIUM 40 MG PO TABS
40.0000 mg | ORAL_TABLET | Freq: Every day | ORAL | 1 refills | Status: DC
Start: 2016-11-07 — End: 2017-12-19

## 2016-11-07 MED ORDER — HYDROCODONE-ACETAMINOPHEN 5-325 MG PO TABS: 1.0000 | ORAL_TABLET | Freq: Four times a day (QID) | ORAL | 0 refills | Status: DC | PRN

## 2016-11-07 MED ORDER — CYCLOBENZAPRINE HCL 10 MG PO TABS: ORAL_TABLET | ORAL | 2 refills | Status: DC

## 2016-11-07 NOTE — Progress Notes (Signed)
Patient ID: Yvonne Sanders, female   DOB: Oct 12, 1960, 56 y.o.   MRN: [id number]     Subjective:  I acted as a Education administrator for Dr. Carollee Herter.  Guerry Bruin, [place]   Patient ID: Yvonne Sanders, female    DOB: 14-Jan-1961, 56 y.o.   MRN: [id number]  Chief Complaint  Patient presents with  . Hyperlipidemia  . Hypertension  . Back Pain    Hyperlipidemia  This is a chronic problem. Pertinent negatives include no chest pain or shortness of breath. Current antihyperlipidemic treatment includes statins. There are no compliance problems.   Hypertension  This is a chronic problem. Pertinent negatives include no blurred vision, chest pain, headaches, malaise/fatigue, palpitations or shortness of breath.      Patient is in today for follow up blood pressure and cholesterol.  Has intermittent tingling in face around eyes.  No redness, flushing, or pain.  She thinks it may be caused by her eye drops.  Patient Care Team: Ann Held, DO as PCP - General (Family Medicine) Janne Lab, MD (Obstetrics and Gynecology) Verneita Griffes, MD (Ophthalmology) Belva Crome, MD as Consulting Physician (Cardiology) Hortencia Pilar, MD as Consulting Physician (Ophthalmology)   Past Medical History:  Diagnosis Date  . Abnormal pap 1989   cryo  . Arthritis   . Chicken pox   . Endometriosis   . 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 Glaucoma   . Headache(784.0)   . Hyperlipidemia   . Hypertension       . Migraine   . Positive TB test     Past Surgical History:  Procedure Laterality Date  . APPENDECTOMY  75  . BACK SURGERY  99   HNP  . BREAST BIOPSY     benign  . CHOLECYSTECTOMY  88  . DIAGNOSTIC LAPAROSCOPY  88   endometrious  . EYE SURGERY  12   laser for glaucoma  . LUMBAR FUSION  08/2011   l 3-4  . LUMBAR FUSION     l 4-5  . RIGHT OOPHORECTOMY  98    Family History  Problem Relation Age of Onset  . Hypertension Father   . Glaucoma Father   . Prostate cancer Father     . Renal Disease Father   . Macular degeneration Father   . Stroke Father   . Kidney disease Father   . Heart disease Father   . Breast cancer Maternal Grandmother   . Breast cancer Maternal Aunt     Social History   Social History  . Marital status: Married    Spouse name: N/A  . Number of children: N/A  . Years of education: N/A   Occupational History  . Not on file.   Social History Main Topics  . Smoking status: Never Smoker  . Smokeless tobacco: Never Used  . Alcohol use No     Comment: occ. wine  . Drug use: No  . Sexual activity: Yes    Partners: Male    Birth control/ protection: Post-menopausal   Other Topics Concern  . Not on file   Social History Narrative  . No narrative on file    Outpatient Medications Prior to Visit  Medication Sig Dispense Refill  . aspirin 325 MG tablet Take 325 mg by mouth daily. Pt doesn't take when she takes Excedrin    . aspirin-acetaminophen-caffeine (EXCEDRIN MIGRAINE) 250-250-65 MG tablet Take 1 tablet by mouth every 6 (six) hours as needed for headache  or migraine.    . Biotin 5000 MCG CAPS Take 5,000 mcg by mouth daily.      . Brinzolamide-Brimonidine (SIMBRINZA) 1-0.2 % SUSP Place 1 drop into both eyes 2 (two) times daily.    . Calcium Carbonate Antacid (TUMS PO) Take 1 tablet by mouth daily as needed (heartburn).     . Carboxymethylcellul-Glycerin 0.5-0.9 % SOLN Apply 1 drop to eye daily as needed (eyes).     . Cholecalciferol (VITAMIN D3) 2000 units TABS Take 1 tablet by mouth daily.    Marland Kitchen esomeprazole (NEXIUM) 40 MG capsule take 1 capsule by mouth once daily 30 capsule 3  . folic acid (FOLVITE) 010 MCG tablet Take 400 mcg by mouth daily.      Marland Kitchen ibuprofen (ADVIL,MOTRIN) 800 MG tablet Take 800 mg by mouth every 8 (eight) hours as needed. For pain     . Krill Oil 300 MG CAPS Take 1 capsule by mouth daily.     Marland Kitchen latanoprost (XALATAN) 0.005 % ophthalmic solution Place 1 drop into both eyes daily.    . metoprolol succinate  (TOPROL-XL) 50 MG 24 hr tablet Take 1 tablet (50 mg total) by mouth daily. Take with or immediately following a meal. 90 tablet 3  . Multiple Vitamins-Minerals (MULTIVITAMINS THER. W/MINERALS) TABS Take 1 tablet by mouth daily.      . cyclobenzaprine (FLEXERIL) 10 MG tablet take 1 tablet by mouth three times a day if needed for muscle spasm 30 tablet 2  . ezetimibe (ZETIA) 10 MG tablet Take 1 tablet (10 mg total) by mouth daily. 90 tablet 1  . HYDROcodone-acetaminophen (NORCO/VICODIN) 5-325 MG tablet Take 1 tablet by mouth every 6 (six) hours as needed for moderate pain. 30 tablet 0  . rosuvastatin (CRESTOR) 40 MG tablet Take 1 tablet (40 mg total) by mouth daily. 90 tablet 1   No facility-administered medications prior to visit.     Allergies  Allergen Reactions  . Nubain [Nalbuphine Hcl] Anaphylaxis  . Corticosteroids Other (See Comments)    Due to glaucoma can not have steroids  . Dilaudid [Hydromorphone Hcl] Nausea And Vomiting    Severe itching  . Nubain [Nalbuphine Hcl]   . Oxycodone-Acetaminophen Itching  . Percodan [Oxycodone-Aspirin] Hives and Itching  . Tomato Rash    Rash, spots on tongue    Review of Systems  Constitutional: Negative for fever and malaise/fatigue.  HENT: Negative for congestion.   Eyes: Negative for blurred vision.  Respiratory: Negative for cough and shortness of breath.   Cardiovascular: Negative for chest pain, palpitations and leg swelling.  Gastrointestinal: Negative for vomiting.  Musculoskeletal: Negative for back pain.  Skin: Negative for rash.  Neurological: Negative for loss of consciousness and headaches.       Objective:    Physical Exam  Constitutional: She is oriented to person, place, and time. She appears well-developed and well-nourished. No distress.  HENT:  Head: Normocephalic and atraumatic.  Eyes: Conjunctivae are normal.  Neck: Normal range of motion. No thyromegaly present.  Cardiovascular: Normal rate and regular  rhythm.   Pulmonary/Chest: Effort normal and breath sounds normal. She has no wheezes.  Abdominal: Soft. Bowel sounds are normal. There is no tenderness.  Musculoskeletal: Normal range of motion. She exhibits no edema or deformity.  Neurological: She is alert and oriented to person, place, and time.  Skin: Skin is warm and dry. She is not diaphoretic.  Psychiatric: She has a normal mood and affect.    BP 136/82 (BP Location: Left  Arm, Cuff Size: Normal)   Pulse 82   Temp 98.1 F (36.7 C) (Oral)   Resp 16   Ht 5\' 5"  (1.651 m)   Wt 208 lb 12.8 oz (94.7 kg)   LMP 09/03/2007   SpO2 98%   BMI 34.75 kg/m  Wt Readings from Last 3 Encounters:  11/07/16 208 lb 12.8 oz (94.7 kg)  10/08/16 210 lb (95.3 kg)  07/03/16 207 lb 9.6 oz (94.2 kg)     Lab Results  Component Value Date   WBC 5.3 01/31/2016   HGB 12.2 01/31/2016   HCT 38.7 01/31/2016   PLT 211.0 01/31/2016   GLUCOSE 89 11/07/2016   CHOL 220 (H) 11/07/2016   TRIG 72.0 11/07/2016   HDL 64.90 11/07/2016   LDLCALC 140 (H) 11/07/2016   ALT 71 (H) 11/07/2016   AST 48 (H) 11/07/2016   NA 141 11/07/2016   K 4.0 11/07/2016   CL 106 11/07/2016   CREATININE 0.70 11/07/2016   BUN 7 11/07/2016   CO2 28 11/07/2016   TSH 0.53 10/12/2015    Lab Results  Component Value Date   TSH 0.53 10/12/2015   Lab Results  Component Value Date   WBC 5.3 01/31/2016   HGB 12.2 01/31/2016   HCT 38.7 01/31/2016   MCV 84.3 01/31/2016   PLT 211.0 01/31/2016   Lab Results  Component Value Date   NA 141 11/07/2016   K 4.0 11/07/2016   CO2 28 11/07/2016   GLUCOSE 89 11/07/2016   BUN 7 11/07/2016   CREATININE 0.70 11/07/2016   BILITOT 0.4 11/07/2016   ALKPHOS 79 11/07/2016   AST 48 (H) 11/07/2016   ALT 71 (H) 11/07/2016   PROT 7.9 11/07/2016   ALBUMIN 4.4 11/07/2016   CALCIUM 9.9 11/07/2016   GFR 111.54 11/07/2016   Lab Results  Component Value Date   CHOL 220 (H) 11/07/2016   Lab Results  Component Value Date   HDL 64.90  11/07/2016   Lab Results  Component Value Date   LDLCALC 140 (H) 11/07/2016   Lab Results  Component Value Date   TRIG 72.0 11/07/2016   Lab Results  Component Value Date   CHOLHDL 3 11/07/2016   No results found for: HGBA1C     Assessment & Plan:   Problem List Items Addressed This Visit      Unprioritized   HTN (hypertension) - Primary   Relevant Medications   rosuvastatin (CRESTOR) 40 MG tablet   ezetimibe (ZETIA) 10 MG tablet   Other Relevant Orders   Comprehensive metabolic panel (Completed)   Hyperlipidemia   Relevant Medications   rosuvastatin (CRESTOR) 40 MG tablet   ezetimibe (ZETIA) 10 MG tablet   Other Relevant Orders   Lipid panel (Completed)      I am having Ms. Accomando maintain her multivitamins ther. w/minerals, folic acid, Biotin, ibuprofen, Calcium Carbonate Antacid (TUMS PO), aspirin, Carboxymethylcellul-Glycerin, Krill Oil, aspirin-acetaminophen-caffeine, esomeprazole, latanoprost, Brinzolamide-Brimonidine, Vitamin D3, metoprolol succinate, rosuvastatin, ezetimibe, cyclobenzaprine, and HYDROcodone-acetaminophen.  Meds ordered this encounter  Medications  . rosuvastatin (CRESTOR) 40 MG tablet    Sig: Take 1 tablet (40 mg total) by mouth daily.    Dispense:  90 tablet    Refill:  1  . ezetimibe (ZETIA) 10 MG tablet    Sig: Take 1 tablet (10 mg total) by mouth daily.    Dispense:  90 tablet    Refill:  1  . cyclobenzaprine (FLEXERIL) 10 MG tablet    Sig: take 1 tablet by  mouth three times a day if needed for muscle spasm    Dispense:  30 tablet    Refill:  2  . HYDROcodone-acetaminophen (NORCO/VICODIN) 5-325 MG tablet    Sig: Take 1 tablet by mouth every 6 (six) hours as needed for moderate pain.    Dispense:  30 tablet    Refill:  0    CMA served as scribe during this visit. History, Physical and Plan performed by medical provider. Documentation and orders reviewed and attested to.  Ann Held, DO

## 2016-11-07 NOTE — Patient Instructions (Signed)

## 2016-11-07 NOTE — Progress Notes (Signed)
Pre visit review using our clinic review tool, if applicable. No additional management support is needed unless otherwise documented below in the visit note. 

## 2016-11-08 ENCOUNTER — Encounter: Payer: Self-pay | Admitting: Family Medicine

## 2016-11-08 LAB — LIPID PANEL
CHOL/HDL RATIO: 3
Cholesterol: 220 mg/dL — ABNORMAL HIGH (ref 0–200)
HDL: 64.9 mg/dL (ref >39.00)
LDL Cholesterol: 140 mg/dL — ABNORMAL HIGH (ref 0–99)
NonHDL: 154.61
TRIGLYCERIDES: 72 mg/dL (ref 0.0–149.0)
VLDL: 14.4 mg/dL (ref 0.0–40.0)

## 2016-11-08 LAB — COMPREHENSIVE METABOLIC PANEL
ALT: 71 U/L — AB (ref 0–35)
AST: 48 U/L — ABNORMAL HIGH (ref 0–37)
Albumin: 4.4 g/dL (ref 3.5–5.2)
Alkaline Phosphatase: 79 U/L (ref 39–117)
BILIRUBIN TOTAL: 0.4 mg/dL (ref 0.2–1.2)
BUN: 7 mg/dL (ref 6–23)
CALCIUM: 9.9 mg/dL (ref 8.4–10.5)
CHLORIDE: 106 meq/L (ref 96–112)
CO2: 28 meq/L (ref 19–32)
Creatinine, Ser: 0.7 mg/dL (ref 0.40–1.20)
GFR: 111.54 mL/min (ref >60.00)
Glucose, Bld: 89 mg/dL (ref 70–99)
POTASSIUM: 4 meq/L (ref 3.5–5.1)
Sodium: 141 mEq/L (ref 135–145)
Total Protein: 7.9 g/dL (ref 6.0–8.3)

## 2016-11-11 ENCOUNTER — Encounter: Payer: Self-pay | Admitting: Family Medicine

## 2016-11-12 ENCOUNTER — Other Ambulatory Visit: Payer: Self-pay | Admitting: Family Medicine

## 2016-11-12 DIAGNOSIS — E785 Hyperlipidemia, unspecified: Secondary | ICD-10-CM

## 2016-11-12 NOTE — Telephone Encounter (Signed)
Per labs--- repeat 6 months

## 2017-05-15 ENCOUNTER — Encounter: Payer: Self-pay | Admitting: Family Medicine

## 2017-05-15 ENCOUNTER — Ambulatory Visit (INDEPENDENT_AMBULATORY_CARE_PROVIDER_SITE_OTHER): Payer: PRIVATE HEALTH INSURANCE | Admitting: Family Medicine

## 2017-05-15 VITALS — BP 114/82 | HR 69 | Temp 98.0°F | Ht 65.0 in | Wt 209.0 lb

## 2017-05-15 DIAGNOSIS — E785 Hyperlipidemia, unspecified: Secondary | ICD-10-CM

## 2017-05-15 DIAGNOSIS — M722 Plantar fascial fibromatosis: Secondary | ICD-10-CM | POA: Diagnosis not present

## 2017-05-15 DIAGNOSIS — G894 Chronic pain syndrome: Secondary | ICD-10-CM | POA: Diagnosis not present

## 2017-05-15 DIAGNOSIS — M25561 Pain in right knee: Secondary | ICD-10-CM | POA: Diagnosis not present

## 2017-05-15 DIAGNOSIS — I10 Essential (primary) hypertension: Secondary | ICD-10-CM

## 2017-05-15 LAB — COMPREHENSIVE METABOLIC PANEL
ALBUMIN: 4.4 g/dL (ref 3.5–5.2)
ALK PHOS: 71 U/L (ref 39–117)
ALT: 66 U/L — AB (ref 0–35)
AST: 41 U/L — ABNORMAL HIGH (ref 0–37)
BUN: 11 mg/dL (ref 6–23)
CO2: 27 mEq/L (ref 19–32)
Calcium: 9.9 mg/dL (ref 8.4–10.5)
Chloride: 105 mEq/L (ref 96–112)
Creatinine, Ser: 0.71 mg/dL (ref 0.40–1.20)
GFR: 109.52 mL/min (ref >60.00)
Glucose, Bld: 98 mg/dL (ref 70–99)
POTASSIUM: 4.1 meq/L (ref 3.5–5.1)
Sodium: 140 mEq/L (ref 135–145)
TOTAL PROTEIN: 7.6 g/dL (ref 6.0–8.3)
Total Bilirubin: 0.7 mg/dL (ref 0.2–1.2)

## 2017-05-15 LAB — LIPID PANEL
CHOLESTEROL: 179 mg/dL (ref 0–200)
HDL: 85.2 mg/dL (ref >39.00)
LDL Cholesterol: 76 mg/dL (ref 0–99)
NonHDL: 93.39
Total CHOL/HDL Ratio: 2
Triglycerides: 86 mg/dL (ref 0.0–149.0)
VLDL: 17.2 mg/dL (ref 0.0–40.0)

## 2017-05-15 MED ORDER — HYDROCODONE-ACETAMINOPHEN 5-325 MG PO TABS: 1.0000 | ORAL_TABLET | Freq: Four times a day (QID) | ORAL | 0 refills | Status: DC | PRN

## 2017-05-15 MED ORDER — CYCLOBENZAPRINE HCL 10 MG PO TABS: ORAL_TABLET | ORAL | 2 refills | Status: DC

## 2017-05-15 NOTE — Assessment & Plan Note (Signed)
Went over ice, stretching, inserts Massage/ reflexology Pt wants to hold off on referral for now

## 2017-05-15 NOTE — Assessment & Plan Note (Signed)
Improving on own She would like to hold off on referral

## 2017-05-15 NOTE — Assessment & Plan Note (Signed)
Tolerating statin, encouraged heart healthy diet, avoid trans fats, minimize simple carbs and saturated fats. Increase exercise as tolerated 

## 2017-05-15 NOTE — Progress Notes (Signed)
Patient ID: Yvonne Sanders, female    DOB: 01/07/1961  Age: 56 y.o. MRN: [id number]    Subjective:  Subjective  HPI Yvonne Sanders presents for f/u bp and cholesterol.  She has had a lot of ortho problems over the summer.  Problems with feet and R knee.  It has improved but knee still hurts with squatting and twisting. Her plantar fascitis is acting up in L foot again.    Review of Systems  Constitutional: Negative for chills and fever.  HENT: Negative for congestion and hearing loss.   Eyes: Negative for discharge.  Respiratory: Negative for cough and shortness of breath.   Cardiovascular: Negative for chest pain, palpitations and leg swelling.  Gastrointestinal: Negative for abdominal pain, blood in stool, constipation, diarrhea, nausea and vomiting.  Genitourinary: Negative for dysuria, frequency, hematuria and urgency.  Musculoskeletal: Positive for arthralgias and gait problem. Negative for back pain and myalgias.  Skin: Negative for rash.  Allergic/Immunologic: Negative for environmental allergies.  Neurological: Negative for dizziness, weakness and headaches.  Hematological: Does not bruise/bleed easily.  Psychiatric/Behavioral: Negative for suicidal ideas. The patient is not nervous/anxious.     History Past Medical History:  Diagnosis Date  . Abnormal pap 1989   cryo  . Arthritis   . Chicken pox   . Endometriosis   . 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 Glaucoma   . Headache(784.0)   . Hyperlipidemia   . Hypertension       . Migraine   . Positive TB test     She has a past surgical history that includes Back surgery (99); Cholecystectomy (88); Diagnostic laparoscopy (88); Appendectomy (75); Right oophorectomy (75); Eye surgery (12); Breast biopsy; Lumbar fusion (08/2011); and Lumbar fusion.   Her family history includes Breast cancer in her maternal aunt and maternal grandmother; Glaucoma in her father; Heart disease in her father; Hypertension in her  father; Kidney disease in her father; Macular degeneration in her father; Prostate cancer in her father; Renal Disease in her father; Stroke in her father.She reports that she has never smoked. She has never used smokeless tobacco. She reports that she does not drink alcohol or use drugs.  Current Outpatient Prescriptions on File Prior to Visit  Medication Sig Dispense Refill  . aspirin 325 MG tablet Take 325 mg by mouth daily. Pt doesn't take when she takes Excedrin    . aspirin-acetaminophen-caffeine (EXCEDRIN MIGRAINE) 250-250-65 MG tablet Take 1 tablet by mouth every 6 (six) hours as needed for headache or migraine.    . Biotin 5000 MCG CAPS Take 5,000 mcg by mouth daily.      . Brinzolamide-Brimonidine (SIMBRINZA) 1-0.2 % SUSP Place 1 drop into both eyes 2 (two) times daily.    . Calcium Carbonate Antacid (TUMS PO) Take 1 tablet by mouth daily as needed (heartburn).     . Carboxymethylcellul-Glycerin 0.5-0.9 % SOLN Apply 1 drop to eye daily as needed (eyes).     . Cholecalciferol (VITAMIN D3) 2000 units TABS Take 1 tablet by mouth daily.    Marland Kitchen esomeprazole (NEXIUM) 40 MG capsule take 1 capsule by mouth once daily 30 capsule 3  . ezetimibe (ZETIA) 10 MG tablet Take 1 tablet (10 mg total) by mouth daily. 90 tablet 1  . folic acid (FOLVITE) 092 MCG tablet Take 400 mcg by mouth daily.      Marland Kitchen ibuprofen (ADVIL,MOTRIN) 800 MG tablet Take 800 mg by mouth every 8 (eight) hours as needed. For pain     .  Krill Oil 300 MG CAPS Take 1 capsule by mouth daily.     Marland Kitchen latanoprost (XALATAN) 0.005 % ophthalmic solution Place 1 drop into both eyes daily.    . Multiple Vitamins-Minerals (MULTIVITAMINS THER. W/MINERALS) TABS Take 1 tablet by mouth daily.      . rosuvastatin (CRESTOR) 40 MG tablet Take 1 tablet (40 mg total) by mouth daily. 90 tablet 1  . metoprolol succinate (TOPROL-XL) 50 MG 24 hr tablet Take 1 tablet (50 mg total) by mouth daily. Take with or immediately following a meal. 90 tablet 3   No  current facility-administered medications on file prior to visit.      Objective:  Objective  Physical Exam  Constitutional: She is oriented to person, place, and time. She appears well-developed and well-nourished.  HENT:  Head: Normocephalic and atraumatic.  Eyes: Conjunctivae and EOM are normal.  Neck: Normal range of motion. Neck supple. No JVD present. Carotid bruit is not present. No thyromegaly present.  Cardiovascular: Normal rate, regular rhythm and normal heart sounds.   No murmur heard. Pulmonary/Chest: Effort normal and breath sounds normal. No respiratory distress. She has no wheezes. She has no rales. She exhibits no tenderness.  Musculoskeletal: She exhibits tenderness. She exhibits no edema.       Right knee: She exhibits normal range of motion and no swelling. Tenderness found.       Left foot: There is tenderness. There is no swelling.       Feet:  Neurological: She is alert and oriented to person, place, and time.  Psychiatric: She has a normal mood and affect.  Nursing note and vitals reviewed.  BP 114/82 (BP Location: Right Arm, Patient Position: Sitting, Cuff Size: Normal)   Pulse 69   Temp 98 F (36.7 C) (Oral)   Ht 5\' 5"  (1.651 m)   Wt 209 lb (94.8 kg)   LMP 09/03/2007   SpO2 98%   BMI 34.78 kg/m  Wt Readings from Last 3 Encounters:  05/15/17 209 lb (94.8 kg)  11/07/16 208 lb 12.8 oz (94.7 kg)  10/08/16 210 lb (95.3 kg)     Lab Results  Component Value Date   WBC 5.3 01/31/2016   HGB 12.2 01/31/2016   HCT 38.7 01/31/2016   PLT 211.0 01/31/2016   GLUCOSE 98 05/15/2017   CHOL 179 05/15/2017   TRIG 86.0 05/15/2017   HDL 85.20 05/15/2017   LDLCALC 76 05/15/2017   ALT 66 (H) 05/15/2017   AST 41 (H) 05/15/2017   NA 140 05/15/2017   K 4.1 05/15/2017   CL 105 05/15/2017   CREATININE 0.71 05/15/2017   BUN 11 05/15/2017   CO2 27 05/15/2017   TSH 0.53 10/12/2015    Mm Digital Screening Bilateral  Result Date: 06/06/2016 CLINICAL DATA:   Screening. EXAM: DIGITAL SCREENING BILATERAL MAMMOGRAM WITH CAD COMPARISON:  Previous exam(s). ACR Breast Density Category b: There are scattered areas of fibroglandular density. FINDINGS: There are no findings suspicious for malignancy. Images were processed with CAD. IMPRESSION: No mammographic evidence of malignancy. A result letter of this screening mammogram will be mailed directly to the patient. RECOMMENDATION: Screening mammogram in one year. (Code:SM-B-01Y) BI-RADS CATEGORY  1: Negative. Electronically Signed   By: Marin Olp M.D.   On: 06/11/2016 12:27     Assessment & Plan:  Plan  I am having Ms. Brooks maintain her multivitamins ther. w/minerals, folic acid, Biotin, ibuprofen, Calcium Carbonate Antacid (TUMS PO), aspirin, Carboxymethylcellul-Glycerin, Krill Oil, aspirin-acetaminophen-caffeine, esomeprazole, latanoprost, Brinzolamide-Brimonidine, Vitamin D3,  metoprolol succinate, rosuvastatin, ezetimibe, latanoprost, HYDROcodone-acetaminophen, and cyclobenzaprine.  Meds ordered this encounter  Medications  . latanoprost (XALATAN) 0.005 % ophthalmic solution    Sig: place 1 drop into both eyes at bedtime  . HYDROcodone-acetaminophen (NORCO/VICODIN) 5-325 MG tablet    Sig: Take 1 tablet by mouth every 6 (six) hours as needed for moderate pain.    Dispense:  30 tablet    Refill:  0  . cyclobenzaprine (FLEXERIL) 10 MG tablet    Sig: take 1 tablet by mouth three times a day if needed for muscle spasm    Dispense:  30 tablet    Refill:  2    Problem List Items Addressed This Visit      Unprioritized   HTN (hypertension)    Well controlled, no changes to meds. Encouraged heart healthy diet such as the DASH diet and exercise as tolerated.       Relevant Orders   Lipid panel (Completed)   Comprehensive metabolic panel (Completed)   Hyperlipidemia    Tolerating statin, encouraged heart healthy diet, avoid trans fats, minimize simple carbs and saturated fats. Increase exercise as  tolerated      Plantar fasciitis    Went over ice, stretching, inserts Massage/ reflexology Pt wants to hold off on referral for now       Right anterior knee pain    Improving on own She would like to hold off on referral        Other Visit Diagnoses    Hyperlipidemia LDL goal <100    -  Primary   Relevant Orders   Lipid panel (Completed)   Comprehensive metabolic panel (Completed)   Chronic pain syndrome       Relevant Medications   HYDROcodone-acetaminophen (NORCO/VICODIN) 5-325 MG tablet   cyclobenzaprine (FLEXERIL) 10 MG tablet      Follow-up: Return in about 6 months (around 11/12/2017) for hypertension, hyperlipidemia.  Ann Held, DO

## 2017-05-15 NOTE — Patient Instructions (Signed)

## 2017-05-15 NOTE — Assessment & Plan Note (Signed)
Well controlled, no changes to meds. Encouraged heart healthy diet such as the DASH diet and exercise as tolerated.  °

## 2017-07-04 ENCOUNTER — Ambulatory Visit (HOSPITAL_BASED_OUTPATIENT_CLINIC_OR_DEPARTMENT_OTHER)
Admission: RE | Admit: 2017-07-04 | Discharge: 2017-07-04 | Disposition: A | Discharge status: Home / Self Care | Payer: PRIVATE HEALTH INSURANCE | Source: Ambulatory Visit | Attending: Family Medicine | Admitting: Family Medicine

## 2017-07-04 ENCOUNTER — Encounter: Payer: Self-pay | Admitting: Family Medicine

## 2017-07-04 ENCOUNTER — Ambulatory Visit (INDEPENDENT_AMBULATORY_CARE_PROVIDER_SITE_OTHER): Payer: PRIVATE HEALTH INSURANCE | Admitting: Family Medicine

## 2017-07-04 VITALS — BP 188/98 | HR 73 | Temp 98.3°F | Ht 65.0 in | Wt 210.0 lb

## 2017-07-04 DIAGNOSIS — M25561 Pain in right knee: Secondary | ICD-10-CM | POA: Diagnosis not present

## 2017-07-04 MED ORDER — DICLOFENAC SODIUM 1 % TD GEL: 4.0000 g | Freq: Four times a day (QID) | TRANSDERMAL | 2 refills | Status: DC

## 2017-07-04 MED ORDER — KETOROLAC TROMETHAMINE 60 MG/2ML IM SOLN
60.0000 mg | Freq: Once | INTRAMUSCULAR | Status: AC
  Administered 2017-07-04: 60 mg via INTRAMUSCULAR

## 2017-07-04 NOTE — Progress Notes (Signed)
Patient ID: Yvonne Sanders, female    DOB: [birth year]  Age: 56 y.o. MRN: [id number]    Subjective:  Subjective  HPI JAZZMON PRINDLE presents for R knee pain-- it has worsened over the last few weeks.  No known injury.    Review of Systems  Constitutional: Negative for appetite change, diaphoresis, fatigue and unexpected weight change.  Eyes: Negative for pain, redness and visual disturbance.  Respiratory: Negative for cough, chest tightness, shortness of breath and wheezing.   Cardiovascular: Negative for chest pain, palpitations and leg swelling.  Endocrine: Negative for cold intolerance, heat intolerance, polydipsia, polyphagia and polyuria.  Genitourinary: Negative for difficulty urinating, dysuria and frequency.  Musculoskeletal: Positive for arthralgias and joint swelling.  Neurological: Negative for dizziness, light-headedness, numbness and headaches.    History Past Medical History:  Diagnosis Date  . Abnormal pap 1989   cryo  . Arthritis   . Chicken pox   . Endometriosis   . 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 Glaucoma   . Headache(784.0)   . Hyperlipidemia   . Hypertension       . Migraine   . Positive TB test     She has a past surgical history that includes Back surgery (99); Cholecystectomy (88); Diagnostic laparoscopy (88); Appendectomy (75); Right oophorectomy (75); Eye surgery (12); Breast biopsy; Lumbar fusion (08/2011); Lumbar fusion; and POSTERIOR LUMBAR FUSION 1 LEVEL (N/A, 08/29/2011).   Her family history includes Breast cancer in her maternal aunt and maternal grandmother; Glaucoma in her father; Heart disease in her father; Hypertension in her father; Kidney disease in her father; Macular degeneration in her father; Prostate cancer in her father; Renal Disease in her father; Stroke in her father.She reports that  has never smoked. she has never used smokeless tobacco. She reports that she does not drink alcohol or use drugs.  Current Outpatient  Medications on File Prior to Visit  Medication Sig Dispense Refill  . aspirin 325 MG tablet Take 325 mg by mouth daily. Pt doesn't take when she takes Excedrin    . aspirin-acetaminophen-caffeine (EXCEDRIN MIGRAINE) 250-250-65 MG tablet Take 1 tablet by mouth every 6 (six) hours as needed for headache or migraine.    . Biotin 5000 MCG CAPS Take 5,000 mcg by mouth daily.      . Brinzolamide-Brimonidine (SIMBRINZA) 1-0.2 % SUSP Place 1 drop into both eyes 2 (two) times daily.    . Calcium Carbonate Antacid (TUMS PO) Take 1 tablet by mouth daily as needed (heartburn).     . Carboxymethylcellul-Glycerin 0.5-0.9 % SOLN Apply 1 drop to eye daily as needed (eyes).     . Cholecalciferol (VITAMIN D3) 2000 units TABS Take 1 tablet by mouth daily.    . cyclobenzaprine (FLEXERIL) 10 MG tablet take 1 tablet by mouth three times a day if needed for muscle spasm 30 tablet 2  . esomeprazole (NEXIUM) 40 MG capsule take 1 capsule by mouth once daily 30 capsule 3  . ezetimibe (ZETIA) 10 MG tablet Take 1 tablet (10 mg total) by mouth daily. 90 tablet 1  . folic acid (FOLVITE) 132 MCG tablet Take 400 mcg by mouth daily.      Marland Kitchen HYDROcodone-acetaminophen (NORCO/VICODIN) 5-325 MG tablet Take 1 tablet by mouth every 6 (six) hours as needed for moderate pain. 30 tablet 0  . ibuprofen (ADVIL,MOTRIN) 800 MG tablet Take 800 mg by mouth every 8 (eight) hours as needed. For pain     . Krill Oil 300  MG CAPS Take 1 capsule by mouth daily.     Marland Kitchen latanoprost (XALATAN) 0.005 % ophthalmic solution Place 1 drop into both eyes daily.    Marland Kitchen latanoprost (XALATAN) 0.005 % ophthalmic solution place 1 drop into both eyes at bedtime    . Multiple Vitamins-Minerals (MULTIVITAMINS THER. W/MINERALS) TABS Take 1 tablet by mouth daily.      . rosuvastatin (CRESTOR) 40 MG tablet Take 1 tablet (40 mg total) by mouth daily. 90 tablet 1  . metoprolol succinate (TOPROL-XL) 50 MG 24 hr tablet Take 1 tablet (50 mg total) by mouth daily. Take with or  immediately following a meal. 90 tablet 3   No current facility-administered medications on file prior to visit.      Objective:  Objective  Physical Exam  Constitutional: She is oriented to person, place, and time. She appears well-developed and well-nourished.  HENT:  Head: Normocephalic and atraumatic.  Eyes: Conjunctivae and EOM are normal.  Neck: Normal range of motion. Neck supple. No JVD present. Carotid bruit is not present. No thyromegaly present.  Cardiovascular: Normal rate, regular rhythm and normal heart sounds.  No murmur heard. Pulmonary/Chest: Effort normal and breath sounds normal. No respiratory distress. She has no wheezes. She has no rales. She exhibits no tenderness.  Musculoskeletal: She exhibits edema and tenderness.       Right shoulder: She exhibits decreased range of motion, tenderness and swelling.       Right knee: She exhibits decreased range of motion and swelling. Tenderness found. Medial joint line, lateral joint line and patellar tendon tenderness noted.  Neurological: She is alert and oriented to person, place, and time.  Psychiatric: She has a normal mood and affect.  Nursing note and vitals reviewed.  BP (!) 188/98   Pulse 73   Temp 98.3 F (36.8 C) (Oral)   Ht 5\' 5"  (1.651 m)   Wt 210 lb (95.3 kg)   LMP 09/03/2007   SpO2 98%   BMI 34.95 kg/m  Wt Readings from Last 3 Encounters:  07/04/17 210 lb (95.3 kg)  05/15/17 209 lb (94.8 kg)  11/07/16 208 lb 12.8 oz (94.7 kg)     Lab Results  Component Value Date   WBC 5.3 01/31/2016   HGB 12.2 01/31/2016   HCT 38.7 01/31/2016   PLT 211.0 01/31/2016   GLUCOSE 98 05/15/2017   CHOL 179 05/15/2017   TRIG 86.0 05/15/2017   HDL 85.20 05/15/2017   LDLCALC 76 05/15/2017   ALT 66 (H) 05/15/2017   AST 41 (H) 05/15/2017   NA 140 05/15/2017   K 4.1 05/15/2017   CL 105 05/15/2017   CREATININE 0.71 05/15/2017   BUN 11 05/15/2017   CO2 27 05/15/2017   TSH 0.53 10/12/2015    Mm Digital  Screening Bilateral  Result Date: 06/06/2016 CLINICAL DATA:  Screening. EXAM: DIGITAL SCREENING BILATERAL MAMMOGRAM WITH CAD COMPARISON:  Previous exam(s). ACR Breast Density Category b: There are scattered areas of fibroglandular density. FINDINGS: There are no findings suspicious for malignancy. Images were processed with CAD. IMPRESSION: No mammographic evidence of malignancy. A result letter of this screening mammogram will be mailed directly to the patient. RECOMMENDATION: Screening mammogram in one year. (Code:SM-B-01Y) BI-RADS CATEGORY  1: Negative. Electronically Signed   By: Marin Olp M.D.   On: 06/11/2016 12:27     Assessment & Plan:  Plan  I am having Una B. Kocurek start on diclofenac sodium. I am also having her maintain her multivitamins ther. w/minerals, folic  acid, Biotin, ibuprofen, Calcium Carbonate Antacid (TUMS PO), aspirin, carboxymethylcellul-glycerin, Krill Oil, aspirin-acetaminophen-caffeine, esomeprazole, latanoprost, Brinzolamide-Brimonidine, Vitamin D3, metoprolol succinate, rosuvastatin, ezetimibe, latanoprost, HYDROcodone-acetaminophen, and cyclobenzaprine. We administered ketorolac.  Meds ordered this encounter  Medications  . diclofenac sodium (VOLTAREN) 1 % GEL    Sig: Apply 4 g topically 4 (four) times daily.    Dispense:  100 g    Refill:  2  . ketorolac (TORADOL) injection 60 mg    Problem List Items Addressed This Visit    None    Visit Diagnoses    Acute pain of right knee    -  Primary   Relevant Medications   diclofenac sodium (VOLTAREN) 1 % GEL   ketorolac (TORADOL) injection 60 mg (Completed)   Other Relevant Orders   DG Knee Complete 4 Views Right   Ambulatory referral to Orthopedic Surgery      Follow-up: Return if symptoms worsen or fail to improve.  Ann Held, DO

## 2017-07-04 NOTE — Patient Instructions (Signed)

## 2017-07-07 ENCOUNTER — Encounter: Payer: Self-pay | Admitting: Family Medicine

## 2017-08-06 NOTE — Progress Notes (Signed)
56 y.o. G66P1001 Married [place] American female here for annual exam.    Right knee pain.  Injury with playing ball with foster grandchild.  Having an MRI tomorrow and saw ortho.   Father with health issues.  Lives with patient.  Stress due to caregiver issues.  Sister in law cares for him.   PCP does labs.  All ok per patient.   Had flu vaccine.   PCP: Garnet Koyanagi, MD   Patient's last menstrual period was 09/03/2007 (approximate).           Sexually active: Yes.    The current method of family planning is post menopausal status.    Exercising: No.   Smoker:  no  Health Maintenance: Pap: 02-24-15 Neg:Neg HR HPV.  07/03/16 Neg:neg HR HPV.  History of abnormal Pap:  Yes, Hx LGSIL 2011, 2012, 2013.  Pap ASCUS in 2014, and then normal in 2015 and 2016.  MMG: 06-06-16 Density B/Neg/BiRads1:TBC Colonoscopy:  NEVER.  Discussing also through PCP.  BMD:   n/a  Result  n/a TDaP:  2016 Gardasil:   no HIV:25 years ago Neg Hep C: Neg 2015 Screening Labs:  Hb today: PCP, Urine today: not done   reports that  has never smoked. she has never used smokeless tobacco. She reports that she does not drink alcohol or use drugs.  Past Medical History:  Diagnosis Date  . Abnormal pap 1989   cryo  . Arthritis   . Chicken pox   . Endometriosis   . 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 Glaucoma   . Headache(784.0)   . Hyperlipidemia   . Hypertension       . Migraine   . Positive TB test     Past Surgical History:  Procedure Laterality Date  . APPENDECTOMY  75  . BACK SURGERY  99   HNP  . BREAST BIOPSY     benign  . CHOLECYSTECTOMY  88  . DIAGNOSTIC LAPAROSCOPY  88   endometrious  . EYE SURGERY  12   laser for glaucoma  . LUMBAR FUSION  08/2011   l 3-4  . LUMBAR FUSION     l 4-5  . RIGHT OOPHORECTOMY  75    Current Outpatient Medications  Medication Sig Dispense Refill  . aspirin 325 MG tablet Take 325 mg by mouth daily. Pt doesn't take when she takes Excedrin     . aspirin-acetaminophen-caffeine (EXCEDRIN MIGRAINE) 250-250-65 MG tablet Take 1 tablet by mouth every 6 (six) hours as needed for headache or migraine.    . Biotin 5000 MCG CAPS Take 5,000 mcg by mouth daily.      . Brinzolamide-Brimonidine (SIMBRINZA) 1-0.2 % SUSP Place 1 drop into both eyes 2 (two) times daily.    . Calcium Carbonate Antacid (TUMS PO) Take 1 tablet by mouth daily as needed (heartburn).     . Carboxymethylcellul-Glycerin 0.5-0.9 % SOLN Apply 1 drop to eye daily as needed (eyes).     . Cholecalciferol (VITAMIN D3) 2000 units TABS Take 1 tablet by mouth daily.    . cyclobenzaprine (FLEXERIL) 10 MG tablet take 1 tablet by mouth three times a day if needed for muscle spasm 30 tablet 2  . diclofenac sodium (VOLTAREN) 1 % GEL Apply 4 g topically 4 (four) times daily. 100 g 2  . esomeprazole (NEXIUM) 40 MG capsule take 1 capsule by mouth once daily 30 capsule 3  . ezetimibe (ZETIA) 10 MG tablet Take 1 tablet (10  mg total) by mouth daily. 90 tablet 1  . folic acid (FOLVITE) 308 MCG tablet Take 400 mcg by mouth daily.      Marland Kitchen HYDROcodone-acetaminophen (NORCO/VICODIN) 5-325 MG tablet Take 1 tablet by mouth every 6 (six) hours as needed for moderate pain. 30 tablet 0  . ibuprofen (ADVIL,MOTRIN) 800 MG tablet Take 800 mg by mouth every 8 (eight) hours as needed. For pain     . Krill Oil 300 MG CAPS Take 1 capsule by mouth daily.     Marland Kitchen latanoprost (XALATAN) 0.005 % ophthalmic solution Place 1 drop into both eyes daily.    Marland Kitchen loteprednol (LOTEMAX) 0.2 % SUSP Place 1 drop into both eyes 4 (four) times daily.    . Multiple Vitamins-Minerals (MULTIVITAMINS THER. W/MINERALS) TABS Take 1 tablet by mouth daily.      . rosuvastatin (CRESTOR) 40 MG tablet Take 1 tablet (40 mg total) by mouth daily. 90 tablet 1  . metoprolol succinate (TOPROL-XL) 50 MG 24 hr tablet Take 1 tablet (50 mg total) by mouth daily. Take with or immediately following a meal. 90 tablet 3   No current facility-administered  medications for this visit.     Family History  Problem Relation Age of Onset  . Hypertension Father   . Glaucoma Father   . Prostate cancer Father   . Renal Disease Father   . Macular degeneration Father   . Stroke Father   . Kidney disease Father   . Heart disease Father   . Breast cancer Maternal Grandmother   . Breast cancer Maternal Aunt     ROS:  Pertinent items are noted in HPI.  Otherwise, a comprehensive ROS was negative.  Exam:   BP 120/66 (BP Location: Right Arm, Patient Position: Sitting, Cuff Size: Large)   Pulse 70   Resp 14   Ht 5' 5.5" (1.664 m)   Wt 208 lb (94.3 kg)   LMP 09/03/2007 (Approximate)   BMI 34.09 kg/m     General appearance: alert, cooperative and appears stated age Head: Normocephalic, without obvious abnormality, atraumatic Neck: no adenopathy, supple, symmetrical, trachea midline and thyroid normal to inspection and palpation Lungs: clear to auscultation bilaterally Breasts: normal appearance, no masses or tenderness, No nipple retraction or dimpling, No nipple discharge or bleeding, No axillary or supraclavicular adenopathy Heart: regular rate and rhythm Abdomen: soft, non-tender; no masses, no organomegaly Extremities: extremities normal, atraumatic, no cyanosis or edema Skin: Skin color, texture, turgor normal. No rashes or lesions Lymph nodes: Cervical, supraclavicular, and axillary nodes normal. No abnormal inguinal nodes palpated Neurologic: Grossly normal  Pelvic: External genitalia:  no lesions              Urethra:  normal appearing urethra with no masses, tenderness or lesions              Bartholins and Skenes: normal                 Vagina: normal appearing vagina with normal color and discharge, no lesions              Cervix: no lesions              Pap taken: Yes.   Bimanual Exam:  Uterus:  normal size, contour, position, consistency, mobility, non-tender              Adnexa: no mass, fullness, tenderness               Rectal exam: Yes.  Marland Kitchen  Confirms.              Anus:  normal sphincter tone, no lesions  Chaperone was present for exam.  Assessment:   Well woman visit with normal exam. Hx LGSIL.   Plan: Mammogram screening discussed. Recommended self breast awareness. Pap and HR HPV as above. Guidelines for Calcium, Vitamin D, regular exercise program including cardiovascular and weight bearing exercise. Colonoscopy, IFOB, or Cologuard recommended.  Patient will pursue colonoscopy in 2019 through her PCP. Labs with PCP.  I encouraged her to take time for herself.  Follow up annually and prn.    After visit summary provided.

## 2017-08-07 ENCOUNTER — Other Ambulatory Visit: Payer: Self-pay

## 2017-08-07 ENCOUNTER — Encounter: Payer: Self-pay | Admitting: Obstetrics and Gynecology

## 2017-08-07 ENCOUNTER — Ambulatory Visit: Payer: PRIVATE HEALTH INSURANCE | Admitting: Obstetrics and Gynecology

## 2017-08-07 ENCOUNTER — Other Ambulatory Visit (HOSPITAL_COMMUNITY)
Admission: RE | Admit: 2017-08-07 | Discharge: 2017-08-07 | Disposition: A | Discharge status: Home / Self Care | Payer: PRIVATE HEALTH INSURANCE | Source: Ambulatory Visit | Attending: Obstetrics and Gynecology | Admitting: Obstetrics and Gynecology

## 2017-08-07 ENCOUNTER — Ambulatory Visit: Payer: PRIVATE HEALTH INSURANCE

## 2017-08-07 ENCOUNTER — Other Ambulatory Visit: Payer: Self-pay | Admitting: Obstetrics and Gynecology

## 2017-08-07 VITALS — BP 120/66 | HR 70 | Resp 14 | Ht 65.5 in | Wt 208.0 lb

## 2017-08-07 DIAGNOSIS — Z1231 Encounter for screening mammogram for malignant neoplasm of breast: Secondary | ICD-10-CM

## 2017-08-07 DIAGNOSIS — Z01419 Encounter for gynecological examination (general) (routine) without abnormal findings: Secondary | ICD-10-CM

## 2017-08-07 NOTE — Patient Instructions (Signed)

## 2017-08-08 LAB — CYTOLOGY - PAP
ADEQUACY: ABSENT
Diagnosis: NEGATIVE
HPV: NOT DETECTED

## 2017-09-02 HISTORY — PX: GLAUCOMA SURGERY: SHX656

## 2017-09-11 ENCOUNTER — Ambulatory Visit: Payer: PRIVATE HEALTH INSURANCE

## 2017-10-23 ENCOUNTER — Encounter: Payer: Self-pay | Admitting: Interventional Cardiology

## 2017-10-27 ENCOUNTER — Telehealth: Payer: Self-pay | Admitting: Interventional Cardiology

## 2017-10-27 MED ORDER — METOPROLOL SUCCINATE ER 50 MG PO TB24: 50.0000 mg | ORAL_TABLET | Freq: Every day | ORAL | 0 refills | Status: DC

## 2017-10-27 NOTE — Telephone Encounter (Signed)
New Message    *STAT* If patient is at the pharmacy, call can be transferred to refill team.   1. Which medications need to be refilled? (please list name of each medication and dose if known) metoprolol succinate (TOPROL-XL) 50 MG 24 hr tablet (Expired)   2. Which pharmacy/location (including street and city if local pharmacy) is medication to be sent to? [facility]    3. Do they need a 30 day or 90 day supply? [place]

## 2017-10-27 NOTE — Telephone Encounter (Signed)
Pt's medication was sent to pt's pharmacy as requested. Confirmation received.  °

## 2017-10-30 ENCOUNTER — Ambulatory Visit: Payer: PRIVATE HEALTH INSURANCE | Admitting: Interventional Cardiology

## 2017-11-02 IMAGING — DX DG CHEST 2V
2 series · 2 of 2 positions shown · non-contrast
Comparison: None.

CLINICAL DATA: Hacking cough.

EXAM:
CHEST  2 VIEW

[chest pa]
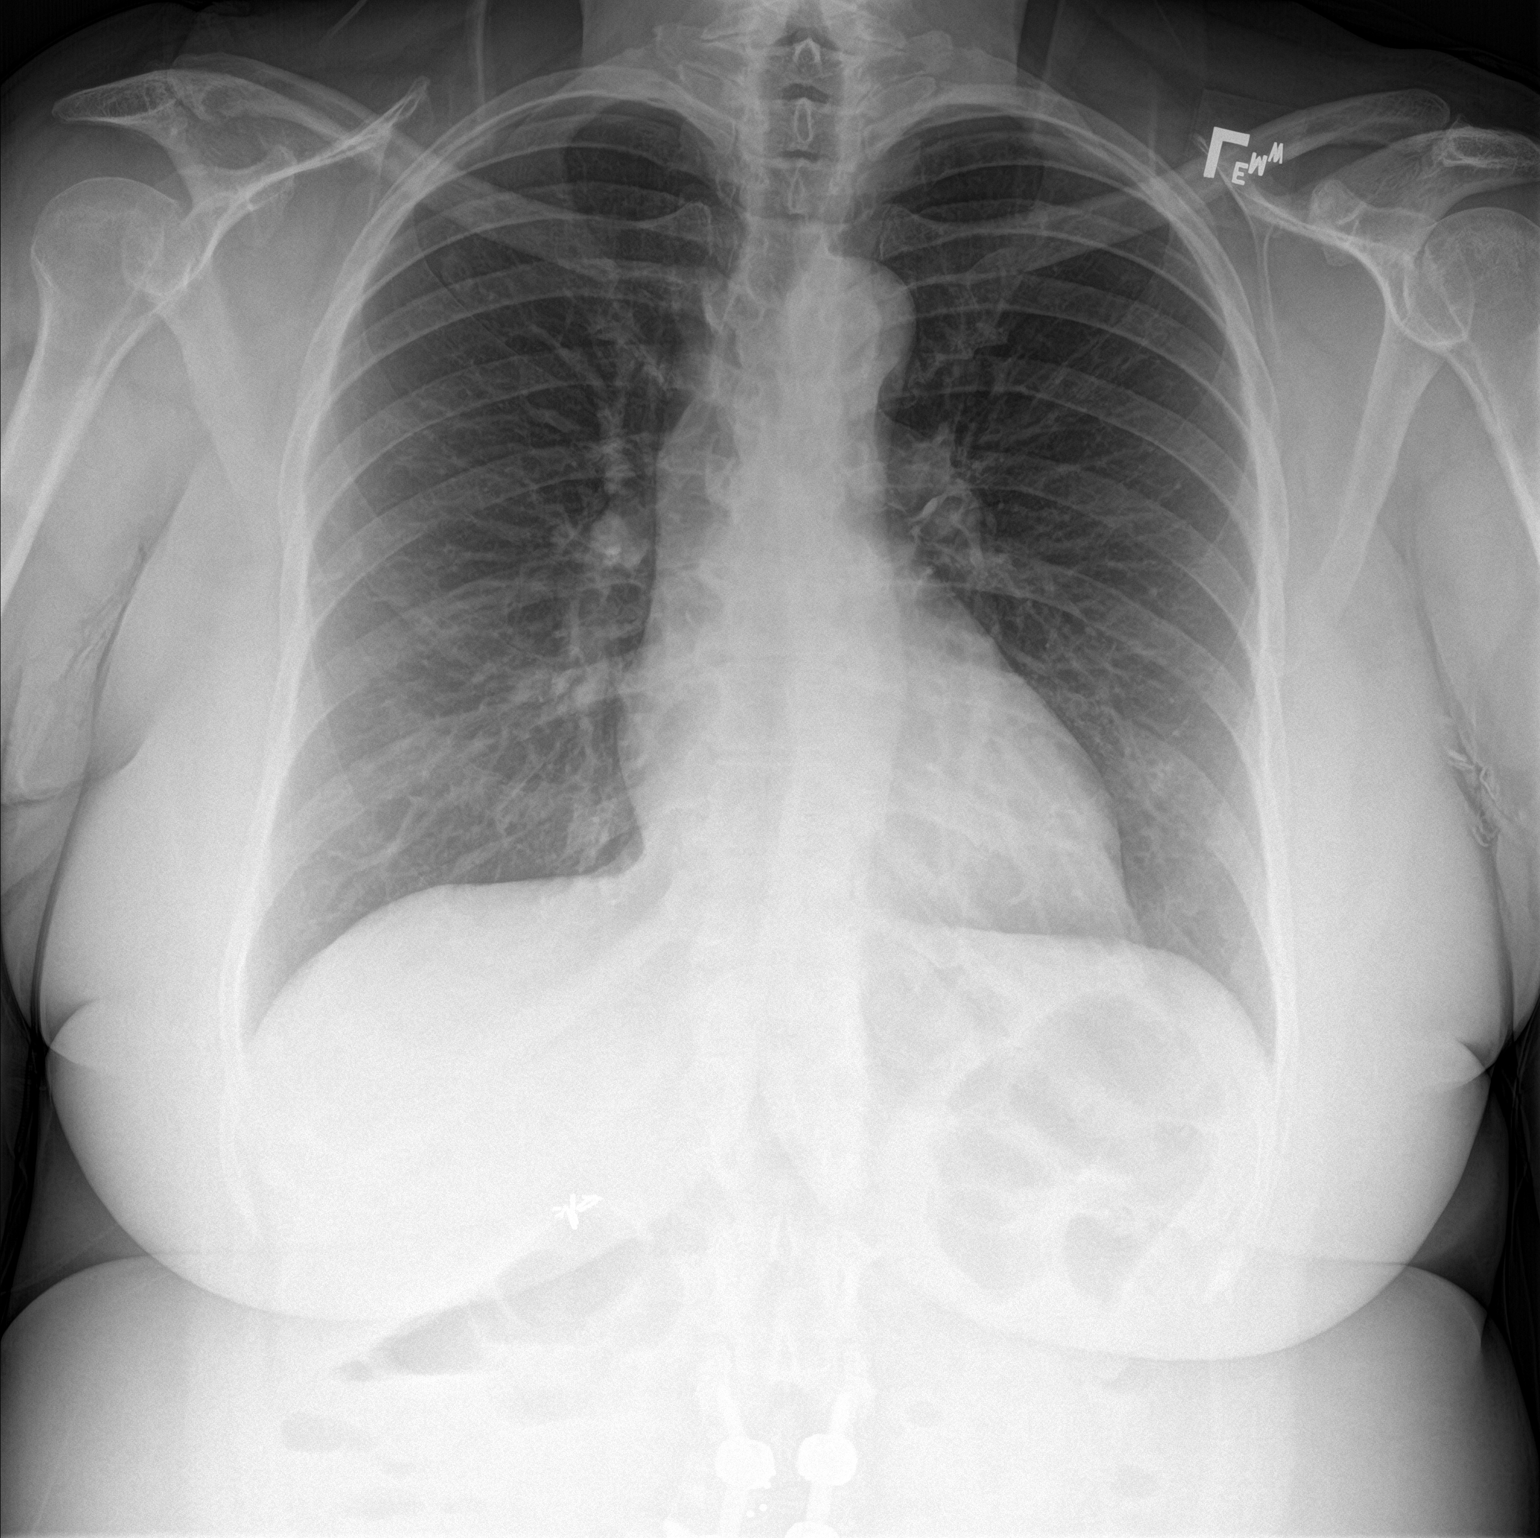

[chest lat]
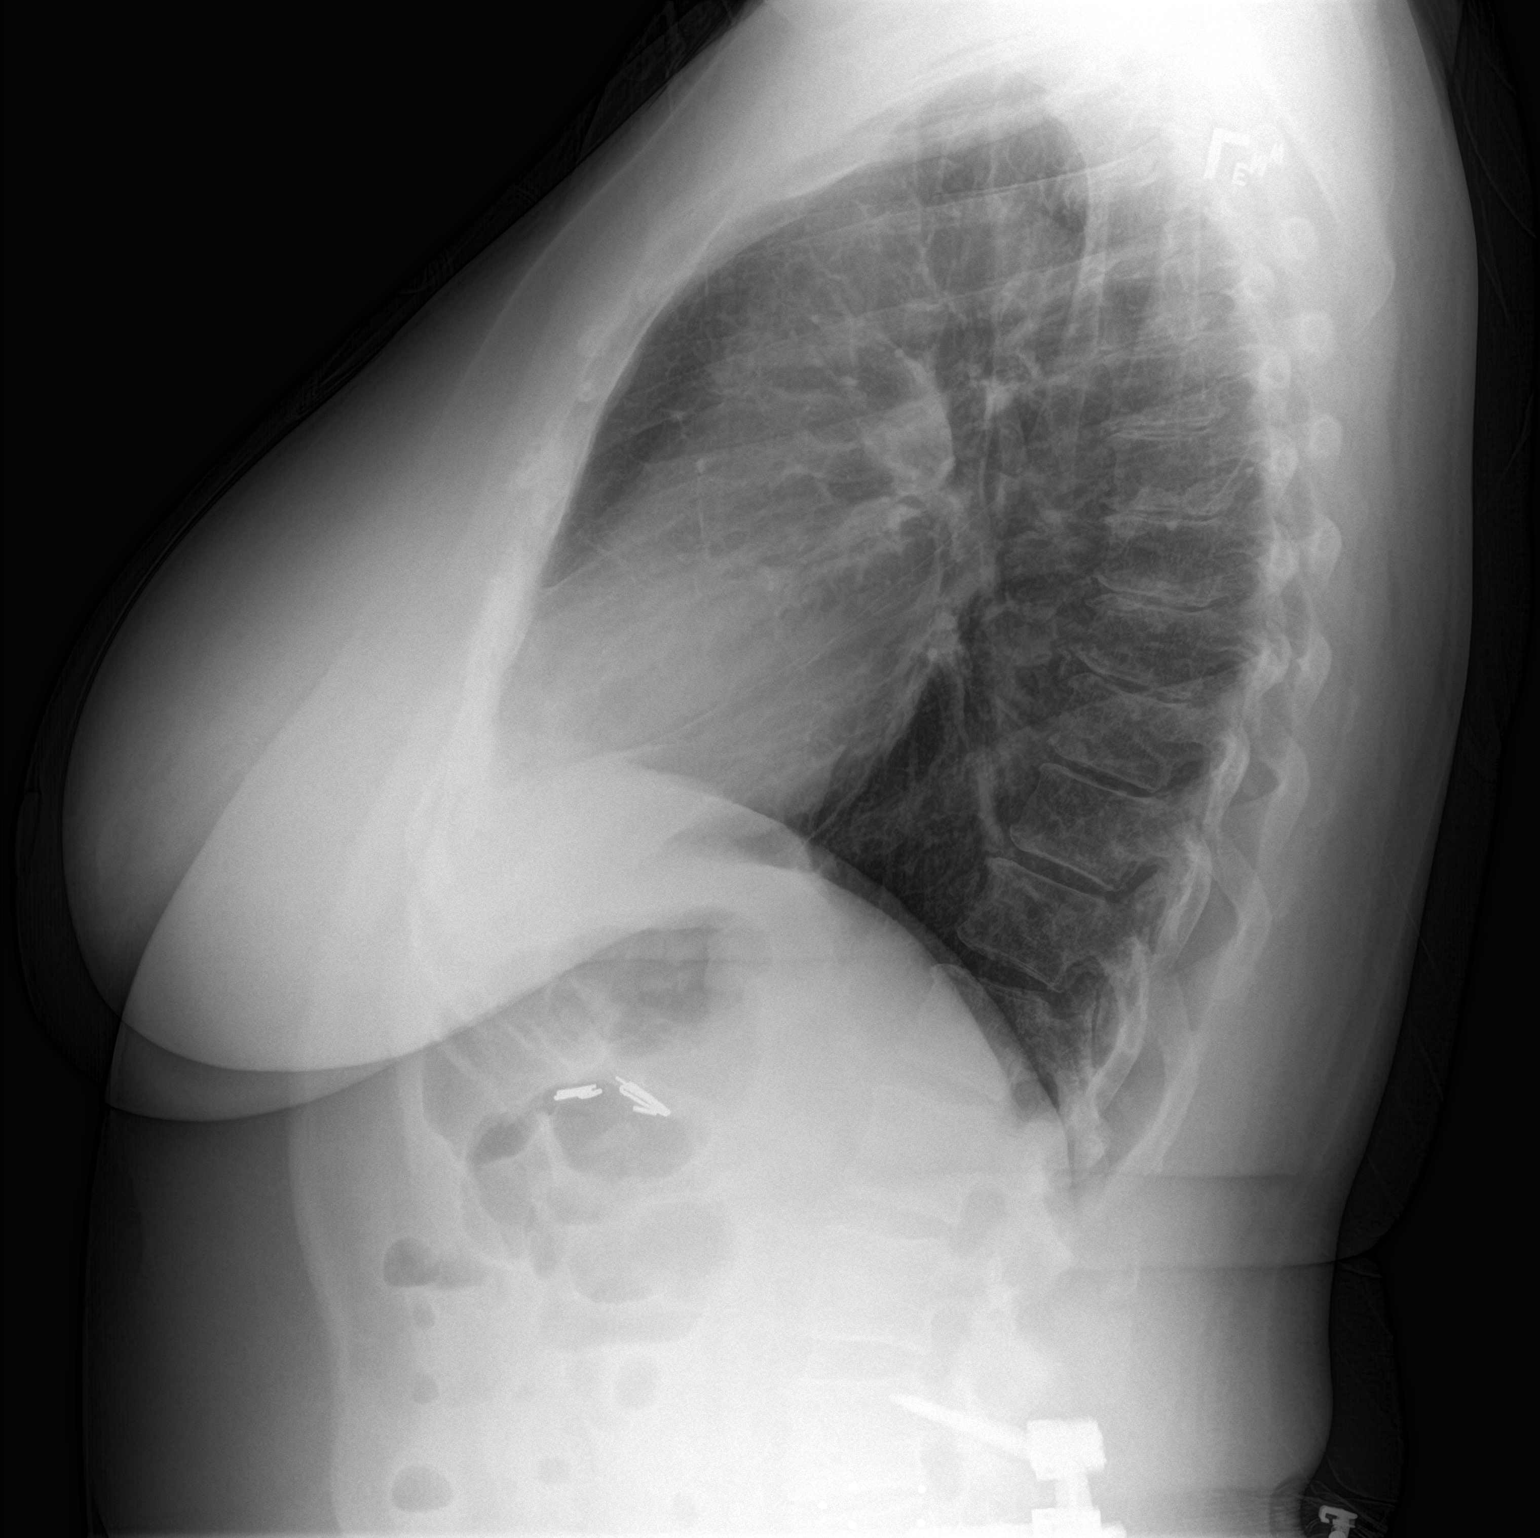

[2 of 2 positions shown; findings below may reference images not displayed]

FINDINGS: Mediastinum and hilar structures normal. Heart size normal. Mild
right base subsegmental atelectasis. Surgical clips right upper
quadrant. No acute bony abnormality. Prior lumbar spine fusion .
IMPRESSION: Mild right base subsegmental atelectasis, otherwise negative exam.

## 2017-11-11 ENCOUNTER — Ambulatory Visit: Payer: PRIVATE HEALTH INSURANCE | Admitting: Interventional Cardiology

## 2017-11-13 ENCOUNTER — Ambulatory Visit: Payer: PRIVATE HEALTH INSURANCE | Admitting: Family Medicine

## 2017-12-19 ENCOUNTER — Other Ambulatory Visit: Payer: Self-pay | Admitting: Family Medicine

## 2017-12-19 DIAGNOSIS — E785 Hyperlipidemia, unspecified: Secondary | ICD-10-CM

## 2017-12-26 ENCOUNTER — Encounter: Payer: Self-pay | Admitting: Family Medicine

## 2017-12-26 ENCOUNTER — Ambulatory Visit: Payer: PRIVATE HEALTH INSURANCE | Admitting: Family Medicine

## 2017-12-26 ENCOUNTER — Other Ambulatory Visit: Payer: Self-pay

## 2017-12-26 VITALS — BP 160/90 | HR 97 | Temp 99.1°F | Ht 66.5 in | Wt 210.6 lb

## 2017-12-26 DIAGNOSIS — H6121 Impacted cerumen, right ear: Secondary | ICD-10-CM

## 2017-12-26 DIAGNOSIS — J069 Acute upper respiratory infection, unspecified: Secondary | ICD-10-CM

## 2017-12-26 DIAGNOSIS — I10 Essential (primary) hypertension: Secondary | ICD-10-CM | POA: Diagnosis not present

## 2017-12-26 DIAGNOSIS — J029 Acute pharyngitis, unspecified: Secondary | ICD-10-CM

## 2017-12-26 LAB — POCT RAPID STREP A (OFFICE): Rapid Strep A Screen: NEGATIVE

## 2017-12-26 MED ORDER — BENZONATATE 100 MG PO CAPS: 100.0000 mg | ORAL_CAPSULE | Freq: Three times a day (TID) | ORAL | 0 refills | Status: DC | PRN

## 2017-12-26 MED ORDER — AZELASTINE HCL 0.1 % NA SOLN: 1.0000 | Freq: Two times a day (BID) | NASAL | 0 refills | Status: DC

## 2017-12-26 NOTE — Patient Instructions (Addendum)
1. Stop mucinex. I am prescribing tessalon pearles for cough.   IF you received an x-ray today, you will receive an invoice from [facility]. Please contact [facility] at [phone] with questions or concerns regarding your invoice.   IF you received labwork today, you will receive an invoice from [place] [facility]. Please contact [facility] at [phone] with questions or concerns regarding your invoice.   Our billing staff will not be able to assist you with questions regarding bills from these companies.  You will be contacted with the lab results as soon as they are available. The fastest way to get your results is to activate your My Chart account. Instructions are located on the last page of this paperwork. If you have not heard from [place] regarding the results in 2 weeks, please contact this office.     Upper Respiratory Infection, Adult Most upper respiratory infections (URIs) are caused by a virus. A URI affects the nose, throat, and upper air passages. The most common type of URI is often called "the common cold." Follow these instructions at home:  Take medicines only as told by your doctor.  Gargle warm saltwater or take cough drops to comfort your throat as told by your doctor.  Use a warm mist humidifier or inhale steam from a shower to increase air moisture. This may make it easier to breathe.  Drink enough fluid to keep your pee (urine) clear or pale yellow.  Eat soups and other clear broths.  Have a healthy diet.  Rest as needed.  Go back to work when your fever is gone or your doctor says it is okay. ? You may need to stay home longer to avoid giving your URI to others. ? You can also wear a face mask and wash your hands often to prevent spread of the virus.  Use your inhaler more if you have asthma.  Do not use any tobacco products, including cigarettes, chewing tobacco, or electronic cigarettes. If you need help quitting, ask your doctor. Contact a  doctor if:  You are getting worse, not better.  Your symptoms are not helped by medicine.  You have chills.  You are getting more short of breath.  You have brown or red mucus.  You have yellow or brown discharge from your nose.  You have pain in your face, especially when you bend forward.  You have a fever.  You have puffy (swollen) neck glands.  You have pain while swallowing.  You have white areas in the back of your throat. Get help right away if:  You have very bad or constant: ? Headache. ? Ear pain. ? Pain in your forehead, behind your eyes, and over your cheekbones (sinus pain). ? Chest pain.  You have long-lasting (chronic) lung disease and any of the following: ? Wheezing. ? Long-lasting cough. ? Coughing up blood. ? A change in your usual mucus.  You have a stiff neck.  You have changes in your: ? Vision. ? Hearing. ? Thinking. ? Mood. This information is not intended to replace advice given to you by your health care provider. Make sure you discuss any questions you have with your health care provider. Document Released: 02/05/2008 Document Revised: 04/21/2016 Document Reviewed: 11/24/2013 Elsevier Interactive Patient Education  2018 [employer].

## 2017-12-26 NOTE — Progress Notes (Signed)
4/26/20198:25 AM  Yvonne Sanders [birth year], 57 y.o. female [id number]  Chief Complaint  Patient presents with  . Laryngitis    Having sore throat, coughing with fever. Exposed to strep with sister. Haws been w/o voice since yesterday    HPI:   Patient is a 57 y.o. female with past medical history significant for HTN and HLP who presents today for 3 days of nasal congestion, bilateral ear pain, sore throat, cough and fever 101 for 2 days. She also became hoarse yesterday. She has been exposed to multiple family members with strep. Last night throat felt swollen enough that she only had ice chips. She denies any breathing or swallowing problems, drooling, or pain with opening her mouth. She denies any nausea, vomiting and diarrhea. Has been taking tylenol and mucinex with sudafed.   Scheduled with PCP for CPE in 2 weeks Sees cards every June   Depression screen [facility] 2/9 12/26/2017 07/04/2017 01/12/2016  Decreased Interest 0 0 0  Down, Depressed, Hopeless 0 0 0  PHQ - 2 Score 0 0 0    Allergies  Allergen Reactions  . Nubain [Nalbuphine Hcl] Anaphylaxis  . Corticosteroids Other (See Comments)    Due to glaucoma can not have steroids  . Dilaudid [Hydromorphone Hcl] Nausea And Vomiting    Severe itching  . Nubain [Nalbuphine Hcl]   . Oxycodone-Acetaminophen Itching  . Percodan [Oxycodone-Aspirin] Hives and Itching  . Tomato Rash    Rash, spots on tongue    Prior to Admission medications   Medication Sig Start Date End Date Taking? Authorizing Provider  aspirin 325 MG tablet Take 325 mg by mouth daily. Pt doesn't take when she takes Excedrin   Yes [provider]  aspirin-acetaminophen-caffeine (EXCEDRIN MIGRAINE) [phone] MG tablet Take 1 tablet by mouth every 6 (six) hours as needed for headache or migraine.   Yes [provider]  Biotin 5000 MCG CAPS Take 5,000 mcg by mouth daily.     Yes [provider]  Brinzolamide-Brimonidine (SIMBRINZA)  1-0.2 % SUSP Place 1 drop into both eyes 2 (two) times daily. 05/15/16  Yes [provider]  Calcium Carbonate Antacid (TUMS PO) Take 1 tablet by mouth daily as needed (heartburn).    Yes [provider]  Carboxymethylcellul-Glycerin 0.5-0.9 % SOLN Apply 1 drop to eye daily as needed (eyes).    Yes [provider]  Cholecalciferol (VITAMIN D3) 2000 units TABS Take 1 tablet by mouth daily.   Yes [provider]  cyclobenzaprine (FLEXERIL) 10 MG tablet take 1 tablet by mouth three times a day if needed for muscle spasm 05/15/17  Yes Roma Schanz R, DO  diclofenac sodium (VOLTAREN) 1 % GEL Apply 4 g topically 4 (four) times daily. 07/04/17  Yes Ann Held, DO  esomeprazole (NEXIUM) 40 MG capsule take 1 capsule by mouth once daily 05/03/16  Yes Carollee Herter, Kendrick Fries R, DO  ezetimibe (ZETIA) 10 MG tablet TAKE 1 TABLET BY MOUTH EVERY DAY 12/22/17  Yes Roma Schanz R, DO  ezetimibe (ZETIA) 10 MG tablet TAKE 1 TABLET BY MOUTH EVERY DAY 12/22/17  Yes Ann Held, DO  folic acid (FOLVITE) 182 MCG tablet Take 400 mcg by mouth daily.     Yes [provider]  HYDROcodone-acetaminophen (NORCO/VICODIN) 5-325 MG tablet Take 1 tablet by mouth every 6 (six) hours as needed for moderate pain. 05/15/17  Yes Roma Schanz R, DO  ibuprofen (ADVIL,MOTRIN) 800 MG tablet Take 800 mg  by mouth every 8 (eight) hours as needed. For pain    Yes [provider]  Javier Docker Oil 300 MG CAPS Take 1 capsule by mouth daily.    Yes [provider]  latanoprost (XALATAN) 0.005 % ophthalmic solution Place 1 drop into both eyes daily. 05/15/16  Yes [provider]  metoprolol succinate (TOPROL-XL) 50 MG 24 hr tablet Take 1 tablet (50 mg total) by mouth daily. Take with or immediately following a meal. Please keep upcoming appt. Thank you 10/27/17  Yes Belva Crome, MD  Multiple Vitamins-Minerals (MULTIVITAMINS THER. W/MINERALS) TABS Take 1  tablet by mouth daily.     Yes [provider]  rosuvastatin (CRESTOR) 40 MG tablet TAKE 1 TABLET BY MOUTH EVERY DAY 12/22/17  Yes Carollee Herter, Kendrick Fries R, DO  rosuvastatin (CRESTOR) 40 MG tablet TAKE 1 TABLET BY MOUTH EVERY DAY 12/22/17  Yes Roma Schanz R, DO  loteprednol (LOTEMAX) 0.2 % SUSP Place 1 drop into both eyes 4 (four) times daily.    [provider]    Past Medical History:  Diagnosis Date  . Abnormal pap 1989   cryo  . Arthritis   . Chicken pox   . Endometriosis   . 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 Glaucoma   . Headache(784.0)   . Hyperlipidemia   . Hypertension       . Migraine   . Positive TB test     Past Surgical History:  Procedure Laterality Date  . APPENDECTOMY  75  . BACK SURGERY  99   HNP  . BREAST BIOPSY     benign  . CHOLECYSTECTOMY  88  . DIAGNOSTIC LAPAROSCOPY  88   endometrious  . EYE SURGERY  12   laser for glaucoma  . LUMBAR FUSION  08/2011   l 3-4  . LUMBAR FUSION     l 4-5  . RIGHT OOPHORECTOMY  50    Social History   Tobacco Use  . Smoking status: Never Smoker  . Smokeless tobacco: Never Used  Substance Use Topics  . Alcohol use: No    Comment: occ. wine    Family History  Problem Relation Age of Onset  . Hypertension Father   . Glaucoma Father   . Prostate cancer Father   . Renal Disease Father   . Macular degeneration Father   . Stroke Father   . Kidney disease Father   . Heart disease Father   . Breast cancer Maternal Grandmother   . Breast cancer Maternal Aunt     ROS Per hpi  OBJECTIVE:  Blood pressure (!) 160/90, pulse 97, temperature 99.1 F (37.3 C), temperature source Oral, height 5' 6.5" (1.689 m), weight 210 lb 9.6 oz (95.5 kg), last menstrual period 09/03/2007, SpO2 99 %.  BP Readings from Last 3 Encounters:  12/26/17 (!) 160/90  08/07/17 120/66  07/04/17 (!) 188/98    Physical Exam  Constitutional: She is oriented to person, place, and time. She appears  well-developed and well-nourished.  HENT:  Head: Normocephalic and atraumatic.  Right Ear: Hearing, external ear and ear canal normal.  Left Ear: Hearing, tympanic membrane, external ear and ear canal normal.  Nose: Right sinus exhibits no maxillary sinus tenderness and no frontal sinus tenderness. Left sinus exhibits no maxillary sinus tenderness and no frontal sinus tenderness.  Mouth/Throat: Mucous membranes are normal. Posterior oropharyngeal erythema present. No oropharyngeal exudate. Tonsils are 0 on the right. Tonsils are 0 on the  left.  RIGHT TM not visualized due to impacted cerumen. Successful cleaned with lavage. TM normal.  Eyes: Pupils are equal, round, and reactive to light. EOM are normal.  Neck: Neck supple.  Cardiovascular: Normal rate and regular rhythm. Exam reveals no gallop and no friction rub.  Murmur heard.  Systolic murmur is present with a grade of 2/6. Pulmonary/Chest: Effort normal and breath sounds normal. She has no wheezes. She has no rales.  Lymphadenopathy:    She has no cervical adenopathy.  Neurological: She is alert and oriented to person, place, and time.  Skin: Skin is warm and dry.  Nursing note and vitals reviewed.   Results for orders placed or performed in visit on 12/26/17 (from the past 24 hour(s))  POCT rapid strep A     Status: None   Collection Time: 12/26/17  8:27 AM  Result Value Ref Range   Rapid Strep A Screen Negative Negative     ASSESSMENT and PLAN  1. Viral upper respiratory illness Discussed supportive measures for URI: increase hydration, rest, OTC medications, etc. RTC precautions discussed.  2. Sore throat - POCT rapid strep A  3. Impacted cerumen of right ear - Ear wax removal  4. Essential hypertension Discussed no sudafed, advised phenylephrine instead.   Other orders - benzonatate (TESSALON) 100 MG capsule; Take 1-2 capsules (100-200 mg total) by mouth 3 (three) times daily as needed for cough. - azelastine  (ASTELIN) 0.1% nasal spray, 1 spray per nostril twice a day   Return if symptoms worsen or fail to improve.    Rutherford Guys, MD [facility] [place], [place] [address] Ph.  [phone] Fax [phone]

## 2017-12-29 ENCOUNTER — Encounter: Payer: Self-pay | Admitting: Family Medicine

## 2017-12-29 ENCOUNTER — Ambulatory Visit: Payer: PRIVATE HEALTH INSURANCE | Admitting: Family Medicine

## 2017-12-29 VITALS — BP 139/82 | HR 83 | Temp 98.8°F | Resp 16 | Ht 66.5 in | Wt 212.8 lb

## 2017-12-29 DIAGNOSIS — J40 Bronchitis, not specified as acute or chronic: Secondary | ICD-10-CM

## 2017-12-29 DIAGNOSIS — J324 Chronic pansinusitis: Secondary | ICD-10-CM

## 2017-12-29 MED ORDER — HYDROCOD POLST-CPM POLST ER 10-8 MG/5ML PO SUER: 5.0000 mL | Freq: Every evening | ORAL | Status: DC | PRN

## 2017-12-29 MED ORDER — AZITHROMYCIN 250 MG PO TABS: ORAL_TABLET | ORAL | 1 refills | Status: DC

## 2017-12-29 MED ORDER — HYDROCOD POLST-CPM POLST ER 10-8 MG/5ML PO SUER: 5.0000 mL | Freq: Every evening | ORAL | 0 refills | Status: DC | PRN

## 2017-12-29 NOTE — Progress Notes (Signed)
Patient ID: Yvonne Sanders, female   DOB: [birth year], 57 y.o.   MRN: [id number]     Subjective:  I acted as a Education administrator for Dr. Carollee Herter.  Guerry Bruin, [place]   Patient ID: Yvonne Sanders, female    DOB: [birth year], 57 y.o.   MRN: [id number]  Chief Complaint  Patient presents with  . follow up viral illness    HPI  Patient is in today for follow up viral illness.  She was seen on 12/26/17.  Her body aches are now worse and earache in both ears has worsen.  She now has drainage in her left eye.  Her mucus that she coughs up is now thick and yellow. Pt taking otc and she saw [facility] and was given tessalon perles   Patient Care Team: Carollee Herter, Alferd Apa, DO as PCP - General (Family Medicine) Janne Lab, MD (Obstetrics and Gynecology) Verneita Griffes, MD (Ophthalmology) Belva Crome, MD as Consulting Physician (Cardiology) Hortencia Pilar, MD as Consulting Physician (Ophthalmology)   Past Medical History:  Diagnosis Date  . Abnormal pap 1989   cryo  . Arthritis   . Chicken pox   . Endometriosis   . 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 Glaucoma   . Headache(784.0)   . Hyperlipidemia   . Hypertension       . Migraine   . Positive TB test     Past Surgical History:  Procedure Laterality Date  . APPENDECTOMY  75  . BACK SURGERY  99   HNP  . BREAST BIOPSY     benign  . CHOLECYSTECTOMY  88  . DIAGNOSTIC LAPAROSCOPY  88   endometrious  . EYE SURGERY  12   laser for glaucoma  . LUMBAR FUSION  08/2011   l 3-4  . LUMBAR FUSION     l 4-5  . RIGHT OOPHORECTOMY  18    Family History  Problem Relation Age of Onset  . Hypertension Father   . Glaucoma Father   . Prostate cancer Father   . Renal Disease Father   . Macular degeneration Father   . Stroke Father   . Kidney disease Father   . Heart disease Father   . Breast cancer Maternal Grandmother   . Breast cancer Maternal Aunt     Social History   Socioeconomic History  . Marital status: Married   Spouse name: Not on file  . Number of children: Not on file  . Years of education: Not on file  . Highest education level: Not on file  Occupational History  . Not on file  Social Needs  . Financial resource strain: Not on file  . Food insecurity:    Worry: Not on file    Inability: Not on file  . Transportation needs:    Medical: Not on file    Non-medical: Not on file  Tobacco Use  . Smoking status: Never Smoker  . Smokeless tobacco: Never Used  Substance and Sexual Activity  . Alcohol use: No    Comment: occ. wine  . Drug use: No  . Sexual activity: Yes    Partners: Male    Birth control/protection: Post-menopausal  Lifestyle  . Physical activity:    Days per week: Not on file    Minutes per session: Not on file  . Stress: Not on file  Relationships  . Social connections:    Talks on phone: Not on file    Gets together:  Not on file    Attends religious service: Not on file    Active member of club or organization: Not on file    Attends meetings of clubs or organizations: Not on file    Relationship status: Not on file  . Intimate partner violence:    Fear of current or ex partner: Not on file    Emotionally abused: Not on file    Physically abused: Not on file    Forced sexual activity: Not on file  Other Topics Concern  . Not on file  Social History Narrative  . Not on file    Outpatient Medications Prior to Visit  Medication Sig Dispense Refill  . aspirin 325 MG tablet Take 325 mg by mouth daily. Pt doesn't take when she takes Excedrin    . aspirin-acetaminophen-caffeine (EXCEDRIN MIGRAINE) 250-250-65 MG tablet Take 1 tablet by mouth every 6 (six) hours as needed for headache or migraine.    Marland Kitchen azelastine (ASTELIN) 0.1 % nasal spray Place 1 spray into both nostrils 2 (two) times daily. Use in each nostril as directed 30 mL 0  . benzonatate (TESSALON) 100 MG capsule Take 1-2 capsules (100-200 mg total) by mouth 3 (three) times daily as needed for cough. 20  capsule 0  . Biotin 5000 MCG CAPS Take 5,000 mcg by mouth daily.      . Brinzolamide-Brimonidine (SIMBRINZA) 1-0.2 % SUSP Place 1 drop into both eyes 2 (two) times daily.    . Calcium Carbonate Antacid (TUMS PO) Take 1 tablet by mouth daily as needed (heartburn).     . Carboxymethylcellul-Glycerin 0.5-0.9 % SOLN Apply 1 drop to eye daily as needed (eyes).     . Cholecalciferol (VITAMIN D3) 2000 units TABS Take 1 tablet by mouth daily.    . cyclobenzaprine (FLEXERIL) 10 MG tablet take 1 tablet by mouth three times a day if needed for muscle spasm 30 tablet 2  . diclofenac sodium (VOLTAREN) 1 % GEL Apply 4 g topically 4 (four) times daily. 100 g 2  . esomeprazole (NEXIUM) 40 MG capsule take 1 capsule by mouth once daily 30 capsule 3  . ezetimibe (ZETIA) 10 MG tablet TAKE 1 TABLET BY MOUTH EVERY DAY 30 tablet 0  . ezetimibe (ZETIA) 10 MG tablet TAKE 1 TABLET BY MOUTH EVERY DAY 30 tablet 0  . folic acid (FOLVITE) 093 MCG tablet Take 400 mcg by mouth daily.      Marland Kitchen HYDROcodone-acetaminophen (NORCO/VICODIN) 5-325 MG tablet Take 1 tablet by mouth every 6 (six) hours as needed for moderate pain. 30 tablet 0  . ibuprofen (ADVIL,MOTRIN) 800 MG tablet Take 800 mg by mouth every 8 (eight) hours as needed. For pain     . Krill Oil 300 MG CAPS Take 1 capsule by mouth daily.     Marland Kitchen latanoprost (XALATAN) 0.005 % ophthalmic solution Place 1 drop into both eyes daily.    Marland Kitchen loteprednol (LOTEMAX) 0.2 % SUSP Place 1 drop into both eyes 4 (four) times daily.    . metoprolol succinate (TOPROL-XL) 50 MG 24 hr tablet Take 1 tablet (50 mg total) by mouth daily. Take with or immediately following a meal. Please keep upcoming appt. Thank you 90 tablet 0  . Multiple Vitamins-Minerals (MULTIVITAMINS THER. W/MINERALS) TABS Take 1 tablet by mouth daily.      . rosuvastatin (CRESTOR) 40 MG tablet TAKE 1 TABLET BY MOUTH EVERY DAY 30 tablet 0  . rosuvastatin (CRESTOR) 40 MG tablet TAKE 1 TABLET BY MOUTH EVERY DAY  30 tablet 0   No  facility-administered medications prior to visit.     Allergies  Allergen Reactions  . Nubain [Nalbuphine Hcl] Anaphylaxis  . Corticosteroids Other (See Comments)    Due to glaucoma can not have steroids  . Dilaudid [Hydromorphone Hcl] Nausea And Vomiting    Severe itching  . Nubain [Nalbuphine Hcl]   . Oxycodone-Acetaminophen Itching  . Percodan [Oxycodone-Aspirin] Hives and Itching  . Tomato Rash    Rash, spots on tongue    Review of Systems  Constitutional: Negative for chills, fever and malaise/fatigue.       Body aches  HENT: Positive for ear pain (both ears). Negative for congestion and hearing loss.   Eyes: Positive for discharge (left ).  Respiratory: Positive for cough and sputum production (thick and yellow). Negative for shortness of breath.   Cardiovascular: Negative for chest pain, palpitations and leg swelling.  Gastrointestinal: Negative for abdominal pain, blood in stool, constipation, diarrhea, heartburn, nausea and vomiting.  Genitourinary: Negative for dysuria, frequency, hematuria and urgency.  Musculoskeletal: Negative for back pain, falls and myalgias.  Skin: Negative for rash.  Neurological: Negative for dizziness, sensory change, loss of consciousness, weakness and headaches.  Endo/Heme/Allergies: Negative for environmental allergies. Does not bruise/bleed easily.  Psychiatric/Behavioral: Negative for depression and suicidal ideas. The patient is not nervous/anxious and does not have insomnia.        Objective:    Physical Exam  Constitutional: She is oriented to person, place, and time. She appears well-developed and well-nourished.  HENT:  Right Ear: External ear normal.  Left Ear: External ear normal.  Nose: Right sinus exhibits maxillary sinus tenderness and frontal sinus tenderness. Left sinus exhibits maxillary sinus tenderness and frontal sinus tenderness.  + PND + errythema  Eyes: Conjunctivae are normal. Right eye exhibits no discharge.  Left eye exhibits no discharge.  Cardiovascular: Normal rate, regular rhythm and normal heart sounds.  No murmur heard. Pulmonary/Chest: Effort normal and breath sounds normal. No respiratory distress. She has no wheezes. She has no rales. She exhibits no tenderness.  Musculoskeletal: She exhibits no edema.  Lymphadenopathy:    She has cervical adenopathy.  Neurological: She is alert and oriented to person, place, and time.  Nursing note and vitals reviewed.   BP 139/82 (BP Location: Left Arm, Cuff Size: Large)   Pulse 83   Temp 98.8 F (37.1 C) (Oral)   Resp 16   Ht 5' 6.5" (1.689 m)   Wt 212 lb 12.8 oz (96.5 kg)   LMP 09/03/2007 (Approximate)   SpO2 98%   BMI 33.83 kg/m  Wt Readings from Last 3 Encounters:  12/29/17 212 lb 12.8 oz (96.5 kg)  12/26/17 210 lb 9.6 oz (95.5 kg)  08/07/17 208 lb (94.3 kg)   BP Readings from Last 3 Encounters:  12/29/17 139/82  12/26/17 (!) 160/90  08/07/17 120/66     Immunization History  Administered Date(s) Administered  . Tdap 09/22/2014  . Tetanus 07/03/2004    Health Maintenance  Topic Date Due  . HIV Screening  05/20/1976  . COLONOSCOPY  05/21/2011  . INFLUENZA VACCINE  04/02/2018  . MAMMOGRAM  06/06/2018  . PAP SMEAR  08/07/2020  . TETANUS/TDAP  09/22/2024  . Hepatitis C Screening  Completed    Lab Results  Component Value Date   WBC 5.3 01/31/2016   HGB 12.2 01/31/2016   HCT 38.7 01/31/2016   PLT 211.0 01/31/2016   GLUCOSE 98 05/15/2017   CHOL 179 05/15/2017   TRIG  86.0 05/15/2017   HDL 85.20 05/15/2017   LDLCALC 76 05/15/2017   ALT 66 (H) 05/15/2017   AST 41 (H) 05/15/2017   NA 140 05/15/2017   K 4.1 05/15/2017   CL 105 05/15/2017   CREATININE 0.71 05/15/2017   BUN 11 05/15/2017   CO2 27 05/15/2017   TSH 0.53 10/12/2015    Lab Results  Component Value Date   TSH 0.53 10/12/2015   Lab Results  Component Value Date   WBC 5.3 01/31/2016   HGB 12.2 01/31/2016   HCT 38.7 01/31/2016   MCV 84.3 01/31/2016    PLT 211.0 01/31/2016   Lab Results  Component Value Date   NA 140 05/15/2017   K 4.1 05/15/2017   CO2 27 05/15/2017   GLUCOSE 98 05/15/2017   BUN 11 05/15/2017   CREATININE 0.71 05/15/2017   BILITOT 0.7 05/15/2017   ALKPHOS 71 05/15/2017   AST 41 (H) 05/15/2017   ALT 66 (H) 05/15/2017   PROT 7.6 05/15/2017   ALBUMIN 4.4 05/15/2017   CALCIUM 9.9 05/15/2017   GFR 109.52 05/15/2017   Lab Results  Component Value Date   CHOL 179 05/15/2017   Lab Results  Component Value Date   HDL 85.20 05/15/2017   Lab Results  Component Value Date   LDLCALC 76 05/15/2017   Lab Results  Component Value Date   TRIG 86.0 05/15/2017   Lab Results  Component Value Date   CHOLHDL 2 05/15/2017   No results found for: HGBA1C       Assessment & Plan:   Problem List Items Addressed This Visit    None    Visit Diagnoses    Pansinusitis, unspecified chronicity    -  Primary   Relevant Medications   azithromycin (ZITHROMAX Z-PAK) 250 MG tablet   chlorpheniramine-HYDROcodone (TUSSIONEX PENNKINETIC ER) 10-8 MG/5ML SUER   Bronchitis       Relevant Medications   azithromycin (ZITHROMAX Z-PAK) 250 MG tablet   chlorpheniramine-HYDROcodone (TUSSIONEX PENNKINETIC ER) 10-8 MG/5ML SUER      I am having Ilisa B. Roach start on azithromycin and chlorpheniramine-HYDROcodone. I am also having her maintain her multivitamins ther. w/minerals, folic acid, Biotin, ibuprofen, Calcium Carbonate Antacid (TUMS PO), aspirin, carboxymethylcellul-glycerin, Krill Oil, aspirin-acetaminophen-caffeine, esomeprazole, latanoprost, Brinzolamide-Brimonidine, Vitamin D3, HYDROcodone-acetaminophen, cyclobenzaprine, diclofenac sodium, loteprednol, metoprolol succinate, ezetimibe, rosuvastatin, ezetimibe, benzonatate, and azelastine.  Meds ordered this encounter  Medications  . azithromycin (ZITHROMAX Z-PAK) 250 MG tablet    Sig: As directed    Dispense:  6 each    Refill:  1  . DISCONTD:  chlorpheniramine-HYDROcodone (TUSSIONEX) 10-8 MG/5ML suspension 5 mL  . chlorpheniramine-HYDROcodone (TUSSIONEX PENNKINETIC ER) 10-8 MG/5ML SUER    Sig: Take 5 mLs by mouth at bedtime as needed for cough.    Dispense:  140 mL    Refill:  0    CMA served as scribe during this visit. History, Physical and Plan performed by medical provider. Documentation and orders reviewed and attested to.  Ann Held, DO

## 2018-01-08 ENCOUNTER — Ambulatory Visit: Payer: PRIVATE HEALTH INSURANCE | Admitting: Family Medicine

## 2018-02-03 NOTE — Progress Notes (Signed)
Cardiology Office Note    Date:  02/04/2018   ID:  Yvonne Sanders, DOB 1961-06-09, MRN [id number]  PCP:  Ann Held, DO  Cardiologist: Sinclair Grooms, MD   Chief Complaint  Patient presents with  . Hypertension    History of Present Illness:  Yvonne Sanders is a 57 y.o. female who presents for aortic valve sclerosis/mild stenosis, hypertension, and LVH.  She is doing well.  Denies dyspnea.  No chest pain or orthopnea.  Not exercising because of an orthopedic injury to her daily while exercising.  No medication side effects.   Past Medical History:  Diagnosis Date  . Abnormal pap 1989   cryo  . Arthritis   . Chicken pox   . Endometriosis   . 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 Glaucoma   . Headache(784.0)   . Hyperlipidemia   . Hypertension       . Migraine   . Positive TB test     Past Surgical History:  Procedure Laterality Date  . APPENDECTOMY  75  . BACK SURGERY  99   HNP  . BREAST BIOPSY     benign  . CHOLECYSTECTOMY  88  . DIAGNOSTIC LAPAROSCOPY  88   endometrious  . EYE SURGERY  12   laser for glaucoma  . LUMBAR FUSION  08/2011   l 3-4  . LUMBAR FUSION     l 4-5  . RIGHT OOPHORECTOMY  75    Current Medications: Outpatient Medications Prior to Visit  Medication Sig Dispense Refill  . aspirin-acetaminophen-caffeine (EXCEDRIN MIGRAINE) 250-250-65 MG tablet Take 1 tablet by mouth every 6 (six) hours as needed for headache or migraine.    Marland Kitchen azelastine (ASTELIN) 0.1 % nasal spray Place 1 spray into both nostrils 2 (two) times daily. Use in each nostril as directed 30 mL 0  . benzonatate (TESSALON) 100 MG capsule Take 1-2 capsules (100-200 mg total) by mouth 3 (three) times daily as needed for cough. 20 capsule 0  . Biotin 5000 MCG CAPS Take 5,000 mcg by mouth daily.      . Brinzolamide-Brimonidine (SIMBRINZA) 1-0.2 % SUSP Place 1 drop into both eyes 2 (two) times daily.    . Calcium Carbonate Antacid (TUMS PO) Take 1 tablet by  mouth daily as needed (heartburn).     . Carboxymethylcellul-Glycerin 0.5-0.9 % SOLN Apply 1 drop to eye daily as needed (eyes).     . Cholecalciferol (VITAMIN D3) 2000 units TABS Take 1 tablet by mouth daily.    . cyclobenzaprine (FLEXERIL) 10 MG tablet take 1 tablet by mouth three times a day if needed for muscle spasm 30 tablet 2  . diclofenac sodium (VOLTAREN) 1 % GEL Apply 4 g topically 4 (four) times daily. 100 g 2  . esomeprazole (NEXIUM) 40 MG capsule take 1 capsule by mouth once daily 30 capsule 3  . ezetimibe (ZETIA) 10 MG tablet TAKE 1 TABLET BY MOUTH EVERY DAY 30 tablet 0  . folic acid (FOLVITE) 409 MCG tablet Take 400 mcg by mouth daily.      Marland Kitchen HYDROcodone-acetaminophen (NORCO/VICODIN) 5-325 MG tablet Take 1 tablet by mouth every 6 (six) hours as needed for moderate pain. 30 tablet 0  . ibuprofen (ADVIL,MOTRIN) 800 MG tablet Take 800 mg by mouth every 8 (eight) hours as needed. For pain     . Krill Oil 300 MG CAPS Take 1 capsule by mouth daily.     Marland Kitchen  latanoprost (XALATAN) 0.005 % ophthalmic solution Place 1 drop into both eyes daily.    . Multiple Vitamins-Minerals (MULTIVITAMINS THER. W/MINERALS) TABS Take 1 tablet by mouth daily.      . rosuvastatin (CRESTOR) 40 MG tablet TAKE 1 TABLET BY MOUTH EVERY DAY 30 tablet 0  . aspirin 325 MG tablet Take 325 mg by mouth daily. Pt doesn't take when she takes Excedrin    . metoprolol succinate (TOPROL-XL) 50 MG 24 hr tablet Take 1 tablet (50 mg total) by mouth daily. Take with or immediately following a meal. Please keep upcoming appt. Thank you 90 tablet 0  . azithromycin (ZITHROMAX Z-PAK) 250 MG tablet As directed (Patient not taking: Reported on 02/04/2018) 6 each 1  . chlorpheniramine-HYDROcodone (TUSSIONEX PENNKINETIC ER) 10-8 MG/5ML SUER Take 5 mLs by mouth at bedtime as needed for cough. (Patient not taking: Reported on 02/04/2018) 140 mL 0  . ezetimibe (ZETIA) 10 MG tablet TAKE 1 TABLET BY MOUTH EVERY DAY (Patient not taking: Reported on  02/04/2018) 30 tablet 0  . loteprednol (LOTEMAX) 0.2 % SUSP Place 1 drop into both eyes 4 (four) times daily.     No facility-administered medications prior to visit.      Allergies:   Nubain [nalbuphine hcl]; Corticosteroids; Dilaudid [hydromorphone hcl]; Nubain [nalbuphine hcl]; Oxycodone-acetaminophen; Percodan [oxycodone-aspirin]; and Tomato   Social History   Socioeconomic History  . Marital status: Married    Spouse name: Not on file  . Number of children: Not on file  . Years of education: Not on file  . Highest education level: Not on file  Occupational History  . Not on file  Social Needs  . Financial resource strain: Not on file  . Food insecurity:    Worry: Not on file    Inability: Not on file  . Transportation needs:    Medical: Not on file    Non-medical: Not on file  Tobacco Use  . Smoking status: Never Smoker  . Smokeless tobacco: Never Used  Substance and Sexual Activity  . Alcohol use: No    Comment: occ. wine  . Drug use: No  . Sexual activity: Yes    Partners: Male    Birth control/protection: Post-menopausal  Lifestyle  . Physical activity:    Days per week: Not on file    Minutes per session: Not on file  . Stress: Not on file  Relationships  . Social connections:    Talks on phone: Not on file    Gets together: Not on file    Attends religious service: Not on file    Active member of club or organization: Not on file    Attends meetings of clubs or organizations: Not on file    Relationship status: Not on file  Other Topics Concern  . Not on file  Social History Narrative  . Not on file     Family History:  The patient's family history includes Breast cancer in her maternal aunt and maternal grandmother; Glaucoma in her father; Heart disease in her father; Hypertension in her father; Kidney disease in her father; Macular degeneration in her father; Prostate cancer in her father; Renal Disease in her father; Stroke in her father.   ROS:     Please see the history of present illness.    Vision disturbance and glaucoma.  Otherwise unremarkable. All other systems reviewed and are negative.   PHYSICAL EXAM:   VS:  BP 126/82   Pulse 70   Ht 5' 5.5" (  1.664 m)   Wt 209 lb 12.8 oz (95.2 kg)   LMP 09/03/2007 (Approximate)   BMI 34.38 kg/m    GEN: Well nourished, well developed, in no acute distress  HEENT: normal  Neck: no JVD, carotid bruits, or masses Cardiac: RRR; no murmurs, rubs, or gallops,no edema  Respiratory:  clear to auscultation bilaterally, normal work of breathing GI: soft, nontender, nondistended, + BS MS: no deformity or atrophy  Skin: warm and dry, no rash Neuro:  Alert and Oriented x 3, Strength and sensation are intact Psych: euthymic mood, full affect  Wt Readings from Last 3 Encounters:  02/04/18 209 lb 12.8 oz (95.2 kg)  12/29/17 212 lb 12.8 oz (96.5 kg)  12/26/17 210 lb 9.6 oz (95.5 kg)      Studies/Labs Reviewed:   EKG:  EKG normal sinus rhythm with prominent voltage consistent with LVH.  Recent Labs: 05/15/2017: ALT 66; BUN 11; Creatinine, Ser 0.71; Potassium 4.1; Sodium 140   Lipid Panel    Component Value Date/Time   CHOL 179 05/15/2017 0919   TRIG 86.0 05/15/2017 0919   HDL 85.20 05/15/2017 0919   CHOLHDL 2 05/15/2017 0919   VLDL 17.2 05/15/2017 0919   LDLCALC 76 05/15/2017 0919    Additional studies/ records that were reviewed today include:  No new data Last echocardiogram 2015.    ASSESSMENT:    1. Essential hypertension   2. LVH (left ventricular hypertrophy)   3. Other hyperlipidemia   4. Heart murmur, systolic      PLAN:  In order of problems listed above:  1. We discussed the importance of keeping blood pressure less than 130/80 mmHg.  Current numbers are quite good.  I further encouraged aerobic activity, weight loss, and salt restriction. 2. At some future point we will reassess LVH to look for evidence of regression.  If no evidence of progression,  possibly consider MR study to exclude the possibility of infiltrative disease or hypertrophic cardiomyopathy. 3. DL target should be less than 70.  Continue rosuvastatin.  Lipids are followed by primary care.  Aerobic activity, weight loss, decrease saturated fats and sodium in diet, LDL target less than 70, blood pressure target 130/80 mmHg.  Clinical follow-up in 1 year.  Doppler echocardiogram in the next year or 2 to reassess LVH.    Medication Adjustments/Labs and Tests Ordered: Current medicines are reviewed at length with the patient today.  Concerns regarding medicines are outlined above.  Medication changes, Labs and Tests ordered today are listed in the Patient Instructions below. Patient Instructions  Medication Instructions:  1) DECREASE Aspirin to 81mg  once daily  Labwork: None  Testing/Procedures: None  Follow-Up: Your physician wants you to follow-up in: 1 year with Dr. Tamala Julian.  You will receive a reminder letter in the mail two months in advance. If you don't receive a letter, please call our office to schedule the follow-up appointment.   Any Other Special Instructions Will Be Listed Below (If Applicable).  Your physician recommends that you have 150 minutes per week of moderate intensity exercise.  He also recommends that you follow a low sodium diet.    If you need a refill on your cardiac medications before your next appointment, please call your pharmacy.      Signed, Sinclair Grooms, MD  02/04/2018 12:49 PM    [facility] [address], [place], [place]  [address] Phone: [phone]; Fax: [phone]

## 2018-02-04 ENCOUNTER — Ambulatory Visit: Payer: PRIVATE HEALTH INSURANCE | Admitting: Interventional Cardiology

## 2018-02-04 ENCOUNTER — Encounter: Payer: Self-pay | Admitting: Interventional Cardiology

## 2018-02-04 ENCOUNTER — Encounter

## 2018-02-04 VITALS — BP 126/82 | HR 70 | Ht 65.5 in | Wt 209.8 lb

## 2018-02-04 DIAGNOSIS — E7849 Other hyperlipidemia: Secondary | ICD-10-CM | POA: Diagnosis not present

## 2018-02-04 DIAGNOSIS — I517 Cardiomegaly: Secondary | ICD-10-CM

## 2018-02-04 DIAGNOSIS — R011 Cardiac murmur, unspecified: Secondary | ICD-10-CM | POA: Diagnosis not present

## 2018-02-04 DIAGNOSIS — I10 Essential (primary) hypertension: Secondary | ICD-10-CM | POA: Diagnosis not present

## 2018-02-04 MED ORDER — ASPIRIN EC 81 MG PO TBEC: 81.0000 mg | DELAYED_RELEASE_TABLET | Freq: Every day | ORAL | 3 refills | Status: DC

## 2018-02-04 MED ORDER — METOPROLOL SUCCINATE ER 50 MG PO TB24: 50.0000 mg | ORAL_TABLET | Freq: Every day | ORAL | 3 refills | Status: DC

## 2018-02-04 NOTE — Patient Instructions (Signed)
Medication Instructions:  1) DECREASE Aspirin to 81mg  once daily  Labwork: None  Testing/Procedures: None  Follow-Up: Your physician wants you to follow-up in: 1 year with Dr. Tamala Julian.  You will receive a reminder letter in the mail two months in advance. If you don't receive a letter, please call our office to schedule the follow-up appointment.   Any Other Special Instructions Will Be Listed Below (If Applicable).  Your physician recommends that you have 150 minutes per week of moderate intensity exercise.  He also recommends that you follow a low sodium diet.    If you need a refill on your cardiac medications before your next appointment, please call your pharmacy.

## 2018-02-05 ENCOUNTER — Ambulatory Visit: Payer: PRIVATE HEALTH INSURANCE | Admitting: Family Medicine

## 2018-02-19 ENCOUNTER — Encounter: Payer: Self-pay | Admitting: Family Medicine

## 2018-02-19 ENCOUNTER — Ambulatory Visit: Payer: PRIVATE HEALTH INSURANCE | Admitting: Family Medicine

## 2018-02-19 VITALS — BP 154/74 | HR 76 | Temp 98.3°F | Resp 16 | Ht 65.35 in | Wt 210.6 lb

## 2018-02-19 DIAGNOSIS — I10 Essential (primary) hypertension: Secondary | ICD-10-CM | POA: Diagnosis not present

## 2018-02-19 DIAGNOSIS — E785 Hyperlipidemia, unspecified: Secondary | ICD-10-CM

## 2018-02-19 DIAGNOSIS — G894 Chronic pain syndrome: Secondary | ICD-10-CM

## 2018-02-19 DIAGNOSIS — M25561 Pain in right knee: Secondary | ICD-10-CM

## 2018-02-19 DIAGNOSIS — L258 Unspecified contact dermatitis due to other agents: Secondary | ICD-10-CM | POA: Diagnosis not present

## 2018-02-19 MED ORDER — TRIAMCINOLONE ACETONIDE 0.1 % EX CREA: 1.0000 "application " | TOPICAL_CREAM | Freq: Two times a day (BID) | CUTANEOUS | 2 refills | Status: DC

## 2018-02-19 MED ORDER — HYDROCODONE-ACETAMINOPHEN 5-325 MG PO TABS: 1.0000 | ORAL_TABLET | Freq: Four times a day (QID) | ORAL | 0 refills | Status: DC | PRN

## 2018-02-19 MED ORDER — DICLOFENAC SODIUM 1 % TD GEL: 4.0000 g | Freq: Four times a day (QID) | TRANSDERMAL | 2 refills | Status: DC

## 2018-02-19 MED ORDER — CYCLOBENZAPRINE HCL 10 MG PO TABS: ORAL_TABLET | ORAL | 2 refills | Status: DC

## 2018-02-19 MED ORDER — EZETIMIBE 10 MG PO TABS: 10.0000 mg | ORAL_TABLET | Freq: Every day | ORAL | 0 refills | Status: DC

## 2018-02-19 MED ORDER — ROSUVASTATIN CALCIUM 40 MG PO TABS: 40.0000 mg | ORAL_TABLET | Freq: Every day | ORAL | 0 refills | Status: DC

## 2018-02-19 NOTE — Assessment & Plan Note (Addendum)
Per cardiology 

## 2018-02-19 NOTE — Assessment & Plan Note (Signed)
Encouraged heart healthy diet, increase exercise, avoid trans fats, consider a krill oil cap daily 

## 2018-02-19 NOTE — Progress Notes (Signed)
Subjective:  I acted as a Education administrator for [employer]. Yancey Flemings, [place]   Patient ID: Yvonne Sanders, female    DOB: 1961-01-21, 57 y.o.   MRN: [id number]  Chief Complaint  Patient presents with  . Hyperlipidemia  . Hypertension    HPI  Patient is in today for follow up on hypertension, and hyperlipidemia.  Patient Care Team: Carollee Herter, Alferd Apa, DO as PCP - General (Family Medicine) Janne Lab, MD (Obstetrics and Gynecology) Verneita Griffes, MD (Ophthalmology) Belva Crome, MD as Consulting Physician (Cardiology) Hortencia Pilar, MD as Consulting Physician (Ophthalmology)   Past Medical History:  Diagnosis Date  . Abnormal pap 1989   cryo  . Arthritis   . Chicken pox   . Endometriosis   . 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 Glaucoma   . Headache(784.0)   . Hyperlipidemia   . Hypertension       . Migraine   . Positive TB test     Past Surgical History:  Procedure Laterality Date  . APPENDECTOMY  75  . BACK SURGERY  99   HNP  . BREAST BIOPSY     benign  . CHOLECYSTECTOMY  88  . DIAGNOSTIC LAPAROSCOPY  88   endometrious  . EYE SURGERY  12   laser for glaucoma  . LUMBAR FUSION  08/2011   l 3-4  . LUMBAR FUSION     l 4-5  . RIGHT OOPHORECTOMY  4    Family History  Problem Relation Age of Onset  . Hypertension Father   . Glaucoma Father   . Prostate cancer Father   . Renal Disease Father   . Macular degeneration Father   . Stroke Father   . Kidney disease Father   . Heart disease Father   . Breast cancer Maternal Grandmother   . Breast cancer Maternal Aunt     Social History   Socioeconomic History  . Marital status: Married    Spouse name: Not on file  . Number of children: Not on file  . Years of education: Not on file  . Highest education level: Not on file  Occupational History  . Not on file  Social Needs  . Financial resource strain: Not on file  . Food insecurity:    Worry: Not on file    Inability: Not on file    . Transportation needs:    Medical: Not on file    Non-medical: Not on file  Tobacco Use  . Smoking status: Never Smoker  . Smokeless tobacco: Never Used  Substance and Sexual Activity  . Alcohol use: No    Comment: occ. wine  . Drug use: No  . Sexual activity: Yes    Partners: Male    Birth control/protection: Post-menopausal  Lifestyle  . Physical activity:    Days per week: Not on file    Minutes per session: Not on file  . Stress: Not on file  Relationships  . Social connections:    Talks on phone: Not on file    Gets together: Not on file    Attends religious service: Not on file    Active member of club or organization: Not on file    Attends meetings of clubs or organizations: Not on file    Relationship status: Not on file  . Intimate partner violence:    Fear of current or ex partner: Not on file    Emotionally abused: Not on file  Physically abused: Not on file    Forced sexual activity: Not on file  Other Topics Concern  . Not on file  Social History Narrative  . Not on file    Outpatient Medications Prior to Visit  Medication Sig Dispense Refill  . aspirin EC 81 MG tablet Take 1 tablet (81 mg total) by mouth daily. 90 tablet 3  . aspirin-acetaminophen-caffeine (EXCEDRIN MIGRAINE) 250-250-65 MG tablet Take 1 tablet by mouth every 6 (six) hours as needed for headache or migraine.    Marland Kitchen azelastine (ASTELIN) 0.1 % nasal spray Place 1 spray into both nostrils 2 (two) times daily. Use in each nostril as directed 30 mL 0  . benzonatate (TESSALON) 100 MG capsule Take 1-2 capsules (100-200 mg total) by mouth 3 (three) times daily as needed for cough. 20 capsule 0  . Biotin 5000 MCG CAPS Take 5,000 mcg by mouth daily.      . Brinzolamide-Brimonidine (SIMBRINZA) 1-0.2 % SUSP Place 1 drop into both eyes 2 (two) times daily.    . Calcium Carbonate Antacid (TUMS PO) Take 1 tablet by mouth daily as needed (heartburn).     . Carboxymethylcellul-Glycerin 0.5-0.9 % SOLN  Apply 1 drop to eye daily as needed (eyes).     . Cholecalciferol (VITAMIN D3) 2000 units TABS Take 1 tablet by mouth daily.    Marland Kitchen esomeprazole (NEXIUM) 40 MG capsule take 1 capsule by mouth once daily 30 capsule 3  . folic acid (FOLVITE) 412 MCG tablet Take 400 mcg by mouth daily.      Marland Kitchen ibuprofen (ADVIL,MOTRIN) 800 MG tablet Take 800 mg by mouth every 8 (eight) hours as needed. For pain     . Krill Oil 300 MG CAPS Take 1 capsule by mouth daily.     Marland Kitchen latanoprost (XALATAN) 0.005 % ophthalmic solution Place 1 drop into both eyes daily.    . metoprolol succinate (TOPROL-XL) 50 MG 24 hr tablet Take 1 tablet (50 mg total) by mouth daily. Take with or immediately following a meal. 90 tablet 3  . Multiple Vitamins-Minerals (MULTIVITAMINS THER. W/MINERALS) TABS Take 1 tablet by mouth daily.      . cyclobenzaprine (FLEXERIL) 10 MG tablet take 1 tablet by mouth three times a day if needed for muscle spasm 30 tablet 2  . diclofenac sodium (VOLTAREN) 1 % GEL Apply 4 g topically 4 (four) times daily. 100 g 2  . ezetimibe (ZETIA) 10 MG tablet TAKE 1 TABLET BY MOUTH EVERY DAY 30 tablet 0  . HYDROcodone-acetaminophen (NORCO/VICODIN) 5-325 MG tablet Take 1 tablet by mouth every 6 (six) hours as needed for moderate pain. 30 tablet 0  . rosuvastatin (CRESTOR) 40 MG tablet TAKE 1 TABLET BY MOUTH EVERY DAY 30 tablet 0   No facility-administered medications prior to visit.     Allergies  Allergen Reactions  . Nubain [Nalbuphine Hcl] Anaphylaxis  . Corticosteroids Other (See Comments)    Due to glaucoma can not have steroids  . Dilaudid [Hydromorphone Hcl] Nausea And Vomiting    Severe itching  . Nubain [Nalbuphine Hcl]   . Oxycodone-Acetaminophen Itching  . Percodan [Oxycodone-Aspirin] Hives and Itching  . Tomato Rash    Rash, spots on tongue    Review of Systems  Constitutional: Negative for chills, fever and malaise/fatigue.  HENT: Negative for congestion and hearing loss.   Eyes: Negative for  discharge.  Respiratory: Negative for cough, sputum production and shortness of breath.   Cardiovascular: Negative for chest pain, palpitations and leg swelling.  Gastrointestinal: Negative for abdominal pain, blood in stool, constipation, diarrhea, heartburn, nausea and vomiting.  Genitourinary: Negative for dysuria, frequency, hematuria and urgency.  Musculoskeletal: Negative for back pain, falls and myalgias.  Skin: Negative for rash.  Neurological: Negative for dizziness, sensory change, loss of consciousness, weakness and headaches.  Endo/Heme/Allergies: Negative for environmental allergies. Does not bruise/bleed easily.  Psychiatric/Behavioral: Negative for depression and suicidal ideas. The patient is not nervous/anxious and does not have insomnia.        Objective:    Physical Exam  Constitutional: She is oriented to person, place, and time. She appears well-developed and well-nourished.  HENT:  Head: Normocephalic and atraumatic.  Eyes: Conjunctivae and EOM are normal.  Neck: Normal range of motion. Neck supple. No JVD present. Carotid bruit is not present. No thyromegaly present.  Cardiovascular: Normal rate and regular rhythm.  Murmur heard. Pulmonary/Chest: Effort normal and breath sounds normal. No respiratory distress. She has no wheezes. She has no rales. She exhibits no tenderness.  Musculoskeletal: She exhibits no edema.  Neurological: She is alert and oriented to person, place, and time.  Psychiatric: She has a normal mood and affect.  Nursing note and vitals reviewed.   BP (!) 154/74 (BP Location: Left Arm, Patient Position: Sitting, Cuff Size: Large)   Pulse 76   Temp 98.3 F (36.8 C) (Oral)   Resp 16   Ht 5' 5.35" (1.66 m)   Wt 210 lb 9.6 oz (95.5 kg)   LMP 09/03/2007 (Approximate)   SpO2 100%   BMI 34.67 kg/m  Wt Readings from Last 3 Encounters:  02/19/18 210 lb 9.6 oz (95.5 kg)  02/04/18 209 lb 12.8 oz (95.2 kg)  12/29/17 212 lb 12.8 oz (96.5 kg)    BP Readings from Last 3 Encounters:  02/19/18 (!) 154/74  02/04/18 126/82  12/29/17 139/82     Immunization History  Administered Date(s) Administered  . Tdap 09/22/2014  . Tetanus 07/03/2004    Health Maintenance  Topic Date Due  . HIV Screening  05/20/1976  . COLONOSCOPY  05/21/2011  . INFLUENZA VACCINE  04/02/2018  . MAMMOGRAM  06/06/2018  . PAP SMEAR  08/07/2020  . TETANUS/TDAP  09/22/2024  . Hepatitis C Screening  Completed    Lab Results  Component Value Date   WBC 5.3 01/31/2016   HGB 12.2 01/31/2016   HCT 38.7 01/31/2016   PLT 211.0 01/31/2016   GLUCOSE 98 05/15/2017   CHOL 179 05/15/2017   TRIG 86.0 05/15/2017   HDL 85.20 05/15/2017   LDLCALC 76 05/15/2017   ALT 66 (H) 05/15/2017   AST 41 (H) 05/15/2017   NA 140 05/15/2017   K 4.1 05/15/2017   CL 105 05/15/2017   CREATININE 0.71 05/15/2017   BUN 11 05/15/2017   CO2 27 05/15/2017   TSH 0.53 10/12/2015    Lab Results  Component Value Date   TSH 0.53 10/12/2015   Lab Results  Component Value Date   WBC 5.3 01/31/2016   HGB 12.2 01/31/2016   HCT 38.7 01/31/2016   MCV 84.3 01/31/2016   PLT 211.0 01/31/2016   Lab Results  Component Value Date   NA 140 05/15/2017   K 4.1 05/15/2017   CO2 27 05/15/2017   GLUCOSE 98 05/15/2017   BUN 11 05/15/2017   CREATININE 0.71 05/15/2017   BILITOT 0.7 05/15/2017   ALKPHOS 71 05/15/2017   AST 41 (H) 05/15/2017   ALT 66 (H) 05/15/2017   PROT 7.6 05/15/2017   ALBUMIN 4.4 05/15/2017  CALCIUM 9.9 05/15/2017   GFR 109.52 05/15/2017   Lab Results  Component Value Date   CHOL 179 05/15/2017   Lab Results  Component Value Date   HDL 85.20 05/15/2017   Lab Results  Component Value Date   LDLCALC 76 05/15/2017   Lab Results  Component Value Date   TRIG 86.0 05/15/2017   Lab Results  Component Value Date   CHOLHDL 2 05/15/2017   No results found for: HGBA1C       Assessment & Plan:   Problem List Items Addressed This Visit       Unprioritized   HTN (hypertension) - Primary    Per cardiology      Relevant Medications   ezetimibe (ZETIA) 10 MG tablet   rosuvastatin (CRESTOR) 40 MG tablet   Hyperlipidemia    Encouraged heart healthy diet, increase exercise, avoid trans fats, consider a krill oil cap daily      Relevant Medications   ezetimibe (ZETIA) 10 MG tablet   rosuvastatin (CRESTOR) 40 MG tablet   Other Relevant Orders   Lipid panel   Comprehensive metabolic panel   CBC with Differential/Platelet    Other Visit Diagnoses    Acute pain of right knee       Relevant Medications   diclofenac sodium (VOLTAREN) 1 % GEL   Contact dermatitis due to other agent, unspecified contact dermatitis type       Relevant Medications   triamcinolone cream (KENALOG) 0.1 %   Chronic pain syndrome       Relevant Medications   HYDROcodone-acetaminophen (NORCO/VICODIN) 5-325 MG tablet   cyclobenzaprine (FLEXERIL) 10 MG tablet      I have changed Yvonne Sanders's ezetimibe and rosuvastatin. I am also having her start on triamcinolone cream. Additionally, I am having her maintain her multivitamins ther. w/minerals, folic acid, Biotin, ibuprofen, Calcium Carbonate Antacid (TUMS PO), carboxymethylcellul-glycerin, Krill Oil, aspirin-acetaminophen-caffeine, esomeprazole, latanoprost, Brinzolamide-Brimonidine, Vitamin D3, benzonatate, azelastine, metoprolol succinate, aspirin EC, HYDROcodone-acetaminophen, cyclobenzaprine, and diclofenac sodium.  Meds ordered this encounter  Medications  . ezetimibe (ZETIA) 10 MG tablet    Sig: Take 1 tablet (10 mg total) by mouth daily.    Dispense:  30 tablet    Refill:  0  . rosuvastatin (CRESTOR) 40 MG tablet    Sig: Take 1 tablet (40 mg total) by mouth daily.    Dispense:  30 tablet    Refill:  0  . DISCONTD: diclofenac sodium (VOLTAREN) 1 % GEL    Sig: Apply 4 g topically 4 (four) times daily.    Dispense:  100 g    Refill:  2  . triamcinolone cream (KENALOG) 0.1 %    Sig:  Apply 1 application topically 2 (two) times daily.    Dispense:  45 g    Refill:  2  . HYDROcodone-acetaminophen (NORCO/VICODIN) 5-325 MG tablet    Sig: Take 1 tablet by mouth every 6 (six) hours as needed for moderate pain.    Dispense:  30 tablet    Refill:  0  . cyclobenzaprine (FLEXERIL) 10 MG tablet    Sig: take 1 tablet by mouth three times a day if needed for muscle spasm    Dispense:  30 tablet    Refill:  2  . diclofenac sodium (VOLTAREN) 1 % GEL    Sig: Apply 4 g topically 4 (four) times daily.    Dispense:  100 g    Refill:  2    CMA served as Education administrator during  this visit. History, Physical and Plan performed by medical provider. Documentation and orders reviewed and attested to.  Ann Held, DO

## 2018-02-19 NOTE — Patient Instructions (Signed)

## 2018-02-20 LAB — CBC WITH DIFFERENTIAL/PLATELET
BASOS PCT: 1 % (ref 0.0–3.0)
Basophils Absolute: 0.1 10*3/uL (ref 0.0–0.1)
Eosinophils Absolute: 0.1 10*3/uL (ref 0.0–0.7)
Eosinophils Relative: 1.1 % (ref 0.0–5.0)
HEMATOCRIT: 39.4 % (ref 36.0–46.0)
Hemoglobin: 12.7 g/dL (ref 12.0–15.0)
LYMPHS ABS: 2.7 10*3/uL (ref 0.7–4.0)
Lymphocytes Relative: 43.5 % (ref 12.0–46.0)
MCHC: 32.2 g/dL (ref 30.0–36.0)
MCV: 85.2 fl (ref 78.0–100.0)
MONO ABS: 0.5 10*3/uL (ref 0.1–1.0)
Monocytes Relative: 7.8 % (ref 3.0–12.0)
NEUTROS ABS: 2.9 10*3/uL (ref 1.4–7.7)
NEUTROS PCT: 46.6 % (ref 43.0–77.0)
PLATELETS: 218 10*3/uL (ref 150.0–400.0)
RBC: 4.63 Mil/uL (ref 3.87–5.11)
RDW: 13 % (ref 11.5–15.5)
WBC: 6.2 10*3/uL (ref 4.0–10.5)

## 2018-02-20 LAB — COMPREHENSIVE METABOLIC PANEL
ALK PHOS: 74 U/L (ref 39–117)
ALT: 40 U/L — AB (ref 0–35)
AST: 33 U/L (ref 0–37)
Albumin: 4.5 g/dL (ref 3.5–5.2)
BILIRUBIN TOTAL: 0.5 mg/dL (ref 0.2–1.2)
BUN: 8 mg/dL (ref 6–23)
CO2: 26 mEq/L (ref 19–32)
Calcium: 10 mg/dL (ref 8.4–10.5)
Chloride: 107 mEq/L (ref 96–112)
Creatinine, Ser: 0.73 mg/dL (ref 0.40–1.20)
GFR: 105.77 mL/min (ref >60.00)
Glucose, Bld: 86 mg/dL (ref 70–99)
Potassium: 4.2 mEq/L (ref 3.5–5.1)
Sodium: 143 mEq/L (ref 135–145)
TOTAL PROTEIN: 7.5 g/dL (ref 6.0–8.3)

## 2018-02-20 LAB — LIPID PANEL
Cholesterol: 194 mg/dL (ref 0–200)
HDL: 71.3 mg/dL (ref >39.00)
LDL Cholesterol: 107 mg/dL — ABNORMAL HIGH (ref 0–99)
NonHDL: 122.54
TRIGLYCERIDES: 80 mg/dL (ref 0.0–149.0)
Total CHOL/HDL Ratio: 3
VLDL: 16 mg/dL (ref 0.0–40.0)

## 2018-04-23 ENCOUNTER — Ambulatory Visit
Admission: RE | Admit: 2018-04-23 | Discharge: 2018-04-23 | Disposition: A | Discharge status: Home / Self Care | Payer: PRIVATE HEALTH INSURANCE | Source: Ambulatory Visit | Attending: Obstetrics and Gynecology | Admitting: Obstetrics and Gynecology

## 2018-04-23 DIAGNOSIS — Z1231 Encounter for screening mammogram for malignant neoplasm of breast: Secondary | ICD-10-CM

## 2018-04-29 ENCOUNTER — Other Ambulatory Visit: Payer: Self-pay | Admitting: Family Medicine

## 2018-04-29 DIAGNOSIS — E785 Hyperlipidemia, unspecified: Secondary | ICD-10-CM

## 2018-08-13 ENCOUNTER — Ambulatory Visit (INDEPENDENT_AMBULATORY_CARE_PROVIDER_SITE_OTHER): Payer: PRIVATE HEALTH INSURANCE | Admitting: Family Medicine

## 2018-08-13 ENCOUNTER — Encounter: Payer: Self-pay | Admitting: Family Medicine

## 2018-08-13 VITALS — BP 124/74 | HR 66 | Temp 98.8°F | Resp 16 | Ht 65.0 in | Wt 206.0 lb

## 2018-08-13 DIAGNOSIS — I10 Essential (primary) hypertension: Secondary | ICD-10-CM | POA: Diagnosis not present

## 2018-08-13 DIAGNOSIS — M25561 Pain in right knee: Secondary | ICD-10-CM

## 2018-08-13 DIAGNOSIS — K219 Gastro-esophageal reflux disease without esophagitis: Secondary | ICD-10-CM

## 2018-08-13 DIAGNOSIS — G894 Chronic pain syndrome: Secondary | ICD-10-CM

## 2018-08-13 DIAGNOSIS — E785 Hyperlipidemia, unspecified: Secondary | ICD-10-CM | POA: Diagnosis not present

## 2018-08-13 LAB — COMPREHENSIVE METABOLIC PANEL
ALK PHOS: 81 U/L (ref 39–117)
ALT: 34 U/L (ref 0–35)
AST: 25 U/L (ref 0–37)
Albumin: 4.3 g/dL (ref 3.5–5.2)
BUN: 8 mg/dL (ref 6–23)
CO2: 29 meq/L (ref 19–32)
CREATININE: 0.64 mg/dL (ref 0.40–1.20)
Calcium: 9.4 mg/dL (ref 8.4–10.5)
Chloride: 108 mEq/L (ref 96–112)
GFR: 122.9 mL/min (ref >60.00)
Glucose, Bld: 93 mg/dL (ref 70–99)
Potassium: 4.1 mEq/L (ref 3.5–5.1)
SODIUM: 142 meq/L (ref 135–145)
Total Bilirubin: 0.6 mg/dL (ref 0.2–1.2)
Total Protein: 7.1 g/dL (ref 6.0–8.3)

## 2018-08-13 LAB — LIPID PANEL
Cholesterol: 240 mg/dL — ABNORMAL HIGH (ref 0–200)
HDL: 58.6 mg/dL (ref >39.00)
LDL Cholesterol: 165 mg/dL — ABNORMAL HIGH (ref 0–99)
NONHDL: 181.58
TRIGLYCERIDES: 81 mg/dL (ref 0.0–149.0)
Total CHOL/HDL Ratio: 4
VLDL: 16.2 mg/dL (ref 0.0–40.0)

## 2018-08-13 MED ORDER — DICLOFENAC SODIUM 1 % TD GEL: 4.0000 g | Freq: Four times a day (QID) | TRANSDERMAL | 2 refills | Status: DC

## 2018-08-13 MED ORDER — ESOMEPRAZOLE MAGNESIUM 40 MG PO CPDR: 40.0000 mg | DELAYED_RELEASE_CAPSULE | Freq: Every day | ORAL | 3 refills | Status: DC

## 2018-08-13 MED ORDER — PROMETHAZINE HCL 25 MG PO TABS: 25.0000 mg | ORAL_TABLET | Freq: Three times a day (TID) | ORAL | 0 refills | Status: DC | PRN

## 2018-08-13 MED ORDER — HYDROCODONE-ACETAMINOPHEN 5-325 MG PO TABS: 1.0000 | ORAL_TABLET | Freq: Four times a day (QID) | ORAL | 0 refills | Status: DC | PRN

## 2018-08-13 MED ORDER — CYCLOBENZAPRINE HCL 10 MG PO TABS: ORAL_TABLET | ORAL | 2 refills | Status: DC

## 2018-08-13 NOTE — Assessment & Plan Note (Signed)
Tolerating statin, encouraged heart healthy diet, avoid trans fats, minimize simple carbs and saturated fats. Increase exercise as tolerated 

## 2018-08-13 NOTE — Progress Notes (Signed)
Patient ID: Yvonne Sanders, female    DOB: November 21, 1960  Age: 57 y.o. MRN: [id number]    Subjective:  Subjective  HPI Yvonne Sanders presents for bp and cholesterol.   She also needs a refill on her pain meds --- she has no complaints today.    Review of Systems  Constitutional: Negative for chills and fever.  HENT: Negative for congestion and hearing loss.   Eyes: Negative for discharge.  Respiratory: Negative for cough and shortness of breath.   Cardiovascular: Negative for chest pain, palpitations and leg swelling.  Gastrointestinal: Negative for abdominal pain, blood in stool, constipation, diarrhea, nausea and vomiting.  Genitourinary: Negative for dysuria, frequency, hematuria and urgency.  Musculoskeletal: Negative for back pain and myalgias.  Skin: Negative for rash.  Allergic/Immunologic: Negative for environmental allergies.  Neurological: Negative for dizziness, weakness and headaches.  Hematological: Does not bruise/bleed easily.  Psychiatric/Behavioral: Negative for suicidal ideas. The patient is not nervous/anxious.     History Past Medical History:  Diagnosis Date  . Abnormal pap 1989   cryo  . Arthritis   . Chicken pox   . Endometriosis   . 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 Glaucoma   . Headache(784.0)   . Hyperlipidemia   . Hypertension       . Migraine   . Positive TB test     She has a past surgical history that includes Back surgery (99); Cholecystectomy (88); Diagnostic laparoscopy (88); Appendectomy (75); Right oophorectomy (75); Eye surgery (12); Breast biopsy; Lumbar fusion (08/2011); Lumbar fusion; and Breast excisional biopsy (Right).   Her family history includes Breast cancer in her maternal aunt and maternal grandmother; Glaucoma in her father; Heart disease in her father; Hypertension in her father; Kidney disease in her father; Macular degeneration in her father; Prostate cancer in her father; Renal Disease in her father; Stroke in  her father.She reports that she has never smoked. She has never used smokeless tobacco. She reports that she does not drink alcohol or use drugs.  Current Outpatient Medications on File Prior to Visit  Medication Sig Dispense Refill  . aspirin EC 81 MG tablet Take 1 tablet (81 mg total) by mouth daily. 90 tablet 3  . aspirin-acetaminophen-caffeine (EXCEDRIN MIGRAINE) 250-250-65 MG tablet Take 1 tablet by mouth every 6 (six) hours as needed for headache or migraine.    Marland Kitchen azelastine (ASTELIN) 0.1 % nasal spray Place 1 spray into both nostrils 2 (two) times daily. Use in each nostril as directed 30 mL 0  . benzonatate (TESSALON) 100 MG capsule Take 1-2 capsules (100-200 mg total) by mouth 3 (three) times daily as needed for cough. 20 capsule 0  . Biotin 5000 MCG CAPS Take 5,000 mcg by mouth daily.      . Brinzolamide-Brimonidine (SIMBRINZA) 1-0.2 % SUSP Place 1 drop into both eyes 2 (two) times daily.    . Calcium Carbonate Antacid (TUMS PO) Take 1 tablet by mouth daily as needed (heartburn).     . Carboxymethylcellul-Glycerin 0.5-0.9 % SOLN Apply 1 drop to eye daily as needed (eyes).     . Cholecalciferol (VITAMIN D3) 2000 units TABS Take 1 tablet by mouth daily.    Marland Kitchen ezetimibe (ZETIA) 10 MG tablet TAKE 1 TABLET(10 MG) BY MOUTH DAILY 90 tablet 1  . folic acid (FOLVITE) 627 MCG tablet Take 400 mcg by mouth daily.      Marland Kitchen ibuprofen (ADVIL,MOTRIN) 800 MG tablet Take 800 mg by mouth every 8 (eight)  hours as needed. For pain     . Krill Oil 300 MG CAPS Take 1 capsule by mouth daily.     . metoprolol succinate (TOPROL-XL) 50 MG 24 hr tablet Take 1 tablet (50 mg total) by mouth daily. Take with or immediately following a meal. 90 tablet 3  . Multiple Vitamins-Minerals (MULTIVITAMINS THER. W/MINERALS) TABS Take 1 tablet by mouth daily.      . rosuvastatin (CRESTOR) 40 MG tablet TAKE 1 TABLET(40 MG) BY MOUTH DAILY 90 tablet 1  . triamcinolone cream (KENALOG) 0.1 % Apply 1 application topically 2 (two) times  daily. 45 g 2  . Netarsudil-Latanoprost (ROCKLATAN) 0.02-0.005 % SOLN Apply to eye.     No current facility-administered medications on file prior to visit.      Objective:  Objective  Physical Exam Nursing note reviewed. Exam conducted with a chaperone present.  Constitutional:      Appearance: She is well-developed.  HENT:     Head: Normocephalic and atraumatic.  Eyes:     Conjunctiva/sclera: Conjunctivae normal.  Neck:     Musculoskeletal: Normal range of motion and neck supple.     Thyroid: No thyromegaly.     Vascular: No carotid bruit or JVD.  Cardiovascular:     Rate and Rhythm: Normal rate and regular rhythm.     Heart sounds: Normal heart sounds. No murmur.  Pulmonary:     Effort: Pulmonary effort is normal. No respiratory distress.     Breath sounds: Normal breath sounds. No wheezing or rales.  Chest:     Chest wall: No tenderness.  Neurological:     Mental Status: She is alert and oriented to person, place, and time.    BP 124/74 (BP Location: Right Arm, Patient Position: Sitting, Cuff Size: Large)   Pulse 66   Temp 98.8 F (37.1 C) (Oral)   Resp 16   Ht 5\' 5"  (1.651 m)   Wt 206 lb (93.4 kg)   LMP 09/03/2007 (Approximate)   SpO2 100%   BMI 34.28 kg/m  Wt Readings from Last 3 Encounters:  08/13/18 206 lb (93.4 kg)  02/19/18 210 lb 9.6 oz (95.5 kg)  02/04/18 209 lb 12.8 oz (95.2 kg)     Lab Results  Component Value Date   WBC 6.2 02/19/2018   HGB 12.7 02/19/2018   HCT 39.4 02/19/2018   PLT 218.0 02/19/2018   GLUCOSE 86 02/19/2018   CHOL 194 02/19/2018   TRIG 80.0 02/19/2018   HDL 71.30 02/19/2018   LDLCALC 107 (H) 02/19/2018   ALT 40 (H) 02/19/2018   AST 33 02/19/2018   NA 143 02/19/2018   K 4.2 02/19/2018   CL 107 02/19/2018   CREATININE 0.73 02/19/2018   BUN 8 02/19/2018   CO2 26 02/19/2018   TSH 0.53 10/12/2015    Mm Digital Screening Bilateral  Result Date: 04/23/2018 CLINICAL DATA:  Screening. EXAM: DIGITAL SCREENING BILATERAL  MAMMOGRAM WITH CAD COMPARISON:  Previous exam(s). ACR Breast Density Category b: There are scattered areas of fibroglandular density. FINDINGS: There are no findings suspicious for malignancy. Images were processed with CAD. IMPRESSION: No mammographic evidence of malignancy. A result letter of this screening mammogram will be mailed directly to the patient. RECOMMENDATION: Screening mammogram in one year. (Code:SM-B-01Y) BI-RADS CATEGORY  1: Negative. Electronically Signed   By: Dorise Bullion III M.D   On: 04/23/2018 16:16     Assessment & Plan:  Plan  I have discontinued Zyairah B. Gunnels's latanoprost. I have also  changed her esomeprazole. Additionally, I am having her start on promethazine. Lastly, I am having her maintain her multivitamins ther. w/minerals, folic acid, Biotin, ibuprofen, Calcium Carbonate Antacid (TUMS PO), carboxymethylcellul-glycerin, Krill Oil, aspirin-acetaminophen-caffeine, Brinzolamide-Brimonidine, Vitamin D3, benzonatate, azelastine, metoprolol succinate, aspirin EC, triamcinolone cream, rosuvastatin, ezetimibe, Netarsudil-Latanoprost, HYDROcodone-acetaminophen, diclofenac sodium, and cyclobenzaprine.  Meds ordered this encounter  Medications  . HYDROcodone-acetaminophen (NORCO/VICODIN) 5-325 MG tablet    Sig: Take 1 tablet by mouth every 6 (six) hours as needed for moderate pain.    Dispense:  30 tablet    Refill:  0  . esomeprazole (NEXIUM) 40 MG capsule    Sig: Take 1 capsule (40 mg total) by mouth daily.    Dispense:  30 capsule    Refill:  3  . diclofenac sodium (VOLTAREN) 1 % GEL    Sig: Apply 4 g topically 4 (four) times daily.    Dispense:  100 g    Refill:  2  . cyclobenzaprine (FLEXERIL) 10 MG tablet    Sig: take 1 tablet by mouth three times a day if needed for muscle spasm    Dispense:  30 tablet    Refill:  2  . promethazine (PHENERGAN) 25 MG tablet    Sig: Take 1 tablet (25 mg total) by mouth every 8 (eight) hours as needed for nausea or  vomiting.    Dispense:  20 tablet    Refill:  0    Problem List Items Addressed This Visit      Unprioritized   GERD (gastroesophageal reflux disease)   Relevant Medications   esomeprazole (NEXIUM) 40 MG capsule   HTN (hypertension)    Well controlled, no changes to meds. Encouraged heart healthy diet such as the DASH diet and exercise as tolerated.       Relevant Orders   Lipid panel   Comprehensive metabolic panel   Hyperlipidemia - Primary    Tolerating statin, encouraged heart healthy diet, avoid trans fats, minimize simple carbs and saturated fats. Increase exercise as tolerated      Relevant Orders   Lipid panel   Comprehensive metabolic panel    Other Visit Diagnoses    Chronic pain syndrome       Relevant Medications   HYDROcodone-acetaminophen (NORCO/VICODIN) 5-325 MG tablet   cyclobenzaprine (FLEXERIL) 10 MG tablet   Other Relevant Orders   Pain Mgmt, Profile 8 w/Conf, U   Acute pain of right knee       Relevant Medications   diclofenac sodium (VOLTAREN) 1 % GEL   Other Relevant Orders   Pain Mgmt, Profile 8 w/Conf, U      Follow-up: Return in about 6 months (around 02/12/2019) for annual exam, fasting.  Ann Held, DO

## 2018-08-13 NOTE — Assessment & Plan Note (Signed)
Well controlled, no changes to meds. Encouraged heart healthy diet such as the DASH diet and exercise as tolerated.  °

## 2018-08-13 NOTE — Patient Instructions (Signed)

## 2018-08-14 LAB — PAIN MGMT, PROFILE 8 W/CONF, U
6 Acetylmorphine: NEGATIVE ng/mL (ref <10)
Alcohol Metabolites: NEGATIVE ng/mL (ref <500)
Amphetamines: NEGATIVE ng/mL (ref <500)
BENZODIAZEPINES: NEGATIVE ng/mL (ref <100)
Buprenorphine, Urine: NEGATIVE ng/mL (ref <5)
Cocaine Metabolite: NEGATIVE ng/mL (ref <150)
Creatinine: 124.9 mg/dL
MARIJUANA METABOLITE: NEGATIVE ng/mL (ref <20)
MDMA: NEGATIVE ng/mL (ref <500)
Opiates: NEGATIVE ng/mL (ref <100)
Oxidant: NEGATIVE ug/mL (ref <200)
Oxycodone: NEGATIVE ng/mL (ref <100)
pH: 7.64 (ref 4.5–9.0)

## 2018-08-20 DIAGNOSIS — E785 Hyperlipidemia, unspecified: Secondary | ICD-10-CM

## 2018-08-20 MED ORDER — EZETIMIBE 10 MG PO TABS: ORAL_TABLET | ORAL | 1 refills | Status: DC

## 2018-09-07 NOTE — Progress Notes (Signed)
58 y.o. G76P1001 Married [place] American female here for annual exam.    Denies vaginal bleeding.   Wants a pap today.   Had surgery on her left eye for glaucoma.  PCP: Garnet Koyanagi, DO    Patient's last menstrual period was 09/03/2007 (approximate).           Sexually active: Yes.   female The current method of family planning is post menopausal status.    Exercising: No.  The patient does not participate in regular exercise at present. Smoker:  no  Health Maintenance: Pap: 08-07-17 Neg:Neg HR HPV, 07-03-16 Neg:Neg HR HPV History of abnormal Pap:  Yes, Hx [id number], 2012, 2013. Pap ASCUS in 2014, and then normal in 2015 and 2016. MMG:  04-23-18 Neg/density B/BiRads1 Colonoscopy:  NEVER BMD:   n/a  Result  n/a TDaP:  2016 Gardasil:   no HIV: years ago--Neg Hep C: 10-12-15 Neg Screening Labs:  PCP. Flu vaccine:  Done in October.    reports that she has never smoked. She has never used smokeless tobacco. She reports that she does not drink alcohol or use drugs.  Past Medical History:  Diagnosis Date  . Abnormal pap 1989   cryo  . Arthritis   . Chicken pox   . Endometriosis   . 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 Glaucoma   . Headache(784.0)   . Hyperlipidemia   . Hypertension       . Migraine   . Positive TB test   . Tibia fracture    Right leg.      Past Surgical History:  Procedure Laterality Date  . APPENDECTOMY  75  . BACK SURGERY  99   HNP  . BREAST BIOPSY     benign  . BREAST EXCISIONAL BIOPSY Right   . CHOLECYSTECTOMY  88  . DIAGNOSTIC LAPAROSCOPY  88   endometrious  . EYE SURGERY  12   laser for glaucoma  . GLAUCOMA SURGERY Left 2019  . LUMBAR FUSION  08/2011   l 3-4  . LUMBAR FUSION     l 4-5  . RIGHT OOPHORECTOMY  75    Current Outpatient Medications  Medication Sig Dispense Refill  . aspirin EC 81 MG tablet Take 1 tablet (81 mg total) by mouth daily. 90 tablet 3  . aspirin-acetaminophen-caffeine (EXCEDRIN MIGRAINE) 250-250-65 MG  tablet Take 1 tablet by mouth every 6 (six) hours as needed for headache or migraine.    . Biotin 5000 MCG CAPS Take 5,000 mcg by mouth daily.      . Brinzolamide-Brimonidine (SIMBRINZA) 1-0.2 % SUSP Place 1 drop into both eyes 2 (two) times daily.    . Calcium Carbonate Antacid (TUMS PO) Take 1 tablet by mouth daily as needed (heartburn).     . Carboxymethylcellul-Glycerin 0.5-0.9 % SOLN Apply 1 drop to eye daily as needed (eyes).     . Cholecalciferol (VITAMIN D3) 2000 units TABS Take 1 tablet by mouth daily.    . cyclobenzaprine (FLEXERIL) 10 MG tablet take 1 tablet by mouth three times a day if needed for muscle spasm 30 tablet 2  . diclofenac sodium (VOLTAREN) 1 % GEL Apply 4 g topically 4 (four) times daily. 100 g 2  . esomeprazole (NEXIUM) 40 MG capsule Take 1 capsule (40 mg total) by mouth daily. 30 capsule 3  . ezetimibe (ZETIA) 10 MG tablet TAKE 1 TABLET(10 MG) BY MOUTH DAILY 90 tablet 1  . folic acid (FOLVITE) 476 MCG tablet Take  400 mcg by mouth daily.      Marland Kitchen HYDROcodone-acetaminophen (NORCO/VICODIN) 5-325 MG tablet Take 1 tablet by mouth every 6 (six) hours as needed for moderate pain. 30 tablet 0  . ibuprofen (ADVIL,MOTRIN) 800 MG tablet Take 800 mg by mouth every 8 (eight) hours as needed. For pain     . Krill Oil 300 MG CAPS Take 1 capsule by mouth daily.     . metoprolol succinate (TOPROL-XL) 50 MG 24 hr tablet Take 1 tablet (50 mg total) by mouth daily. Take with or immediately following a meal. 90 tablet 3  . Multiple Vitamins-Minerals (MULTIVITAMINS THER. W/MINERALS) TABS Take 1 tablet by mouth daily.      . Netarsudil-Latanoprost (ROCKLATAN) 0.02-0.005 % SOLN Apply to eye.    . promethazine (PHENERGAN) 25 MG tablet Take 1 tablet (25 mg total) by mouth every 8 (eight) hours as needed for nausea or vomiting. 20 tablet 0  . rosuvastatin (CRESTOR) 40 MG tablet TAKE 1 TABLET(40 MG) BY MOUTH DAILY 90 tablet 1  . triamcinolone cream (KENALOG) 0.1 % Apply 1 application topically 2  (two) times daily. 45 g 2   No current facility-administered medications for this visit.     Family History  Problem Relation Age of Onset  . Hypertension Father   . Glaucoma Father   . Prostate cancer Father   . Renal Disease Father   . Macular degeneration Father   . Stroke Father   . Kidney disease Father   . Heart disease Father   . Breast cancer Maternal Grandmother   . Breast cancer Maternal Aunt     Review of Systems  All other systems reviewed and are negative.   Exam:   BP 138/82 (BP Location: Right Arm, Patient Position: Sitting, Cuff Size: Large)   Pulse 82   Resp 14   Ht 5' 4.25" (1.632 m)   Wt 203 lb (92.1 kg)   LMP 09/03/2007 (Approximate)   BMI 34.57 kg/m     General appearance: alert, cooperative and appears stated age Head: Normocephalic, without obvious abnormality, atraumatic Neck: no adenopathy, supple, symmetrical, trachea midline and thyroid normal to inspection and palpation Lungs: clear to auscultation bilaterally Breasts: normal appearance, no masses or tenderness, No nipple retraction or dimpling, No nipple discharge or bleeding, No axillary or supraclavicular adenopathy Heart: regular rate and rhythm Abdomen: soft, non-tender; no masses, no organomegaly Extremities: extremities normal, atraumatic, no cyanosis or edema Skin: Skin color, texture, turgor normal. No rashes or lesions Lymph nodes: Cervical, supraclavicular, and axillary nodes normal. No abnormal inguinal nodes palpated Neurologic: Grossly normal  Pelvic: External genitalia:  no lesions              Urethra:  normal appearing urethra with no masses, tenderness or lesions              Bartholins and Skenes: normal                 Vagina: normal appearing vagina with normal color and discharge, no lesions              Cervix: no lesions.  Cervix somewhat stenotic.               Pap taken: Yes.   Bimanual Exam:  Uterus:  normal size, contour, position, consistency, mobility,  non-tender              Adnexa: no mass, fullness, tenderness  Rectal exam: Yes.  .  Confirms.              Anus:  normal sphincter tone, no lesions  Chaperone was present for exam.  Assessment:   Well woman visit with normal exam. Hs LGSIL. Hx Cryotherapy.  ? Hx LEEP. Glaucoma.  Plan: Mammogram screening. Recommended self breast awareness. Pap and HR HPV as above. Guidelines for Calcium, Vitamin D, regular exercise program including cardiovascular and weight bearing exercise. Colonoscopy recommended.  She will consider Cologuard.  Follow up annually and prn.   After visit summary provided.

## 2018-09-09 ENCOUNTER — Other Ambulatory Visit (HOSPITAL_COMMUNITY)
Admission: RE | Admit: 2018-09-09 | Discharge: 2018-09-09 | Disposition: A | Discharge status: Home / Self Care | Payer: PRIVATE HEALTH INSURANCE | Source: Ambulatory Visit | Attending: Obstetrics and Gynecology | Admitting: Obstetrics and Gynecology

## 2018-09-09 ENCOUNTER — Other Ambulatory Visit: Payer: Self-pay

## 2018-09-09 ENCOUNTER — Encounter: Payer: Self-pay | Admitting: Obstetrics and Gynecology

## 2018-09-09 ENCOUNTER — Ambulatory Visit (INDEPENDENT_AMBULATORY_CARE_PROVIDER_SITE_OTHER): Payer: PRIVATE HEALTH INSURANCE | Admitting: Obstetrics and Gynecology

## 2018-09-09 VITALS — BP 138/82 | HR 82 | Resp 14 | Ht 64.25 in | Wt 203.0 lb

## 2018-09-09 DIAGNOSIS — Z01419 Encounter for gynecological examination (general) (routine) without abnormal findings: Secondary | ICD-10-CM | POA: Insufficient documentation

## 2018-09-09 NOTE — Patient Instructions (Signed)

## 2018-09-11 LAB — CYTOLOGY - PAP
Adequacy: ABSENT
Diagnosis: NEGATIVE
HPV: NOT DETECTED

## 2019-02-25 ENCOUNTER — Other Ambulatory Visit: Payer: Self-pay

## 2019-02-25 ENCOUNTER — Ambulatory Visit (INDEPENDENT_AMBULATORY_CARE_PROVIDER_SITE_OTHER): Payer: PRIVATE HEALTH INSURANCE | Admitting: Family Medicine

## 2019-02-25 ENCOUNTER — Encounter: Payer: Self-pay | Admitting: Family Medicine

## 2019-02-25 VITALS — Ht 64.0 in | Wt 199.6 lb

## 2019-02-25 DIAGNOSIS — I10 Essential (primary) hypertension: Secondary | ICD-10-CM

## 2019-02-25 DIAGNOSIS — Z0000 Encounter for general adult medical examination without abnormal findings: Secondary | ICD-10-CM | POA: Diagnosis not present

## 2019-02-25 DIAGNOSIS — E785 Hyperlipidemia, unspecified: Secondary | ICD-10-CM | POA: Diagnosis not present

## 2019-02-25 DIAGNOSIS — Z23 Encounter for immunization: Secondary | ICD-10-CM

## 2019-02-25 DIAGNOSIS — G894 Chronic pain syndrome: Secondary | ICD-10-CM

## 2019-02-25 LAB — COMPREHENSIVE METABOLIC PANEL
ALT: 39 U/L — ABNORMAL HIGH (ref 0–35)
AST: 29 U/L (ref 0–37)
Albumin: 4.4 g/dL (ref 3.5–5.2)
Alkaline Phosphatase: 74 U/L (ref 39–117)
BUN: 10 mg/dL (ref 6–23)
CO2: 26 mEq/L (ref 19–32)
Calcium: 9.5 mg/dL (ref 8.4–10.5)
Chloride: 106 mEq/L (ref 96–112)
Creatinine, Ser: 0.62 mg/dL (ref 0.40–1.20)
GFR: 119.72 mL/min (ref >60.00)
Glucose, Bld: 95 mg/dL (ref 70–99)
Potassium: 4 mEq/L (ref 3.5–5.1)
Sodium: 141 mEq/L (ref 135–145)
Total Bilirubin: 0.6 mg/dL (ref 0.2–1.2)
Total Protein: 7.2 g/dL (ref 6.0–8.3)

## 2019-02-25 LAB — LIPID PANEL
Cholesterol: 208 mg/dL — ABNORMAL HIGH (ref 0–200)
HDL: 67.4 mg/dL (ref >39.00)
LDL Cholesterol: 127 mg/dL — ABNORMAL HIGH (ref 0–99)
NonHDL: 140.77
Total CHOL/HDL Ratio: 3
Triglycerides: 67 mg/dL (ref 0.0–149.0)
VLDL: 13.4 mg/dL (ref 0.0–40.0)

## 2019-02-25 LAB — CBC WITH DIFFERENTIAL/PLATELET
Basophils Absolute: 0 10*3/uL (ref 0.0–0.1)
Basophils Relative: 0.5 % (ref 0.0–3.0)
Eosinophils Absolute: 0 10*3/uL (ref 0.0–0.7)
Eosinophils Relative: 0.9 % (ref 0.0–5.0)
HCT: 37.9 % (ref 36.0–46.0)
Hemoglobin: 12.1 g/dL (ref 12.0–15.0)
Lymphocytes Relative: 32.1 % (ref 12.0–46.0)
Lymphs Abs: 1.6 10*3/uL (ref 0.7–4.0)
MCHC: 31.8 g/dL (ref 30.0–36.0)
MCV: 85.6 fl (ref 78.0–100.0)
Monocytes Absolute: 0.4 10*3/uL (ref 0.1–1.0)
Monocytes Relative: 8.4 % (ref 3.0–12.0)
Neutro Abs: 2.8 10*3/uL (ref 1.4–7.7)
Neutrophils Relative %: 58.1 % (ref 43.0–77.0)
Platelets: 180 10*3/uL (ref 150.0–400.0)
RBC: 4.43 Mil/uL (ref 3.87–5.11)
RDW: 13.1 % (ref 11.5–15.5)
WBC: 4.9 10*3/uL (ref 4.0–10.5)

## 2019-02-25 LAB — TSH: TSH: 1.14 u[IU]/mL (ref 0.35–4.50)

## 2019-02-25 MED ORDER — ROSUVASTATIN CALCIUM 40 MG PO TABS: ORAL_TABLET | ORAL | 1 refills | Status: DC

## 2019-02-25 MED ORDER — HYDROCODONE-ACETAMINOPHEN 5-325 MG PO TABS: 1.0000 | ORAL_TABLET | Freq: Four times a day (QID) | ORAL | 0 refills | Status: DC | PRN

## 2019-02-25 MED ORDER — CYCLOBENZAPRINE HCL 10 MG PO TABS: ORAL_TABLET | ORAL | 2 refills | Status: DC

## 2019-02-25 MED ORDER — EZETIMIBE 10 MG PO TABS: ORAL_TABLET | ORAL | 1 refills | Status: DC

## 2019-02-25 NOTE — Patient Instructions (Signed)
Preventive Care 40-64 Years, Female Preventive care refers to lifestyle choices and visits with your health care provider that can promote health and wellness. What does preventive care include?   A yearly physical exam. This is also called an annual well check.  Dental exams once or twice a year.  Routine eye exams. Ask your health care provider how often you should have your eyes checked.  Personal lifestyle choices, including: ? Daily care of your teeth and gums. ? Regular physical activity. ? Eating a healthy diet. ? Avoiding tobacco and drug use. ? Limiting alcohol use. ? Practicing safe sex. ? Taking low-dose aspirin daily starting at age 58. ? Taking vitamin and mineral supplements as recommended by your health care provider. What happens during an annual well check? The services and screenings done by your health care provider during your annual well check will depend on your age, overall health, lifestyle risk factors, and family history of disease. Counseling Your health care provider may ask you questions about your:  Alcohol use.  Tobacco use.  Drug use.  Emotional well-being.  Home and relationship well-being.  Sexual activity.  Eating habits.  Work and work environment.  Method of birth control.  Menstrual cycle.  Pregnancy history. Screening You may have the following tests or measurements:  Height, weight, and BMI.  Blood pressure.  Lipid and cholesterol levels. These may be checked every 5 years, or more frequently if you are over 58 years old.  Skin check.  Lung cancer screening. You may have this screening every year starting at age 58 if you have a 30-pack-year history of smoking and currently smoke or have quit within the past 15 years.  Colorectal cancer screening. All adults should have this screening starting at age 58 and continuing until age 75. Your health care provider may recommend screening at age 45. You will have tests every  1-10 years, depending on your results and the type of screening test. People at increased risk should start screening at an earlier age. Screening tests may include: ? Guaiac-based fecal occult blood testing. ? Fecal immunochemical test (FIT). ? Stool DNA test. ? Virtual colonoscopy. ? Sigmoidoscopy. During this test, a flexible tube with a tiny camera (sigmoidoscope) is used to examine your rectum and lower colon. The sigmoidoscope is inserted through your anus into your rectum and lower colon. ? Colonoscopy. During this test, a long, thin, flexible tube with a tiny camera (colonoscope) is used to examine your entire colon and rectum.  Hepatitis C blood test.  Hepatitis B blood test.  Sexually transmitted disease (STD) testing.  Diabetes screening. This is done by checking your blood sugar (glucose) after you have not eaten for a while (fasting). You may have this done every 1-3 years.  Mammogram. This may be done every 1-2 years. Talk to your health care provider about when you should start having regular mammograms. This may depend on whether you have a family history of breast cancer.  BRCA-related cancer screening. This may be done if you have a family history of breast, ovarian, tubal, or peritoneal cancers.  Pelvic exam and Pap test. This may be done every 3 years starting at age 58. Starting at age 30, this may be done every 5 years if you have a Pap test in combination with an HPV test.  Bone density scan. This is done to screen for osteoporosis. You may have this scan if you are at high risk for osteoporosis. Discuss your test results, treatment options,   and if necessary, the need for more tests with your health care provider. Vaccines Your health care provider may recommend certain vaccines, such as:  Influenza vaccine. This is recommended every year.  Tetanus, diphtheria, and acellular pertussis (Tdap, Td) vaccine. You may need a Td booster every 10 years.  Varicella  vaccine. You may need this if you have not been vaccinated.  Zoster vaccine. You may need this after age 58.  Measles, mumps, and rubella (MMR) vaccine. You may need at least one dose of MMR if you were born in 1957 or later. You may also need a second dose.  Pneumococcal 13-valent conjugate (PCV13) vaccine. You may need this if you have certain conditions and were not previously vaccinated.  Pneumococcal polysaccharide (PPSV23) vaccine. You may need one or two doses if you smoke cigarettes or if you have certain conditions.  Meningococcal vaccine. You may need this if you have certain conditions.  Hepatitis A vaccine. You may need this if you have certain conditions or if you travel or work in places where you may be exposed to hepatitis A.  Hepatitis B vaccine. You may need this if you have certain conditions or if you travel or work in places where you may be exposed to hepatitis B.  Haemophilus influenzae type b (Hib) vaccine. You may need this if you have certain conditions. Talk to your health care provider about which screenings and vaccines you need and how often you need them. This information is not intended to replace advice given to you by your health care provider. Make sure you discuss any questions you have with your health care provider. Document Released: 09/15/2015 Document Revised: 10/09/2017 Document Reviewed: 06/20/2015 Elsevier Interactive Patient Education  2019 [employer].

## 2019-02-25 NOTE — Progress Notes (Signed)
Subjective:      Yvonne Sanders is a 58 y.o. female and is here for a comprehensive physical exam. The patient reports no problems.  Social History   Socioeconomic History  . Marital status: Married    Spouse name: Not on file  . Number of children: Not on file  . Years of education: Not on file  . Highest education level: Not on file  Occupational History  . Not on file  Social Needs  . Financial resource strain: Not on file  . Food insecurity    Worry: Not on file    Inability: Not on file  . Transportation needs    Medical: Not on file    Non-medical: Not on file  Tobacco Use  . Smoking status: Never Smoker  . Smokeless tobacco: Never Used  Substance and Sexual Activity  . Alcohol use: No    Comment: occ. wine  . Drug use: No  . Sexual activity: Yes    Partners: Male    Birth control/protection: Post-menopausal  Lifestyle  . Physical activity    Days per week: Not on file    Minutes per session: Not on file  . Stress: Not on file  Relationships  . Social Herbalist on phone: Not on file    Gets together: Not on file    Attends religious service: Not on file    Active member of club or organization: Not on file    Attends meetings of clubs or organizations: Not on file    Relationship status: Not on file  . Intimate partner violence    Fear of current or ex partner: Not on file    Emotionally abused: Not on file    Physically abused: Not on file    Forced sexual activity: Not on file  Other Topics Concern  . Not on file  Social History Narrative  . Not on file   Health Maintenance  Topic Date Due  . HIV Screening  05/20/1976  . COLONOSCOPY  05/21/2011  . INFLUENZA VACCINE  04/03/2019  . MAMMOGRAM  04/23/2020  . PAP SMEAR-Modifier  09/09/2021  . TETANUS/TDAP  09/22/2024  . Hepatitis C Screening  Completed    The following portions of the patient's history were reviewed and updated as appropriate:  She  has a past medical history of  Abnormal pap (1989), Arthritis, Chicken pox, Endometriosis, Fibroid (1994), Glaucoma, Headache(784.0), Hyperlipidemia, Hypertension, Migraine, Positive TB test, and Tibia fracture. She does not have any pertinent problems on file. She  has a past surgical history that includes Back surgery (99); Cholecystectomy (88); Diagnostic laparoscopy (88); Appendectomy (75); Right oophorectomy (75); Eye surgery (12); Breast biopsy; Lumbar fusion (08/2011); Lumbar fusion; Breast excisional biopsy (Right); and Glaucoma surgery (Left, 2019). Her family history includes Breast cancer in her maternal aunt and maternal grandmother; Glaucoma in her father; Heart disease in her father; Hypertension in her father; Kidney disease in her father; Macular degeneration in her father; Prostate cancer in her father; Renal Disease in her father; Stroke in her father. She  reports that she has never smoked. She has never used smokeless tobacco. She reports that she does not drink alcohol or use drugs. She has a current medication list which includes the following prescription(s): aspirin ec, aspirin-acetaminophen-caffeine, biotin, brinzolamide-brimonidine, calcium carbonate antacid, carboxymethylcellul-glycerin, vitamin d3, cyclobenzaprine, diclofenac sodium, esomeprazole, ezetimibe, folic acid, hydrocodone-acetaminophen, ibuprofen, krill oil, metoprolol succinate, multivitamins ther. w/minerals, netarsudil-latanoprost, rosuvastatin, and triamcinolone cream. Current Outpatient Medications on File Prior  to Visit  Medication Sig Dispense Refill  . aspirin EC 81 MG tablet Take 1 tablet (81 mg total) by mouth daily. 90 tablet 3  . aspirin-acetaminophen-caffeine (EXCEDRIN MIGRAINE) 250-250-65 MG tablet Take 1 tablet by mouth every 6 (six) hours as needed for headache or migraine.    . Biotin 5000 MCG CAPS Take 5,000 mcg by mouth daily.      . Brinzolamide-Brimonidine (SIMBRINZA) 1-0.2 % SUSP Place 1 drop into both eyes 2 (two) times  daily.    . Calcium Carbonate Antacid (TUMS PO) Take 1 tablet by mouth daily as needed (heartburn).     . Carboxymethylcellul-Glycerin 0.5-0.9 % SOLN Apply 1 drop to eye daily as needed (eyes).     . Cholecalciferol (VITAMIN D3) 2000 units TABS Take 1 tablet by mouth daily.    . cyclobenzaprine (FLEXERIL) 10 MG tablet take 1 tablet by mouth three times a day if needed for muscle spasm 30 tablet 2  . diclofenac sodium (VOLTAREN) 1 % GEL Apply 4 g topically 4 (four) times daily. 100 g 2  . esomeprazole (NEXIUM) 40 MG capsule Take 1 capsule (40 mg total) by mouth daily. 30 capsule 3  . folic acid (FOLVITE) 409 MCG tablet Take 400 mcg by mouth daily.      Marland Kitchen HYDROcodone-acetaminophen (NORCO/VICODIN) 5-325 MG tablet Take 1 tablet by mouth every 6 (six) hours as needed for moderate pain. 30 tablet 0  . ibuprofen (ADVIL,MOTRIN) 800 MG tablet Take 800 mg by mouth every 8 (eight) hours as needed. For pain     . Krill Oil 300 MG CAPS Take 1 capsule by mouth daily.     . metoprolol succinate (TOPROL-XL) 50 MG 24 hr tablet Take 1 tablet (50 mg total) by mouth daily. Take with or immediately following a meal. 90 tablet 3  . Multiple Vitamins-Minerals (MULTIVITAMINS THER. W/MINERALS) TABS Take 1 tablet by mouth daily.      . Netarsudil-Latanoprost (ROCKLATAN) 0.02-0.005 % SOLN Apply to eye.    . triamcinolone cream (KENALOG) 0.1 % Apply 1 application topically 2 (two) times daily. 45 g 2   No current facility-administered medications on file prior to visit.    She is allergic to nubain [nalbuphine hcl]; corticosteroids; dilaudid [hydromorphone hcl]; nubain [nalbuphine hcl]; oxycodone-acetaminophen; percodan [oxycodone-aspirin]; and tomato..  Review of Systems Review of Systems  Constitutional: Negative for activity change, appetite change and fatigue.  HENT: Negative for hearing loss, congestion, tinnitus and ear discharge.  dentist q55m Eyes: Negative for visual disturbance (see optho q1y -- vision  corrected to 20/20 with glasses).  Respiratory: Negative for cough, chest tightness and shortness of breath.   Cardiovascular: Negative for chest pain, palpitations and leg swelling.  Gastrointestinal: Negative for abdominal pain, diarrhea, constipation and abdominal distention.  Genitourinary: Negative for urgency, frequency, decreased urine volume and difficulty urinating.  Musculoskeletal: Negative for back pain, arthralgias and gait problem.  Skin: Negative for color change, pallor and rash.  Neurological: Negative for dizziness, light-headedness, numbness and headaches.  Hematological: Negative for adenopathy. Does not bruise/bleed easily.  Psychiatric/Behavioral: Negative for suicidal ideas, confusion, sleep disturbance, self-injury, dysphoric mood, decreased concentration and agitation.       Objective:    Ht 5\' 4"  (1.626 m)   Wt 199 lb 9.6 oz (90.5 kg)   LMP 09/03/2007 (Approximate)   BMI 34.26 kg/m  General appearance: alert, cooperative, appears stated age and no distress Head: Normocephalic, without obvious abnormality, atraumatic Eyes: conjunctivae/corneas clear. PERRL, EOM's intact. Fundi benign. Ears: normal TM's  and external ear canals both ears Nose: Nares normal. Septum midline. Mucosa normal. No drainage or sinus tenderness. Throat: lips, mucosa, and tongue normal; teeth and gums normal Neck: no adenopathy, no carotid bruit, no JVD, supple, symmetrical, trachea midline and thyroid not enlarged, symmetric, no tenderness/mass/nodules Back: symmetric, no curvature. ROM normal. No CVA tenderness. Lungs: clear to auscultation bilaterally Breasts: gyn Heart: regular rate and rhythm, S1, S2 normal, no murmur, click, rub or gallop Abdomen: soft, non-tender; bowel sounds normal; no masses,  no organomegaly Pelvic: deferred --gynExtremities: extremities normal, atraumatic, no cyanosis or edema Pulses: 2+ and symmetric Skin: Skin color, texture, turgor normal. No rashes or  lesions Lymph nodes: Cervical, supraclavicular, and axillary nodes normal. Neurologic: Alert and oriented X 3, normal strength and tone. Normal symmetric reflexes. Normal coordination and gait    Assessment:    Healthy female exam.      Plan:    ghm utd-- colon ordered  See After Visit Summary for Counseling Recommendations    1. Hyperlipidemia, unspecified hyperlipidemia type Encouraged heart healthy diet, increase exercise, avoid trans fats, consider a krill oil cap daily - ezetimibe (ZETIA) 10 MG tablet; TAKE 1 TABLET(10 MG) BY MOUTH DAILY  Dispense: 90 tablet; Refill: 1 - rosuvastatin (CRESTOR) 40 MG tablet; TAKE 1 TABLET(40 MG) BY MOUTH DAILY  Dispense: 90 tablet; Refill: 1 - Lipid panel - Comprehensive metabolic panel  2. Preventative health care See above - CBC with Differential/Platelet - Lipid panel - TSH - Comprehensive metabolic panel - Ambulatory referral to Gastroenterology  3. Essential hypertension Well controlled, no changes to meds. Encouraged heart healthy diet such as the DASH diet and exercise as tolerated.   - CBC with Differential/Platelet  4. Chronic pain syndrome Stable con't meds uds utd New contract signed  - cyclobenzaprine (FLEXERIL) 10 MG tablet; take 1 tablet by mouth three times a day if needed for muscle spasm  Dispense: 30 tablet; Refill: 2 - HYDROcodone-acetaminophen (NORCO/VICODIN) 5-325 MG tablet; Take 1 tablet by mouth every 6 (six) hours as needed for moderate pain.  Dispense: 30 tablet; Refill: 0

## 2019-02-26 ENCOUNTER — Telehealth: Payer: Self-pay | Admitting: Family Medicine

## 2019-02-26 NOTE — Telephone Encounter (Signed)
LVM for pt to call office and schedule Return in about 6 months (around 08/27/2019.

## 2019-04-22 IMAGING — DX DG KNEE COMPLETE 4+V*R*
4 series · 4 of 4 positions shown · non-contrast
Comparison: None in PACs

CLINICAL DATA: Right knee pain pressure and swelling for the past
2-3 months following a twisting injury while playing soccer. History
of right-sided patellar fracture.

EXAM:
RIGHT KNEE - COMPLETE 4+ VIEW

[knee ap]
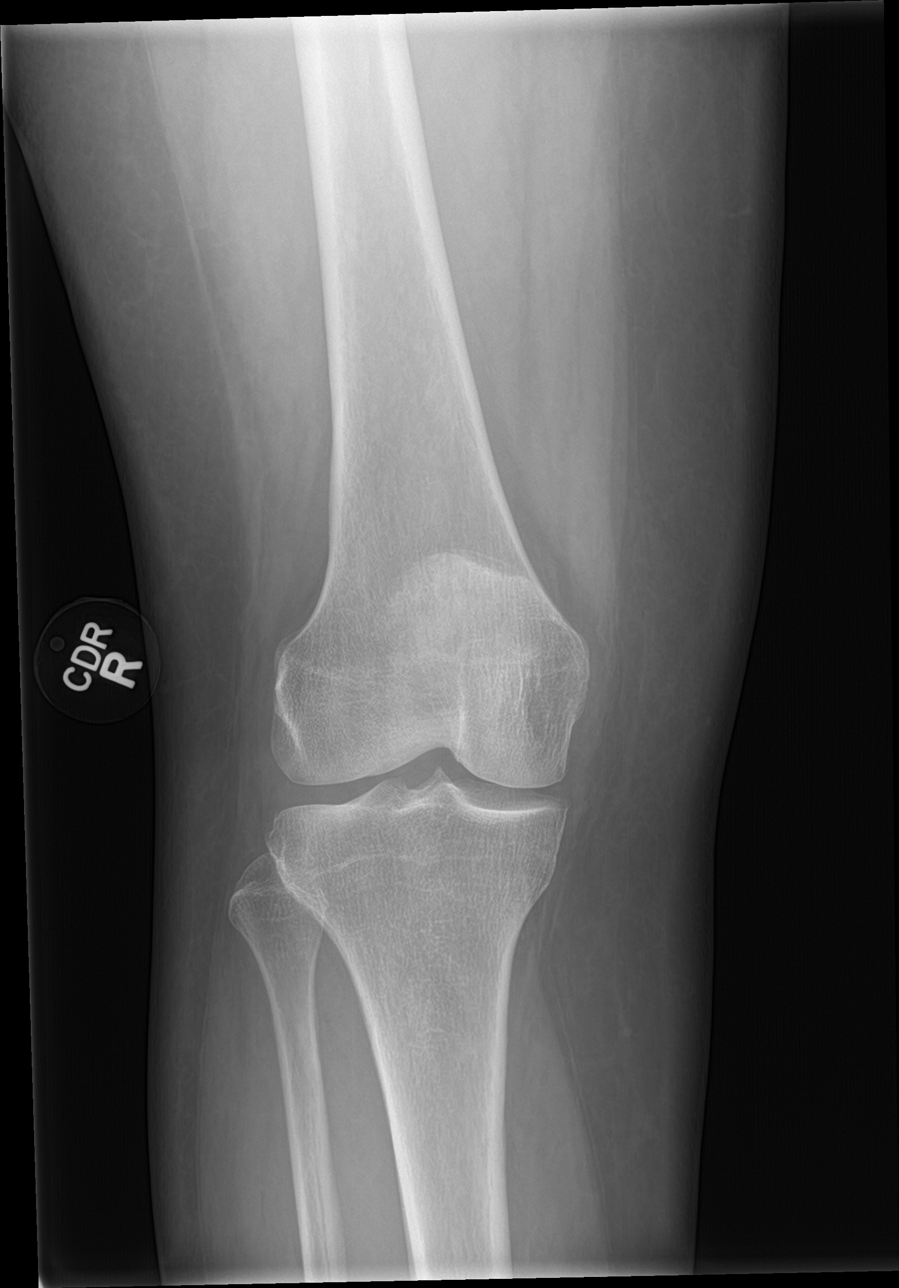

[knee lat]
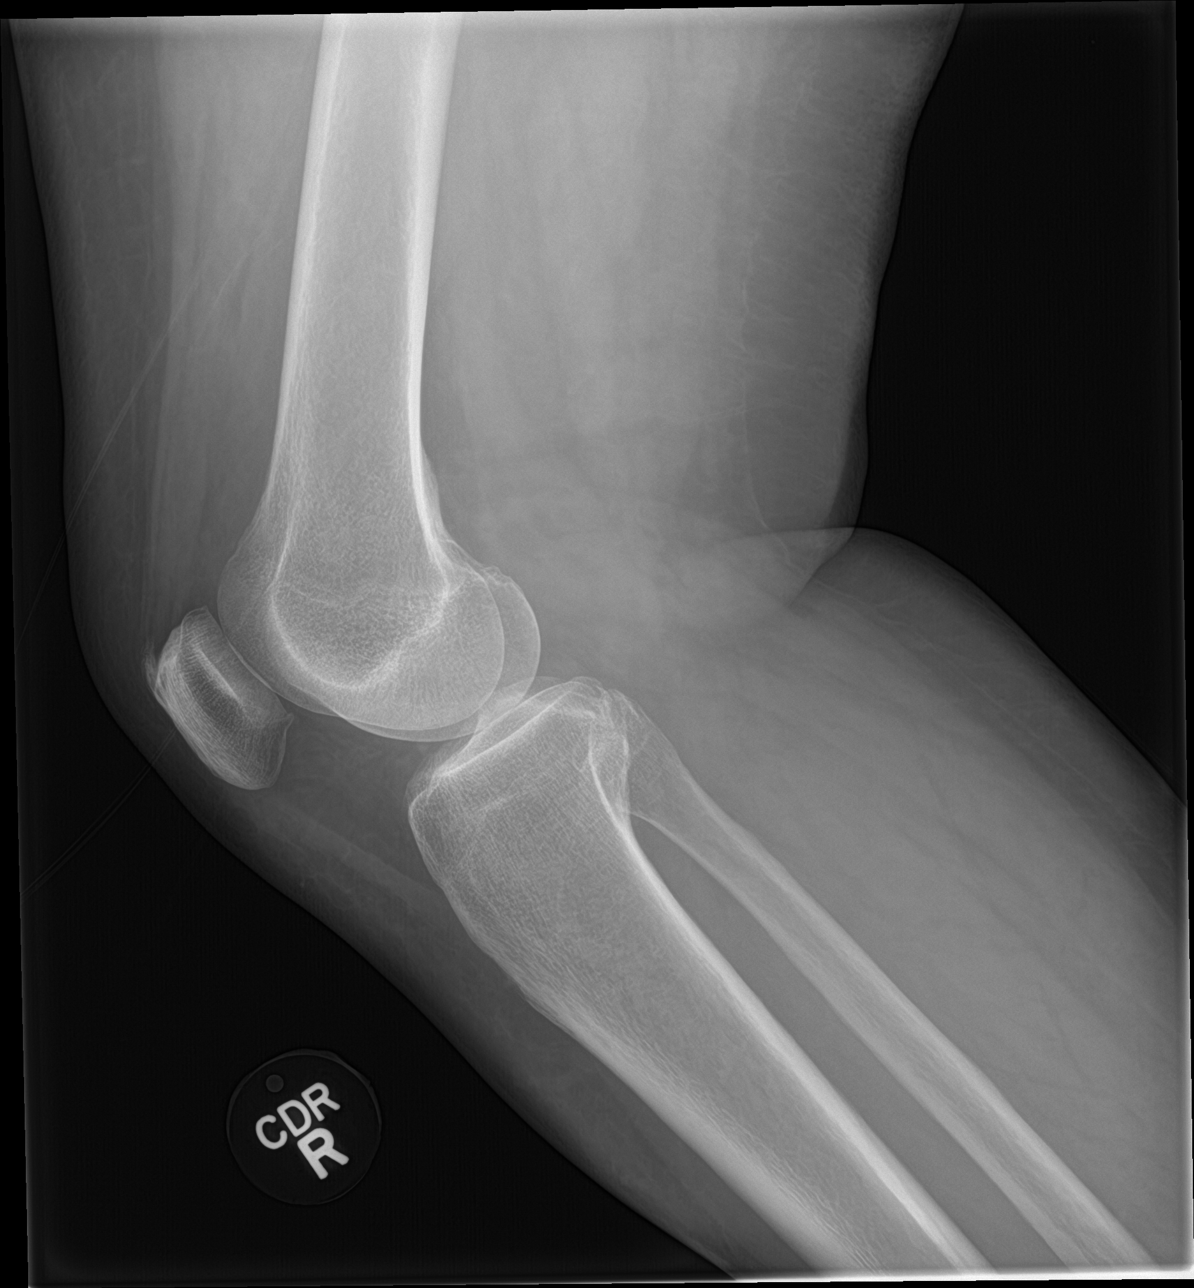

[knee obl (1 of 2)]
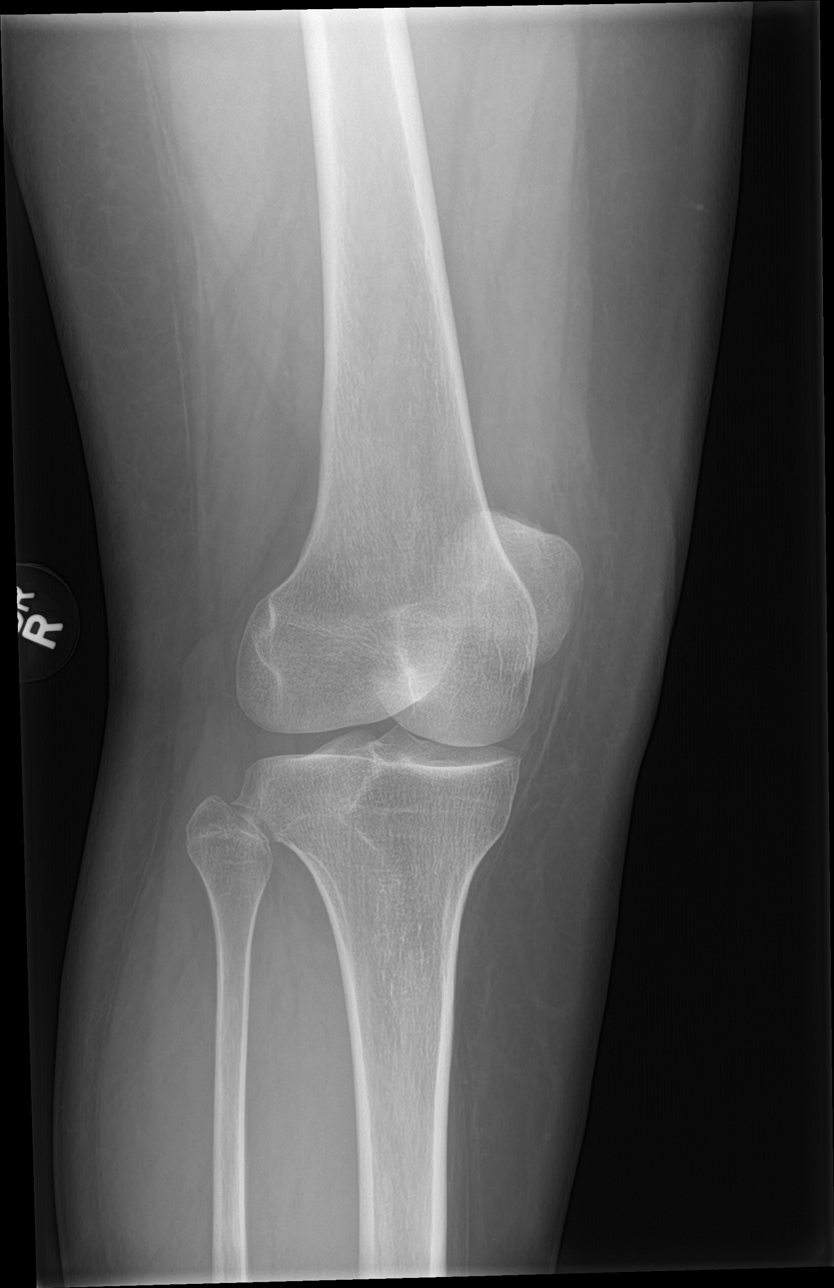

[knee obl (2 of 2)]
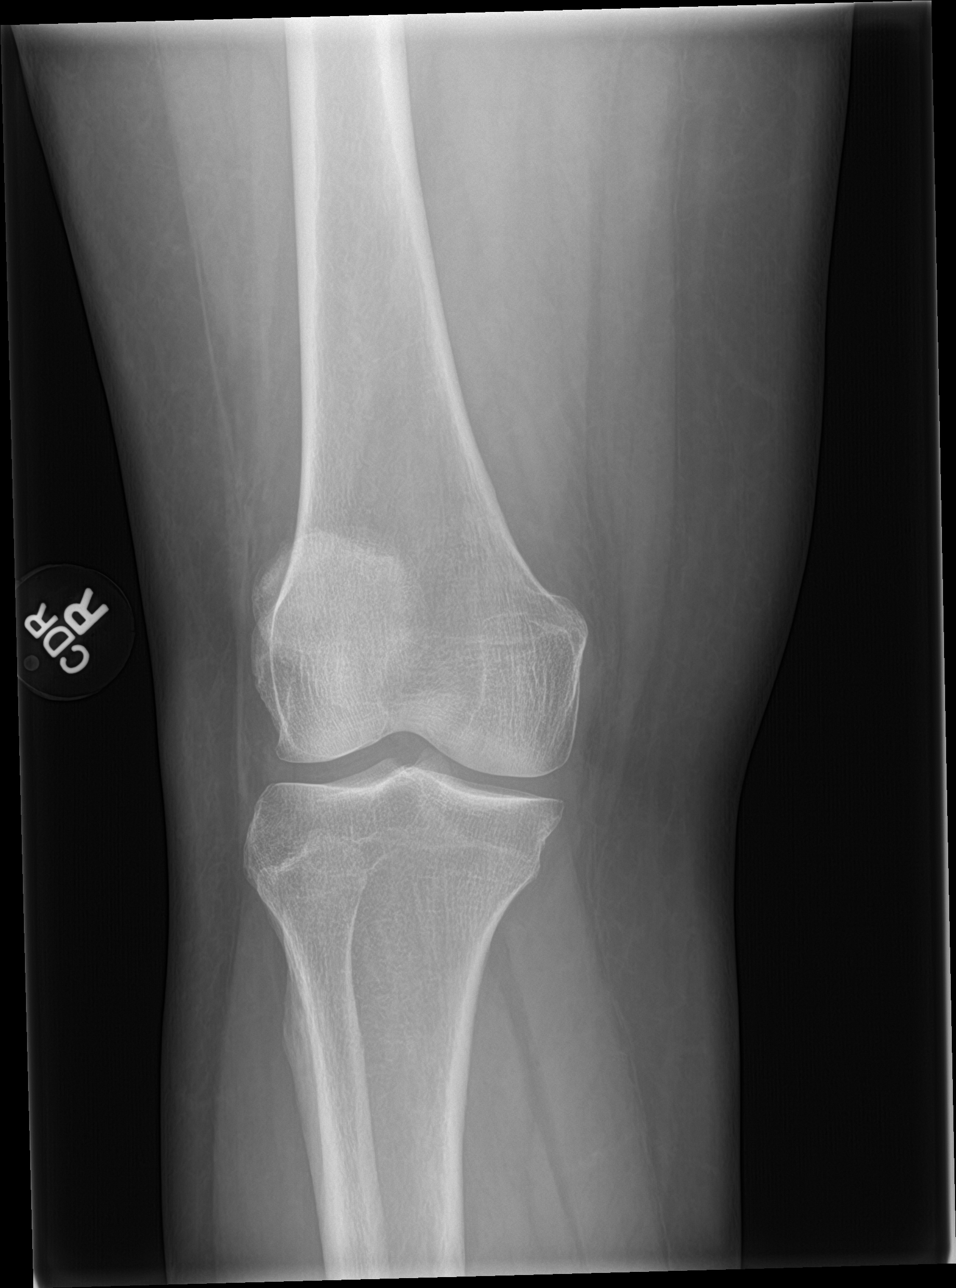

[4 of 4 positions shown; findings below may reference images not displayed]

FINDINGS: The bones are subjectively adequately mineralized. The joint spaces
are well maintained. There is mild beaking of the medial tibial
spine. Tiny spurs arise from the superior and inferior articular
margins of the patella. No joint effusion is observed. There is no
acute or healing fracture or dislocation.
IMPRESSION: Minimal degenerative spurring of the medial tibial spine and of the
patella. No acute bony abnormality.

## 2019-04-27 ENCOUNTER — Other Ambulatory Visit: Payer: Self-pay | Admitting: Interventional Cardiology

## 2019-04-28 ENCOUNTER — Ambulatory Visit (INDEPENDENT_AMBULATORY_CARE_PROVIDER_SITE_OTHER): Payer: PRIVATE HEALTH INSURANCE

## 2019-04-28 ENCOUNTER — Other Ambulatory Visit: Payer: Self-pay

## 2019-04-28 DIAGNOSIS — Z23 Encounter for immunization: Secondary | ICD-10-CM | POA: Diagnosis not present

## 2019-04-28 NOTE — Progress Notes (Signed)
Patient was here today for second shingrix injection, patient tolerated w/o any difficulties.

## 2019-04-29 ENCOUNTER — Ambulatory Visit: Payer: PRIVATE HEALTH INSURANCE

## 2019-06-12 NOTE — Progress Notes (Signed)
Cardiology Office Note:    Date:  06/18/2019   ID:  [name], DOB 01-Jul-1961, MRN [id number]  PCP:  Carollee Herter, Alferd Apa, DO  Cardiologist:  Sinclair Grooms, MD   Referring MD: Carollee Herter, Alferd Apa, *   Chief Complaint  Patient presents with   Hypertension   Hyperlipidemia   Advice Only    Family history CAD/kidney failure    History of Present Illness:    Yvonne Sanders is a 58 y.o. female with a hx of aortic valve sclerosis/mild stenosis, essential hypertension, and LVH.  Darrol Poke is doing well.  She is not having angina.  She denies dyspnea.  No lower extremity swelling.  She has not had neurological complaints.  She has no medication side effects.  No palpitations or syncope.  Overall her contact with CV specialty is primary prevention.  She does not smoke.  She is not diabetic but is at risk.  Past Medical History:  Diagnosis Date   Abnormal pap 1989   cryo   Arthritis    Chicken pox    Endometriosis    Fibroid 1994   History of post uterine   Glaucoma    Headache(784.0)    Hyperlipidemia    Hypertension        Migraine    Positive TB test    Tibia fracture    Right leg.      Past Surgical History:  Procedure Laterality Date   APPENDECTOMY  53   BACK SURGERY  99   HNP   BREAST BIOPSY     benign   BREAST EXCISIONAL BIOPSY Right    CHOLECYSTECTOMY  88   DIAGNOSTIC LAPAROSCOPY  88   endometrious   EYE SURGERY  12   laser for glaucoma   GLAUCOMA SURGERY Left 2019   LUMBAR FUSION  08/2011   l 3-4   LUMBAR FUSION     l 4-5   RIGHT OOPHORECTOMY  75    Current Medications: Current Meds  Medication Sig   aspirin EC 81 MG tablet Take 1 tablet (81 mg total) by mouth daily.   aspirin-acetaminophen-caffeine (EXCEDRIN MIGRAINE) 250-250-65 MG tablet Take 1 tablet by mouth every 6 (six) hours as needed for headache or migraine.   Biotin 5000 MCG CAPS Take 5,000 mcg by mouth daily.     Brinzolamide-Brimonidine  (SIMBRINZA) 1-0.2 % SUSP Place 1 drop into both eyes 2 (two) times daily.   Calcium Carbonate Antacid (TUMS PO) Take 1 tablet by mouth daily as needed (heartburn).    Carboxymethylcellul-Glycerin 0.5-0.9 % SOLN Apply 1 drop to eye daily as needed (eyes).    Cholecalciferol (VITAMIN D3) 2000 units TABS Take 1 tablet by mouth daily.   cyclobenzaprine (FLEXERIL) 10 MG tablet take 1 tablet by mouth three times a day if needed for muscle spasm   diclofenac sodium (VOLTAREN) 1 % GEL Apply 4 g topically 4 (four) times daily.   esomeprazole (NEXIUM) 40 MG capsule Take 1 capsule (40 mg total) by mouth daily.   ezetimibe (ZETIA) 10 MG tablet TAKE 1 TABLET(10 MG) BY MOUTH DAILY   folic acid (FOLVITE) Q000111Q MCG tablet Take 400 mcg by mouth daily.     HYDROcodone-acetaminophen (NORCO/VICODIN) 5-325 MG tablet Take 1 tablet by mouth every 6 (six) hours as needed for moderate pain.   ibuprofen (ADVIL,MOTRIN) 800 MG tablet Take 800 mg by mouth every 8 (eight) hours as needed. For pain    Krill Oil 300 MG CAPS Take  1 capsule by mouth daily.    metoprolol succinate (TOPROL-XL) 50 MG 24 hr tablet TAKE 1 TABLET BY MOUTH EVERY DAY WITH OR IMMEDIATELY FOLLOWING A MEAL   Multiple Vitamins-Minerals (MULTIVITAMINS THER. W/MINERALS) TABS Take 1 tablet by mouth daily.     Netarsudil-Latanoprost (ROCKLATAN) 0.02-0.005 % SOLN Apply to eye.   rosuvastatin (CRESTOR) 40 MG tablet TAKE 1 TABLET(40 MG) BY MOUTH DAILY   triamcinolone cream (KENALOG) 0.1 % Apply 1 application topically 2 (two) times daily.     Allergies:   Nubain [nalbuphine hcl], Corticosteroids, Dilaudid [hydromorphone hcl], Nubain [nalbuphine hcl], Oxycodone-acetaminophen, Percodan [oxycodone-aspirin], and Tomato   Social History   Socioeconomic History   Marital status: Married    Spouse name: Not on file   Number of children: Not on file   Years of education: Not on file   Highest education level: Not on file  Occupational History    Not on file  Social Needs   Financial resource strain: Not on file   Food insecurity    Worry: Not on file    Inability: Not on file   Transportation needs    Medical: Not on file    Non-medical: Not on file  Tobacco Use   Smoking status: Never Smoker   Smokeless tobacco: Never Used  Substance and Sexual Activity   Alcohol use: No    Comment: occ. wine   Drug use: No   Sexual activity: Yes    Partners: Male    Birth control/protection: Post-menopausal  Lifestyle   Physical activity    Days per week: Not on file    Minutes per session: Not on file   Stress: Not on file  Relationships   Social connections    Talks on phone: Not on file    Gets together: Not on file    Attends religious service: Not on file    Active member of club or organization: Not on file    Attends meetings of clubs or organizations: Not on file    Relationship status: Not on file  Other Topics Concern   Not on file  Social History Narrative   Not on file     Family History: The patient's family history includes Breast cancer in her maternal aunt and maternal grandmother; Glaucoma in her father; Heart disease in her father; Hypertension in her father; Kidney disease in her father; Macular degeneration in her father; Prostate cancer in her father; Renal Disease in her father; Stroke in her father.  ROS:   Please see the history of present illness.    Chronic low back discomfort is progressively decreasing physical activity.  All other systems reviewed and are negative.  EKGs/Labs/Other Studies Reviewed:    The following studies were reviewed today: No cardiac data such as CT imaging  EKG:  EKG normal sinus rhythm with normal appearance.  Recent Labs: 02/25/2019: ALT 39; BUN 10; Creatinine, Ser 0.62; Hemoglobin 12.1; Platelets 180.0; Potassium 4.0; Sodium 141; TSH 1.14  Recent Lipid Panel    Component Value Date/Time   CHOL 208 (H) 02/25/2019 0916   TRIG 67.0 02/25/2019 0916    HDL 67.40 02/25/2019 0916   CHOLHDL 3 02/25/2019 0916   VLDL 13.4 02/25/2019 0916   LDLCALC 127 (H) 02/25/2019 0916    Physical Exam:    VS:  BP 122/72    Pulse 66    Ht 5\' 4"  (1.626 m)    Wt 201 lb (91.2 kg)    LMP 09/03/2007 (Approximate)  SpO2 98%    BMI 34.50 kg/m     Wt Readings from Last 3 Encounters:  06/18/19 201 lb (91.2 kg)  02/25/19 199 lb 9.6 oz (90.5 kg)  09/09/18 203 lb (92.1 kg)     GEN: Healthy appearing and only slightly overweight.. No acute distress HEENT: Normal NECK: No JVD. LYMPHATICS: No lymphadenopathy CARDIAC:  RRR without murmur, gallop, or edema. VASCULAR:  Normal Pulses. No bruits. RESPIRATORY:  Clear to auscultation without rales, wheezing or rhonchi  ABDOMEN: Soft, non-tender, non-distended, No pulsatile mass, MUSCULOSKELETAL: No deformity  SKIN: Warm and dry NEUROLOGIC:  Alert and oriented x 3 PSYCHIATRIC:  Normal affect   ASSESSMENT:    1. Essential hypertension   2. Other hyperlipidemia   3. Educated about COVID-19 virus infection    PLAN:    In order of problems listed above:  1. Blood pressure is well controlled. 2. Despite significant therapy, (Zetia and Crestor max) LDL most recently in June was 127.  There is either a compliance issue or amplification of a genetic mechanism with time.  I have recommended that she visit the lipid clinic, evaluate current therapy, and construct a treatment plan that will achieve LDL at least less than 100 and I would prefer 70 given her high risk family history. 3. Social distancing, mask wearing, and handwashing is being practiced.  Overall education and awareness concerning primary risk prevention was discussed in detail: LDL less than 70, hemoglobin A1c less than 7, blood pressure target less than 130/80 mmHg, >150 minutes of moderate aerobic activity per week, avoidance of smoking, weight control (via diet and exercise), and continued surveillance/management of/for obstructive sleep  apnea.    Medication Adjustments/Labs and Tests Ordered: Current medicines are reviewed at length with the patient today.  Concerns regarding medicines are outlined above.  Orders Placed This Encounter  Procedures   AMB Referral to [facility]   EKG 12-Lead   No orders of the defined types were placed in this encounter.   Patient Instructions  Medication Instructions:  Your physician recommends that you continue on your current medications as directed. Please refer to the Current Medication list given to you today.  *If you need a refill on your cardiac medications before your next appointment, please call your pharmacy*  Lab Work: None If you have labs (blood work) drawn today and your tests are completely normal, you will receive your results only by:  [place] (if you have MyChart) OR  A paper copy in the mail If you have any lab test that is abnormal or we need to change your treatment, we will call you to review the results.  Testing/Procedures: None  Follow-Up: At [facility], you and your health needs are our priority.  As part of our continuing mission to provide you with exceptional heart care, we have created designated Provider Care Teams.  These Care Teams include your primary Cardiologist (physician) and Advanced Practice Providers (APPs -  Physician Assistants and Nurse Practitioners) who all work together to provide you with the care you need, when you need it.  Your next appointment:   12 months  The format for your next appointment:   In Person  Provider:   You may see Sinclair Grooms, MD or one of the following Advanced Practice Providers on your designated Care Team:    Truitt Merle, NP  Cecilie Kicks, NP  Kathyrn Drown, NP   Other Instructions  You have been referred to our Lipid  Clinic    Signed, Sinclair Grooms, MD  06/18/2019 8:56 AM    [place]

## 2019-06-18 ENCOUNTER — Encounter: Payer: Self-pay | Admitting: Interventional Cardiology

## 2019-06-18 ENCOUNTER — Other Ambulatory Visit: Payer: Self-pay

## 2019-06-18 ENCOUNTER — Ambulatory Visit (INDEPENDENT_AMBULATORY_CARE_PROVIDER_SITE_OTHER): Payer: PRIVATE HEALTH INSURANCE | Admitting: Interventional Cardiology

## 2019-06-18 VITALS — BP 122/72 | HR 66 | Ht 64.0 in | Wt 201.0 lb

## 2019-06-18 DIAGNOSIS — I10 Essential (primary) hypertension: Secondary | ICD-10-CM | POA: Diagnosis not present

## 2019-06-18 DIAGNOSIS — E7849 Other hyperlipidemia: Secondary | ICD-10-CM

## 2019-06-18 DIAGNOSIS — Z7189 Other specified counseling: Secondary | ICD-10-CM

## 2019-06-18 NOTE — Patient Instructions (Addendum)
Medication Instructions:  Your physician recommends that you continue on your current medications as directed. Please refer to the Current Medication list given to you today.  *If you need a refill on your cardiac medications before your next appointment, please call your pharmacy*  Lab Work: None If you have labs (blood work) drawn today and your tests are completely normal, you will receive your results only by: Marland Kitchen MyChart Message (if you have MyChart) OR . A paper copy in the mail If you have any lab test that is abnormal or we need to change your treatment, we will call you to review the results.  Testing/Procedures: None  Follow-Up: At [facility], you and your health needs are our priority.  As part of our continuing mission to provide you with exceptional heart care, we have created designated Provider Care Teams.  These Care Teams include your primary Cardiologist (physician) and Advanced Practice Providers (APPs -  Physician Assistants and Nurse Practitioners) who all work together to provide you with the care you need, when you need it.  Your next appointment:   12 months  The format for your next appointment:   In Person  Provider:   You may see Sinclair Grooms, MD or one of the following Advanced Practice Providers on your designated Care Team:    Truitt Merle, NP  Cecilie Kicks, NP  Kathyrn Drown, NP   Other Instructions  You have been referred to our [facility]

## 2019-07-05 NOTE — Progress Notes (Signed)
Patient ID: Yvonne Sanders                 DOB: 1961/06/20                    MRN: [id number]     HPI: Yvonne Sanders is a 58 y.o. female patient referred to lipid clinic by Dr. Tamala Julian. PMH is significant for HTN, HLD, and aortic valve sclerosis. At her last appointment with Dr. Tamala Julian on 06/18/19, she was referred to lipid clinic since her last LDL in June was still elevated at 127 despite medical therapy with Crestor 40 mg daily plus Zetia 10 mg daily.   Patient presents today for initial visit to lipid clinic. She reports that there have been several personal issues that have caused her to put her own health to the side. She has not been taking her medications regularly (misses about 2 doses per week) and reports that her exercise has been limited due to a recent ligament tear in her knee and reports acute issues regarding her chronic back pain. She says she normally takes her medications at bedtime and once she gets into bed, she does not want to get back up if she realizes she forgot to take her medications. She has adapted to a low-to-no carb diet back in March and says her and her husband are typically very active people who keep each other accountable.  Current Medications: Crestor 40 mg daily, Zetia 10 mg daily - adherence issues with both  Intolerances: none  Risk Factors: HTN, HLD, family history of CAD  LDL goal: <70 given family history  Diet: low to no carb diet since March, air fryer, drinks protein shakes, white meat chicken and fish  Exercise: used to walk frequently and do lawn care, errands, but tore ligament in knee and acute on chronic back issues (getting MRI soon)  Family History: Breast cancer in her maternal aunt and maternal grandmother; Glaucoma in her father; Heart disease in her father; Hypertension in her father; Kidney disease on HD in her father; Macular degeneration in her father; Prostate cancer in her father; Renal Disease in her father; Stroke in her father.   Social History: no smoking, no alcohol   Labs: 02/25/19: TC 208, TG 67, HDL 67, LDL 127 - crestor 40 + zetia 10 08/13/18: TC 240, TG 81, HDL 58, LDL 165 - crestor 40 + zetia 10 02/19/18: TC 194, TG 81, HDL 71, LDL 107 - crestor 40 + zetia 10 05/15/18: TC 179, TG 86, HDL 85.2, LDL 76 - crestor 40 + zetia 10  Past Medical History:  Diagnosis Date  . Abnormal pap 1989   cryo  . Arthritis   . Chicken pox   . Endometriosis   . 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 Glaucoma   . Headache(784.0)   . Hyperlipidemia   . Hypertension       . Migraine   . Positive TB test   . Tibia fracture    Right leg.      Current Outpatient Medications on File Prior to Visit  Medication Sig Dispense Refill  . aspirin EC 81 MG tablet Take 1 tablet (81 mg total) by mouth daily. 90 tablet 3  . aspirin-acetaminophen-caffeine (EXCEDRIN MIGRAINE) 250-250-65 MG tablet Take 1 tablet by mouth every 6 (six) hours as needed for headache or migraine.    . Biotin 5000 MCG CAPS Take 5,000 mcg by mouth daily.      Marland Kitchen  Brinzolamide-Brimonidine (SIMBRINZA) 1-0.2 % SUSP Place 1 drop into both eyes 2 (two) times daily.    . Calcium Carbonate Antacid (TUMS PO) Take 1 tablet by mouth daily as needed (heartburn).     . Carboxymethylcellul-Glycerin 0.5-0.9 % SOLN Apply 1 drop to eye daily as needed (eyes).     . Cholecalciferol (VITAMIN D3) 2000 units TABS Take 1 tablet by mouth daily.    . cyclobenzaprine (FLEXERIL) 10 MG tablet take 1 tablet by mouth three times a day if needed for muscle spasm 30 tablet 2  . diclofenac sodium (VOLTAREN) 1 % GEL Apply 4 g topically 4 (four) times daily. 100 g 2  . esomeprazole (NEXIUM) 40 MG capsule Take 1 capsule (40 mg total) by mouth daily. 30 capsule 3  . ezetimibe (ZETIA) 10 MG tablet TAKE 1 TABLET(10 MG) BY MOUTH DAILY 90 tablet 1  . folic acid (FOLVITE) Q000111Q MCG tablet Take 400 mcg by mouth daily.      Marland Kitchen HYDROcodone-acetaminophen (NORCO/VICODIN) 5-325 MG tablet Take 1 tablet by  mouth every 6 (six) hours as needed for moderate pain. 30 tablet 0  . ibuprofen (ADVIL,MOTRIN) 800 MG tablet Take 800 mg by mouth every 8 (eight) hours as needed. For pain     . Krill Oil 300 MG CAPS Take 1 capsule by mouth daily.     . metoprolol succinate (TOPROL-XL) 50 MG 24 hr tablet TAKE 1 TABLET BY MOUTH EVERY DAY WITH OR IMMEDIATELY FOLLOWING A MEAL 90 tablet 0  . Multiple Vitamins-Minerals (MULTIVITAMINS THER. W/MINERALS) TABS Take 1 tablet by mouth daily.      . Netarsudil-Latanoprost (ROCKLATAN) 0.02-0.005 % SOLN Apply to eye.    . rosuvastatin (CRESTOR) 40 MG tablet TAKE 1 TABLET(40 MG) BY MOUTH DAILY 90 tablet 1  . triamcinolone cream (KENALOG) 0.1 % Apply 1 application topically 2 (two) times daily. 45 g 2   No current facility-administered medications on file prior to visit.     Allergies  Allergen Reactions  . Nubain [Nalbuphine Hcl] Anaphylaxis  . Corticosteroids Other (See Comments)    Due to glaucoma can not have steroids  . Dilaudid [Hydromorphone Hcl] Nausea And Vomiting    Severe itching  . Nubain [Nalbuphine Hcl]   . Oxycodone-Acetaminophen Itching  . Percodan [Oxycodone-Aspirin] Hives and Itching  . Tomato Rash    Rash, spots on tongue    Assessment/Plan:  1. Hyperlipidemia - LDL is above goal at 127 (goal <70 with family history of CAD). Patient reports non-adherence with medications saying she forgets doses about 2 times per week. Educated extensively on the importance of medication adherence and reviewed strategies to lessen missed doses. She says she takes her medications at night so we discussed her keeping her medications on her nightstand. Patient is hesitant to add on any additional medications at this time for her hyperlipidemia given her self-awareness of not taking her current regimen as directed. She would like to focus on improving her current therapy - taking her medications every day, continuing diet, increasing exercise - and will check her next  lipid during her visit with her PCP on 08/18/19. Continue taking Crestor 40 mg daily and Zetia 10 mg daily. Will follow up with patient after her appointment with PCP to review labs and discuss future follow up with lipid clinic.   Vertis Kelch, PharmD PGY2 Cardiology Pharmacy Resident [facility] [id number], [place], [place] [address] Phone: [phone]; Fax: [phone]

## 2019-07-06 ENCOUNTER — Other Ambulatory Visit: Payer: Self-pay

## 2019-07-06 ENCOUNTER — Ambulatory Visit (INDEPENDENT_AMBULATORY_CARE_PROVIDER_SITE_OTHER): Payer: PRIVATE HEALTH INSURANCE

## 2019-07-06 VITALS — BP 132/86 | HR 63

## 2019-07-06 DIAGNOSIS — E7849 Other hyperlipidemia: Secondary | ICD-10-CM

## 2019-07-06 DIAGNOSIS — E785 Hyperlipidemia, unspecified: Secondary | ICD-10-CM | POA: Diagnosis not present

## 2019-07-06 MED ORDER — EZETIMIBE 10 MG PO TABS: ORAL_TABLET | ORAL | 1 refills | Status: DC

## 2019-07-06 MED ORDER — ROSUVASTATIN CALCIUM 40 MG PO TABS: ORAL_TABLET | ORAL | 1 refills | Status: DC

## 2019-07-06 NOTE — Patient Instructions (Addendum)
Thank you for seeing [place] today!  Your LDL is above goal at 127 (goal < 70)  No medication changes for today - we will focus on working toward taking your medications on a daily basis. Please remember to keep your medicines on your nightstand.  Your next appointment with Dr. Carollee Herter 12/16 at 8:20 AM  I will follow up with you after your next cholesterol check in December.

## 2019-08-08 ENCOUNTER — Other Ambulatory Visit: Payer: Self-pay | Admitting: Family Medicine

## 2019-08-08 DIAGNOSIS — E785 Hyperlipidemia, unspecified: Secondary | ICD-10-CM

## 2019-08-18 ENCOUNTER — Other Ambulatory Visit: Payer: Self-pay

## 2019-08-18 ENCOUNTER — Ambulatory Visit (INDEPENDENT_AMBULATORY_CARE_PROVIDER_SITE_OTHER): Payer: PRIVATE HEALTH INSURANCE | Admitting: Family Medicine

## 2019-08-18 ENCOUNTER — Encounter: Payer: Self-pay | Admitting: Family Medicine

## 2019-08-18 DIAGNOSIS — G894 Chronic pain syndrome: Secondary | ICD-10-CM

## 2019-08-18 DIAGNOSIS — I10 Essential (primary) hypertension: Secondary | ICD-10-CM | POA: Diagnosis not present

## 2019-08-18 DIAGNOSIS — K219 Gastro-esophageal reflux disease without esophagitis: Secondary | ICD-10-CM | POA: Diagnosis not present

## 2019-08-18 DIAGNOSIS — M25561 Pain in right knee: Secondary | ICD-10-CM | POA: Diagnosis not present

## 2019-08-18 DIAGNOSIS — G8929 Other chronic pain: Secondary | ICD-10-CM

## 2019-08-18 DIAGNOSIS — M25562 Pain in left knee: Secondary | ICD-10-CM

## 2019-08-18 DIAGNOSIS — E785 Hyperlipidemia, unspecified: Secondary | ICD-10-CM

## 2019-08-18 MED ORDER — ESOMEPRAZOLE MAGNESIUM 40 MG PO CPDR: 40.0000 mg | DELAYED_RELEASE_CAPSULE | Freq: Every day | ORAL | 3 refills | Status: DC

## 2019-08-18 MED ORDER — DICLOFENAC SODIUM 1 % EX GEL: 4.0000 g | Freq: Four times a day (QID) | CUTANEOUS | 2 refills | Status: DC

## 2019-08-18 MED ORDER — CYCLOBENZAPRINE HCL 10 MG PO TABS: ORAL_TABLET | ORAL | 2 refills | Status: DC

## 2019-08-18 NOTE — Progress Notes (Signed)
Virtual Visit via Video Note  I connected with Yvonne Sanders on 08/18/19 at  8:20 AM EST by a video enabled telemedicine application and verified that I am speaking with the correct person using two identifiers.   Location: Patient: home Provider: home    I discussed the limitations of evaluation and management by telemedicine and the availability of in person appointments. The patient expressed understanding and agreed to proceed.  History of Present Illness:  pt is home and needs f/u bp and chol.  She is seeing cardiology and they refilled her meds    Observations/Objective: bp 132/86  p 63  Pt is in NAD  Assessment and Plan: 1. Essential hypertension Well controlled, no changes to meds. Encouraged heart healthy diet such as the DASH diet and exercise as tolerated.  - Lipid panel; Future - Comprehensive metabolic panel; Future  2. Hyperlipidemia, unspecified hyperlipidemia type Tolerating statin, encouraged heart healthy diet, avoid trans fats, minimize simple carbs and saturated fats. Increase exercise as tolerated - Lipid panel; Future - Comprehensive metabolic panel; Future  3. Gastroesophageal reflux disease stable - esomeprazole (NEXIUM) 40 MG capsule; Take 1 capsule (40 mg total) by mouth daily.  Dispense: 90 capsule; Refill: 3  4. Chronic pain of both knees Stable F/u ortho - diclofenac Sodium (VOLTAREN) 1 % GEL; Apply 4 g topically 4 (four) times daily.  Dispense: 350 g; Refill: 2  5. Chronic pain syndrome Back pain---- per ortho con't muscle relaxer and PT for now - cyclobenzaprine (FLEXERIL) 10 MG tablet; take 1 tablet by mouth three times a day if needed for muscle spasm  Dispense: 30 tablet; Refill: 2  Follow Up Instructions:    I discussed the assessment and treatment plan with the patient. The patient was provided an opportunity to ask questions and all were answered. The patient agreed with the plan and demonstrated an understanding of the  instructions.   The patient was advised to call back or seek an in-person evaluation if the symptoms worsen or if the condition fails to improve as anticipated.  I provided 15 minutes of non-face-to-face time during this encounter.   Ann Held, DO

## 2019-08-25 ENCOUNTER — Encounter: Payer: Self-pay | Admitting: Family Medicine

## 2019-08-30 ENCOUNTER — Other Ambulatory Visit: Payer: Self-pay

## 2019-08-30 MED ORDER — METOPROLOL SUCCINATE ER 50 MG PO TB24: ORAL_TABLET | ORAL | 2 refills | Status: DC

## 2019-09-01 ENCOUNTER — Other Ambulatory Visit (INDEPENDENT_AMBULATORY_CARE_PROVIDER_SITE_OTHER): Payer: PRIVATE HEALTH INSURANCE

## 2019-09-01 ENCOUNTER — Other Ambulatory Visit: Payer: Self-pay | Admitting: Family Medicine

## 2019-09-01 DIAGNOSIS — E785 Hyperlipidemia, unspecified: Secondary | ICD-10-CM | POA: Diagnosis not present

## 2019-09-01 DIAGNOSIS — I10 Essential (primary) hypertension: Secondary | ICD-10-CM | POA: Diagnosis not present

## 2019-09-01 DIAGNOSIS — R7989 Other specified abnormal findings of blood chemistry: Secondary | ICD-10-CM

## 2019-09-01 LAB — COMPREHENSIVE METABOLIC PANEL
ALT: 117 U/L — ABNORMAL HIGH (ref 0–35)
AST: 79 U/L — ABNORMAL HIGH (ref 0–37)
Albumin: 4.4 g/dL (ref 3.5–5.2)
Alkaline Phosphatase: 88 U/L (ref 39–117)
BUN: 9 mg/dL (ref 6–23)
CO2: 28 mEq/L (ref 19–32)
Calcium: 9.7 mg/dL (ref 8.4–10.5)
Chloride: 107 mEq/L (ref 96–112)
Creatinine, Ser: 0.63 mg/dL (ref 0.40–1.20)
GFR: 117.32 mL/min (ref >60.00)
Glucose, Bld: 93 mg/dL (ref 70–99)
Potassium: 4 mEq/L (ref 3.5–5.1)
Sodium: 142 mEq/L (ref 135–145)
Total Bilirubin: 0.5 mg/dL (ref 0.2–1.2)
Total Protein: 7.6 g/dL (ref 6.0–8.3)

## 2019-09-01 LAB — LIPID PANEL
Cholesterol: 190 mg/dL (ref 0–200)
HDL: 78.7 mg/dL (ref >39.00)
LDL Cholesterol: 93 mg/dL (ref 0–99)
NonHDL: 111.55
Total CHOL/HDL Ratio: 2
Triglycerides: 92 mg/dL (ref 0.0–149.0)
VLDL: 18.4 mg/dL (ref 0.0–40.0)

## 2019-09-21 NOTE — Progress Notes (Signed)
59 y.o. G98P1001 Married [place] American female here for annual exam.    Some vaginal dryness which is not a change for her.  Not of significant concern to her.   Denies vaginal bleeding.   She really wants a pap today, even if she needs to pay out of pocket.   Caring for her father at home.   Labs with PCP.  She had elevated liver function.   PCP: Roma Schanz, MD    Patient's last menstrual period was 09/03/2007 (approximate).           Sexually active: Yes.    The current method of family planning is post menopausal status.    Exercising: No.  some walking Smoker:  no  Health Maintenance: Pap: 09-09-18 Neg:Neg HR HPV,08-07-17 Neg:Neg HR HPV, 07-03-16 Neg:Neg HR HPV  History of abnormal Pap:  Yes, Hx [id number], 2012, 2013. Pap ASCUS in 2014, and then normal in 2015 and 2016. MMG: 04-23-18 Neg/density B/BiRads1-patient knows needs to schedule Colonoscopy: NEVER .  She will schedule through her PCP.  BMD:   N/A  Result  N/A TDaP:  2016 Gardasil:   no HIV:Neg years ago Hep C: 10-12-15 Neg Screening Labs:   PCP.  Flu vaccine:  Completed.  Shingrix:  Completed.    reports that she has never smoked. She has never used smokeless tobacco. She reports that she does not drink alcohol or use drugs.  Past Medical History:  Diagnosis Date  . Abnormal pap 1989   cryo  . Arthritis   . Chicken pox   . Endometriosis   . 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 Glaucoma   . Headache(784.0)   . Hyperlipidemia   . Hypertension       . Migraine   . Positive TB test   . Tibia fracture    Right leg.      Past Surgical History:  Procedure Laterality Date  . APPENDECTOMY  75  . BACK SURGERY  99   HNP  . BREAST BIOPSY     benign  . BREAST EXCISIONAL BIOPSY Right   . CHOLECYSTECTOMY  88  . DIAGNOSTIC LAPAROSCOPY  88   endometrious  . EYE SURGERY  12   laser for glaucoma  . GLAUCOMA SURGERY Left 2019  . LUMBAR FUSION  08/2011   l 3-4  . LUMBAR FUSION     l 4-5  .  RIGHT OOPHORECTOMY  75    Current Outpatient Medications  Medication Sig Dispense Refill  . aspirin EC 81 MG tablet Take 1 tablet (81 mg total) by mouth daily. 90 tablet 3  . aspirin-acetaminophen-caffeine (EXCEDRIN MIGRAINE) 250-250-65 MG tablet Take 1 tablet by mouth every 6 (six) hours as needed for headache or migraine.    . Biotin 5000 MCG CAPS Take 5,000 mcg by mouth daily.      . Brinzolamide-Brimonidine (SIMBRINZA) 1-0.2 % SUSP Place 1 drop into both eyes 3 (three) times daily.     . Calcium Carbonate Antacid (TUMS PO) Take 1 tablet by mouth daily as needed (heartburn).     . Carboxymethylcellul-Glycerin 0.5-0.9 % SOLN Apply 1 drop to eye daily as needed (eyes). Refresh eye drops    . Cholecalciferol (VITAMIN D3) 2000 units TABS Take 1 tablet by mouth daily.    . cyclobenzaprine (FLEXERIL) 10 MG tablet take 1 tablet by mouth three times a day if needed for muscle spasm 30 tablet 2  . diclofenac Sodium (VOLTAREN) 1 % GEL Apply  4 g topically 4 (four) times daily. 350 g 2  . esomeprazole (NEXIUM) 40 MG capsule Take 1 capsule (40 mg total) by mouth daily. 90 capsule 3  . ezetimibe (ZETIA) 10 MG tablet TAKE 1 TABLET(10 MG) BY MOUTH DAILY 90 tablet 0  . folic acid (FOLVITE) Q000111Q MCG tablet Take 400 mcg by mouth daily.      Marland Kitchen HYDROcodone-acetaminophen (NORCO/VICODIN) 5-325 MG tablet Take 1 tablet by mouth every 6 (six) hours as needed for moderate pain. 30 tablet 0  . latanoprost (XALATAN) 0.005 % ophthalmic solution latanoprost 0.005 % eye drops    . metoprolol succinate (TOPROL-XL) 50 MG 24 hr tablet TAKE 1 TABLET BY MOUTH EVERY DAY WITH OR IMMEDIATELY FOLLOWING A MEAL 90 tablet 2  . Multiple Vitamins-Minerals (MULTIVITAMINS THER. W/MINERALS) TABS Take 1 tablet by mouth daily.      . Netarsudil-Latanoprost (ROCKLATAN) 0.02-0.005 % SOLN Apply to eye.    . NON FORMULARY Advil dual therapy (acetaminophen). 2 tablets as needed for pain    . rosuvastatin (CRESTOR) 40 MG tablet TAKE 1 TABLET(40  MG) BY MOUTH DAILY 90 tablet 0   No current facility-administered medications for this visit.    Family History  Problem Relation Age of Onset  . Hypertension Father   . Glaucoma Father   . Prostate cancer Father   . Renal Disease Father   . Macular degeneration Father   . Stroke Father   . Kidney disease Father   . Heart disease Father   . Breast cancer Maternal Grandmother   . Breast cancer Maternal Aunt     Review of Systems  All other systems reviewed and are negative.   Exam:   BP 138/88 (Cuff Size: Large)   Pulse 70   Temp (!) 96.7 F (35.9 C) (Temporal)   Resp 12   Ht 5' 4.25" (1.632 m)   Wt 202 lb 12.8 oz (92 kg)   LMP 09/03/2007 (Approximate)   BMI 34.54 kg/m     General appearance: alert, cooperative and appears stated age Head: normocephalic, without obvious abnormality, atraumatic Neck: no adenopathy, supple, symmetrical, trachea midline and thyroid normal to inspection and palpation Lungs: clear to auscultation bilaterally Breasts: normal appearance, no masses or tenderness, No nipple retraction or dimpling, No nipple discharge or bleeding, No axillary adenopathy Heart: regular rate and rhythm Abdomen: soft, non-tender; no masses, no organomegaly Extremities: extremities normal, atraumatic, no cyanosis or edema Skin: skin color, texture, turgor normal. No rashes or lesions Lymph nodes: cervical, supraclavicular, and axillary nodes normal. Neurologic: grossly normal  Pelvic: External genitalia:  no lesions              No abnormal inguinal nodes palpated.              Urethra:  normal appearing urethra with no masses, tenderness or lesions              Bartholins and Skenes: normal                 Vagina: normal appearing vagina with normal color and discharge, no lesions.  Minor cystocele and rectocele, about first degree each.              Cervix: no lesions.  Small cervical os opening.               Pap taken: Yes.   Bimanual Exam:  Uterus:  normal  size, contour, position, consistency, mobility, non-tender  Adnexa: no mass, fullness, tenderness              Rectal exam: Yes.  .  Confirms.              Anus:  normal sphincter tone, no lesions  Chaperone was present for exam.  Assessment:   Well woman visit with normal exam. Hx LGSIL.  Hx cryotherapy.  ?Hx LEEP.  Minor cystocele and rectocele.  Glaucoma. Status post right oophorectomy.  Hx endometriosis.  Plan: Mammogram screening discussed. Self breast awareness reviewed. Pap and HR HPV as above.  We discussed new guidelines for cervical cancer screening. Guidelines for Calcium, Vitamin D, regular exercise program including cardiovascular and weight bearing exercise. We discussed her cystocele and rectocele.  Ok for observational management and avoidance of straining/heavy lifting currently.  I did mention pelvic floor therapy.  I do not recommend surgery at this juncture. Follow up annually and prn.   After visit summary provided.

## 2019-09-22 ENCOUNTER — Other Ambulatory Visit (HOSPITAL_COMMUNITY)
Admission: RE | Admit: 2019-09-22 | Discharge: 2019-09-22 | Disposition: A | Discharge status: Home / Self Care | Payer: PRIVATE HEALTH INSURANCE | Source: Ambulatory Visit | Attending: Obstetrics and Gynecology | Admitting: Obstetrics and Gynecology

## 2019-09-22 ENCOUNTER — Ambulatory Visit (INDEPENDENT_AMBULATORY_CARE_PROVIDER_SITE_OTHER): Payer: PRIVATE HEALTH INSURANCE | Admitting: Obstetrics and Gynecology

## 2019-09-22 ENCOUNTER — Encounter: Payer: Self-pay | Admitting: Obstetrics and Gynecology

## 2019-09-22 ENCOUNTER — Other Ambulatory Visit: Payer: Self-pay

## 2019-09-22 VITALS — BP 138/88 | HR 70 | Temp 96.7°F | Resp 12 | Ht 64.25 in | Wt 202.8 lb

## 2019-09-22 DIAGNOSIS — Z01419 Encounter for gynecological examination (general) (routine) without abnormal findings: Secondary | ICD-10-CM

## 2019-09-22 NOTE — Patient Instructions (Signed)

## 2019-09-26 LAB — CYTOLOGY - PAP
Adequacy: ABSENT
Comment: NEGATIVE
Diagnosis: NEGATIVE
High risk HPV: NEGATIVE

## 2019-10-12 ENCOUNTER — Other Ambulatory Visit: Payer: Self-pay | Admitting: Neurosurgery

## 2019-10-12 ENCOUNTER — Other Ambulatory Visit: Payer: Self-pay

## 2019-10-12 ENCOUNTER — Ambulatory Visit: Payer: PRIVATE HEALTH INSURANCE

## 2019-10-12 ENCOUNTER — Ambulatory Visit (INDEPENDENT_AMBULATORY_CARE_PROVIDER_SITE_OTHER): Payer: PRIVATE HEALTH INSURANCE

## 2019-10-12 DIAGNOSIS — M7138 Other bursal cyst, other site: Secondary | ICD-10-CM

## 2019-10-12 DIAGNOSIS — M7130 Other bursal cyst, unspecified site: Secondary | ICD-10-CM

## 2019-10-12 NOTE — Assessment & Plan Note (Signed)
Low back pain . 

## 2020-02-28 ENCOUNTER — Ambulatory Visit (INDEPENDENT_AMBULATORY_CARE_PROVIDER_SITE_OTHER): Payer: PRIVATE HEALTH INSURANCE | Admitting: Family Medicine

## 2020-02-28 ENCOUNTER — Other Ambulatory Visit: Payer: Self-pay

## 2020-02-28 ENCOUNTER — Telehealth: Payer: Self-pay | Admitting: *Deleted

## 2020-02-28 ENCOUNTER — Encounter: Payer: Self-pay | Admitting: Family Medicine

## 2020-02-28 VITALS — BP 110/82 | HR 69 | Temp 97.1°F | Resp 18 | Ht 62.25 in | Wt 205.2 lb

## 2020-02-28 DIAGNOSIS — Z79899 Other long term (current) drug therapy: Secondary | ICD-10-CM

## 2020-02-28 DIAGNOSIS — K219 Gastro-esophageal reflux disease without esophagitis: Secondary | ICD-10-CM | POA: Diagnosis not present

## 2020-02-28 DIAGNOSIS — E785 Hyperlipidemia, unspecified: Secondary | ICD-10-CM

## 2020-02-28 DIAGNOSIS — R252 Cramp and spasm: Secondary | ICD-10-CM

## 2020-02-28 DIAGNOSIS — M48061 Spinal stenosis, lumbar region without neurogenic claudication: Secondary | ICD-10-CM | POA: Diagnosis not present

## 2020-02-28 DIAGNOSIS — M791 Myalgia, unspecified site: Secondary | ICD-10-CM

## 2020-02-28 DIAGNOSIS — Z0000 Encounter for general adult medical examination without abnormal findings: Secondary | ICD-10-CM

## 2020-02-28 DIAGNOSIS — Z1211 Encounter for screening for malignant neoplasm of colon: Secondary | ICD-10-CM | POA: Diagnosis not present

## 2020-02-28 DIAGNOSIS — G894 Chronic pain syndrome: Secondary | ICD-10-CM

## 2020-02-28 LAB — LIPID PANEL
Cholesterol: 180 mg/dL (ref 0–200)
HDL: 64.6 mg/dL (ref >39.00)
LDL Cholesterol: 101 mg/dL — ABNORMAL HIGH (ref 0–99)
NonHDL: 115.33
Total CHOL/HDL Ratio: 3
Triglycerides: 73 mg/dL (ref 0.0–149.0)
VLDL: 14.6 mg/dL (ref 0.0–40.0)

## 2020-02-28 LAB — COMPREHENSIVE METABOLIC PANEL
ALT: 52 U/L — ABNORMAL HIGH (ref 0–35)
AST: 34 U/L (ref 0–37)
Albumin: 4.6 g/dL (ref 3.5–5.2)
Alkaline Phosphatase: 82 U/L (ref 39–117)
BUN: 10 mg/dL (ref 6–23)
CO2: 26 mEq/L (ref 19–32)
Calcium: 9.6 mg/dL (ref 8.4–10.5)
Chloride: 105 mEq/L (ref 96–112)
Creatinine, Ser: 0.68 mg/dL (ref 0.40–1.20)
GFR: 107.24 mL/min (ref >60.00)
Glucose, Bld: 98 mg/dL (ref 70–99)
Potassium: 3.9 mEq/L (ref 3.5–5.1)
Sodium: 140 mEq/L (ref 135–145)
Total Bilirubin: 0.6 mg/dL (ref 0.2–1.2)
Total Protein: 7.4 g/dL (ref 6.0–8.3)

## 2020-02-28 LAB — CBC WITH DIFFERENTIAL/PLATELET
Basophils Absolute: 0 10*3/uL (ref 0.0–0.1)
Basophils Relative: 0.7 % (ref 0.0–3.0)
Eosinophils Absolute: 0.1 10*3/uL (ref 0.0–0.7)
Eosinophils Relative: 1 % (ref 0.0–5.0)
HCT: 38.4 % (ref 36.0–46.0)
Hemoglobin: 12.2 g/dL (ref 12.0–15.0)
Lymphocytes Relative: 30.8 % (ref 12.0–46.0)
Lymphs Abs: 1.7 10*3/uL (ref 0.7–4.0)
MCHC: 31.9 g/dL (ref 30.0–36.0)
MCV: 85.8 fl (ref 78.0–100.0)
Monocytes Absolute: 0.4 10*3/uL (ref 0.1–1.0)
Monocytes Relative: 6.8 % (ref 3.0–12.0)
Neutro Abs: 3.4 10*3/uL (ref 1.4–7.7)
Neutrophils Relative %: 60.7 % (ref 43.0–77.0)
Platelets: 199 10*3/uL (ref 150.0–400.0)
RBC: 4.47 Mil/uL (ref 3.87–5.11)
RDW: 12.9 % (ref 11.5–15.5)
WBC: 5.6 10*3/uL (ref 4.0–10.5)

## 2020-02-28 LAB — MAGNESIUM: Magnesium: 1.9 mg/dL (ref 1.5–2.5)

## 2020-02-28 LAB — TSH: TSH: 0.84 u[IU]/mL (ref 0.35–4.50)

## 2020-02-28 LAB — PHOSPHORUS: Phosphorus: 3.6 mg/dL (ref 2.3–4.6)

## 2020-02-28 LAB — CK: Total CK: 174 U/L (ref 7–177)

## 2020-02-28 MED ORDER — HYDROCODONE-ACETAMINOPHEN 5-325 MG PO TABS: 1.0000 | ORAL_TABLET | Freq: Four times a day (QID) | ORAL | 0 refills | Status: DC | PRN

## 2020-02-28 MED ORDER — ESOMEPRAZOLE MAGNESIUM 40 MG PO CPDR: 40.0000 mg | DELAYED_RELEASE_CAPSULE | Freq: Every day | ORAL | 3 refills | Status: DC

## 2020-02-28 MED ORDER — CARISOPRODOL 350 MG PO TABS: 350.0000 mg | ORAL_TABLET | Freq: Four times a day (QID) | ORAL | 0 refills | Status: DC | PRN

## 2020-02-28 MED ORDER — ROSUVASTATIN CALCIUM 40 MG PO TABS: ORAL_TABLET | ORAL | 1 refills | Status: DC

## 2020-02-28 MED ORDER — EZETIMIBE 10 MG PO TABS: ORAL_TABLET | ORAL | 0 refills | Status: DC

## 2020-02-28 NOTE — Progress Notes (Signed)
Subjective:     Yvonne Sanders is a 59 y.o. female and is here for a comprehensive physical exam. The patient reports problems - has had some back problems but has seen Dr Christella Noa for this and has muscle relaxers . She also c/o leg cramps and aching in legs and arms -- she has been trying tonic water with little help She takes mag supplement 400 mg and has calcium in her mvi.   She also c/o worsening gerd issues --- she takes nexium qd  Social History   Socioeconomic History  . Marital status: Married    Spouse name: Not on file  . Number of children: Not on file  . Years of education: Not on file  . Highest education level: Not on file  Occupational History  . Not on file  Tobacco Use  . Smoking status: Never Smoker  . Smokeless tobacco: Never Used  Vaping Use  . Vaping Use: Never used  Substance and Sexual Activity  . Alcohol use: No    Comment: occ. wine  . Drug use: No  . Sexual activity: Yes    Partners: Male    Birth control/protection: Post-menopausal  Other Topics Concern  . Not on file  Social History Narrative  . Not on file   Social Determinants of Health   Financial Resource Strain:   . Difficulty of Paying Living Expenses:   Food Insecurity:   . Worried About Charity fundraiser in the Last Year:   . Arboriculturist in the Last Year:   Transportation Needs:   . Film/video editor (Medical):   Marland Kitchen Lack of Transportation (Non-Medical):   Physical Activity:   . Days of Exercise per Week:   . Minutes of Exercise per Session:   Stress:   . Feeling of Stress :   Social Connections:   . Frequency of Communication with Friends and Family:   . Frequency of Social Gatherings with Friends and Family:   . Attends Religious Services:   . Active Member of Clubs or Organizations:   . Attends Archivist Meetings:   Marland Kitchen Marital Status:   Intimate Partner Violence:   . Fear of Current or Ex-Partner:   . Emotionally Abused:   Marland Kitchen Physically Abused:   .  Sexually Abused:    Health Maintenance  Topic Date Due  . COVID-19 Vaccine (1) Never done  . HIV Screening  Never done  . COLONOSCOPY  Never done  . INFLUENZA VACCINE  04/02/2020  . MAMMOGRAM  04/23/2020  . PAP SMEAR-Modifier  09/21/2022  . TETANUS/TDAP  09/22/2024  . Hepatitis C Screening  Completed    The following portions of the patient's history were reviewed and updated as appropriate:  She  has a past medical history of Abnormal pap (1989), Arthritis, Chicken pox, Endometriosis, Fibroid (1994), Glaucoma, Headache(784.0), Hyperlipidemia, Hypertension, Migraine, Positive TB test, and Tibia fracture. She does not have any pertinent problems on file. She  has a past surgical history that includes Back surgery (89); Cholecystectomy (88); Diagnostic laparoscopy (88); Appendectomy (75); Right oophorectomy (75); Eye surgery (12); Breast biopsy; Lumbar fusion (08/2011); Lumbar fusion; Breast excisional biopsy (Right); and Glaucoma surgery (Left, 2019). Her family history includes Breast cancer in her maternal aunt and maternal grandmother; Glaucoma in her father; Heart disease in her father; Hypertension in her father; Kidney disease in her father; Macular degeneration in her father; Prostate cancer in her father; Renal Disease in her father; Stroke in her father.  She  reports that she has never smoked. She has never used smokeless tobacco. She reports that she does not drink alcohol and does not use drugs. She has a current medication list which includes the following prescription(s): aspirin ec, aspirin-acetaminophen-caffeine, biotin, brinzolamide-brimonidine, calcium carbonate antacid, carboxymethylcellul-glycerin, vitamin d3, cyclobenzaprine, diclofenac sodium, folic acid, hydrocodone-acetaminophen, latanoprost, metoprolol succinate, multivitamins ther. w/minerals, netarsudil-latanoprost, NON FORMULARY, esomeprazole, ezetimibe, and rosuvastatin. Current Outpatient Medications on File Prior to  Visit  Medication Sig Dispense Refill  . aspirin EC 81 MG tablet Take 1 tablet (81 mg total) by mouth daily. 90 tablet 3  . aspirin-acetaminophen-caffeine (EXCEDRIN MIGRAINE) 250-250-65 MG tablet Take 1 tablet by mouth every 6 (six) hours as needed for headache or migraine.    . Biotin 5000 MCG CAPS Take 5,000 mcg by mouth daily.      . Brinzolamide-Brimonidine (SIMBRINZA) 1-0.2 % SUSP Place 1 drop into both eyes 3 (three) times daily.     . Calcium Carbonate Antacid (TUMS PO) Take 1 tablet by mouth daily as needed (heartburn).     . Carboxymethylcellul-Glycerin 0.5-0.9 % SOLN Apply 1 drop to eye daily as needed (eyes). Refresh eye drops    . Cholecalciferol (VITAMIN D3) 2000 units TABS Take 1 tablet by mouth daily.    . cyclobenzaprine (FLEXERIL) 10 MG tablet take 1 tablet by mouth three times a day if needed for muscle spasm 30 tablet 2  . diclofenac Sodium (VOLTAREN) 1 % GEL Apply 4 g topically 4 (four) times daily. 497 g 2  . folic acid (FOLVITE) 026 MCG tablet Take 400 mcg by mouth daily.      Marland Kitchen HYDROcodone-acetaminophen (NORCO/VICODIN) 5-325 MG tablet Take 1 tablet by mouth every 6 (six) hours as needed for moderate pain. 30 tablet 0  . latanoprost (XALATAN) 0.005 % ophthalmic solution latanoprost 0.005 % eye drops    . metoprolol succinate (TOPROL-XL) 50 MG 24 hr tablet TAKE 1 TABLET BY MOUTH EVERY DAY WITH OR IMMEDIATELY FOLLOWING A MEAL 90 tablet 2  . Multiple Vitamins-Minerals (MULTIVITAMINS THER. W/MINERALS) TABS Take 1 tablet by mouth daily.      . Netarsudil-Latanoprost (ROCKLATAN) 0.02-0.005 % SOLN Apply to eye.    . NON FORMULARY Advil dual therapy (acetaminophen). 2 tablets as needed for pain     No current facility-administered medications on file prior to visit.   She is allergic to nubain [nalbuphine hcl], corticosteroids, dilaudid [hydromorphone hcl], nubain [nalbuphine hcl], oxycodone-acetaminophen, percodan [oxycodone-aspirin], and tomato..  Review of Systems Review of  Systems  Constitutional: Negative for activity change, appetite change and fatigue.  HENT: Negative for hearing loss, congestion, tinnitus and ear discharge.  dentist q76m Eyes: Negative for visual disturbance (see optho q1y -- vision corrected to 20/20 with glasses).  Respiratory: Negative for cough, chest tightness and shortness of breath.   Cardiovascular: Negative for chest pain, palpitations and leg swelling.  Gastrointestinal: Negative for abdominal pain, diarrhea, constipation and abdominal distention.  Genitourinary: Negative for urgency, frequency, decreased urine volume and difficulty urinating.  Musculoskeletal: Negative for back pain, arthralgias and gait problem. + myalgias--- had back pain but that is better Skin: Negative for color change, pallor and rash.  Neurological: Negative for dizziness, light-headedness, numbness and headaches.  Hematological: Negative for adenopathy. Does not bruise/bleed easily.  Psychiatric/Behavioral: Negative for suicidal ideas, confusion, sleep disturbance, self-injury, dysphoric mood, decreased concentration and agitation.       Objective:    BP 110/82 (BP Location: Right Arm, Patient Position: Sitting, Cuff Size: Normal)   Pulse 69  Temp (!) 97.1 F (36.2 C) (Temporal)   Resp 18   Ht 5' 2.25" (1.581 m)   Wt 205 lb 3.2 oz (93.1 kg)   LMP 09/03/2007 (Approximate)   SpO2 99%   BMI 37.23 kg/m  General appearance: alert, cooperative, appears stated age and no distress Head: Normocephalic, without obvious abnormality, atraumatic Eyes: negative findings: lids and lashes normal Ears: normal TM's and external ear canals both ears Neck: no adenopathy, no carotid bruit, no JVD, supple, symmetrical, trachea midline and thyroid not enlarged, symmetric, no tenderness/mass/nodules Back: symmetric, no curvature. ROM normal. No CVA tenderness. Lungs: clear to auscultation bilaterally Breasts: gyn Heart: regular rate and rhythm, S1, S2 normal, no  murmur, click, rub or gallop Abdomen: soft, non-tender; bowel sounds normal; no masses,  no organomegaly Pelvic: deferred--gyn Extremities: extremities normal, atraumatic, no cyanosis or edema Pulses: 2+ and symmetric Skin: Skin color, texture, turgor normal. No rashes or lesions Lymph nodes: Cervical, supraclavicular, and axillary nodes normal. Neurologic: Alert and oriented X 3, normal strength and tone. Normal symmetric reflexes. Normal coordination and gait    Assessment:    Healthy female exam.      Plan:     ghm utd Check labs  See After Visit Summary for Counseling Recommendations    1. Hyperlipidemia, unspecified hyperlipidemia type Encouraged heart healthy diet, increase exercise, avoid trans fats, consider a krill oil cap daily  - ezetimibe (ZETIA) 10 MG tablet; 1 po qd  Dispense: 90 tablet; Refill: 0 - rosuvastatin (CRESTOR) 40 MG tablet; 1 po qd  Dispense: 90 tablet; Refill: 1 - Lipid panel  2. Gastroesophageal reflux disease stable - esomeprazole (NEXIUM) 40 MG capsule; Take 1 capsule (40 mg total) by mouth daily.  Dispense: 90 capsule; Refill: 3  3. Preventative health care See above - Lipid panel - CBC with Differential/Platelet - TSH - Comprehensive metabolic panel  4. Myalgia ? Statin vs back  Check labs  - CK (Creatine Kinase)  5. Leg cramps Check labs  - Magnesium - Phosphorus  6. Lumbar foraminal stenosis Refill meds Stable  - carisoprodol (SOMA) 350 MG tablet; Take 1 tablet (350 mg total) by mouth 4 (four) times daily as needed for muscle spasms.  Dispense: 60 tablet; Refill: 0  7. Chronic pain syndrome Stable uds /contract updated  - HYDROcodone-acetaminophen (NORCO/VICODIN) 5-325 MG tablet; Take 1 tablet by mouth every 6 (six) hours as needed for moderate pain.  Dispense: 30 tablet; Refill: 0  8. Colon cancer screening  - Ambulatory referral to Gastroenterology  9. Gastroesophageal reflux disease, unspecified whether esophagitis  present Worse with meds Refer to gi -- take ppi daily  - Ambulatory referral to Gastroenterology  10. High risk medication use  - DRUG MONITORING, PANEL 8 WITH CONFIRMATION, URINE

## 2020-02-28 NOTE — Telephone Encounter (Addendum)
Received call from the lab. Pt's UDS sample was not labelled correctly. Pt needs to return for recollection. Left message for pt to return my call. Needs lab appt for UDS only please.

## 2020-02-28 NOTE — Patient Instructions (Signed)

## 2020-02-28 NOTE — Addendum Note (Signed)
Addended by: Kelle Darting A on: 02/28/2020 04:46 PM   Modules accepted: Orders

## 2020-02-29 NOTE — Telephone Encounter (Signed)
Left message for pt to return my call.

## 2020-03-01 ENCOUNTER — Other Ambulatory Visit: Payer: PRIVATE HEALTH INSURANCE

## 2020-03-01 ENCOUNTER — Other Ambulatory Visit: Payer: Self-pay

## 2020-03-01 DIAGNOSIS — Z79899 Other long term (current) drug therapy: Secondary | ICD-10-CM

## 2020-03-01 NOTE — Progress Notes (Signed)
No charge today. Pt was called to re-collect specimen.

## 2020-03-02 LAB — DRUG MONITORING, PANEL 8 WITH CONFIRMATION, URINE
6 Acetylmorphine: NEGATIVE ng/mL (ref <10)
Alcohol Metabolites: NEGATIVE ng/mL
Amphetamines: NEGATIVE ng/mL (ref <500)
Benzodiazepines: NEGATIVE ng/mL (ref <100)
Buprenorphine, Urine: NEGATIVE ng/mL (ref <5)
Cocaine Metabolite: NEGATIVE ng/mL (ref <150)
Creatinine: 37 mg/dL
MDMA: NEGATIVE ng/mL (ref <500)
Marijuana Metabolite: NEGATIVE ng/mL (ref <20)
Opiates: NEGATIVE ng/mL (ref <100)
Oxidant: NEGATIVE ug/mL
Oxycodone: NEGATIVE ng/mL (ref <100)
pH: 7.4 (ref 4.5–9.0)

## 2020-03-02 LAB — DM TEMPLATE

## 2020-03-30 ENCOUNTER — Other Ambulatory Visit: Payer: Self-pay | Admitting: Obstetrics and Gynecology

## 2020-03-30 DIAGNOSIS — Z1231 Encounter for screening mammogram for malignant neoplasm of breast: Secondary | ICD-10-CM

## 2020-04-13 ENCOUNTER — Ambulatory Visit
Admission: RE | Admit: 2020-04-13 | Discharge: 2020-04-13 | Disposition: A | Discharge status: Home / Self Care | Payer: PRIVATE HEALTH INSURANCE | Source: Ambulatory Visit | Attending: Obstetrics and Gynecology | Admitting: Obstetrics and Gynecology

## 2020-04-13 ENCOUNTER — Ambulatory Visit: Payer: PRIVATE HEALTH INSURANCE

## 2020-04-13 ENCOUNTER — Other Ambulatory Visit: Payer: Self-pay

## 2020-04-13 DIAGNOSIS — Z1231 Encounter for screening mammogram for malignant neoplasm of breast: Secondary | ICD-10-CM

## 2020-04-20 ENCOUNTER — Encounter: Payer: Self-pay | Admitting: Gastroenterology

## 2020-06-01 ENCOUNTER — Other Ambulatory Visit: Payer: Self-pay | Admitting: Family Medicine

## 2020-06-01 DIAGNOSIS — M48061 Spinal stenosis, lumbar region without neurogenic claudication: Secondary | ICD-10-CM

## 2020-06-02 NOTE — Telephone Encounter (Signed)
Requesting: Carisoprodol 350mg  Contract: 02/28/2020 UDS: 03/01/2020 Low risk Last Visit: 02/28/2020 Next Visit: 08/21/2020 Last Refill: 02/28/2020 #60 and 0RF Pt sig: 1 tab q4h prn  Please Advise

## 2020-06-07 ENCOUNTER — Ambulatory Visit (INDEPENDENT_AMBULATORY_CARE_PROVIDER_SITE_OTHER): Payer: PRIVATE HEALTH INSURANCE | Admitting: Gastroenterology

## 2020-06-07 ENCOUNTER — Encounter: Payer: Self-pay | Admitting: Gastroenterology

## 2020-06-07 VITALS — BP 180/90 | HR 68 | Ht 64.25 in | Wt 204.0 lb

## 2020-06-07 DIAGNOSIS — K219 Gastro-esophageal reflux disease without esophagitis: Secondary | ICD-10-CM

## 2020-06-07 DIAGNOSIS — Z1211 Encounter for screening for malignant neoplasm of colon: Secondary | ICD-10-CM

## 2020-06-07 MED ORDER — SUTAB 1479-225-188 MG PO TABS: 1.0000 | ORAL_TABLET | Freq: Once | ORAL | 0 refills | Status: DC

## 2020-06-07 NOTE — Progress Notes (Signed)
Referring Provider: Carollee Herter, Alferd Apa, * Primary Care Physician:  Carollee Herter, Alferd Apa, DO  Reason for Consultation:  Reflux   IMPRESSION:  GERD with ongoing symptoms despite PPI PRN No prior colon cancer screening BMI 34.74 Hypertension with blood pressure today of 180/90  PLAN: Reviewed GERD lifestyle modifications EGD with esophageal biopsies Colonoscopy for colon cancer screening Follow-up with Dr. Carollee Herter re: hypertension  Please see the "Patient Instructions" section for addition details about the plan.  HPI: Yvonne Sanders is a 59 y.o. female referred by Dr. Carollee Herter for further evaluation of worsening reflux despite Nexium. The history is obtained through the patient and review of her electronic health record. She has hypertension, hypercholesterolemia, cholecystectomy for gallstones, and glaucoma. 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   Reports intermittent heartburn if she goes too long without eating. Tries not to eat after 7pm. Symptoms controlled with Nexium PRN. No dysphonia, odynophagia, dysphagia, neck pain, sore throat, change in bowel habits, or blood in the stool. The patient understands that she was referred for an EGD.   No prior colon cancer screening.  No known family history of colon cancer or polyps. No family history of uterine/endometrial cancer, pancreatic cancer or gastric/stomach cancer.   Past Medical History:  Diagnosis Date  . Abnormal pap 1989   cryo  . Arthritis   . Chicken pox   . Endometriosis   . 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 Gallstones   . Glaucoma   . Headache(784.0)   . Hyperlipidemia   . Hypertension       . Migraine   . Positive TB test   . Tibia fracture    Right leg.      Past Surgical History:  Procedure Laterality Date  . APPENDECTOMY  75  . BREAST EXCISIONAL BIOPSY Right   . CHOLECYSTECTOMY  88  . DIAGNOSTIC LAPAROSCOPY  88   endometrious  . GLAUCOMA SURGERY Left  2019  . LUMBAR FUSION  08/2011   l 3-4  . LUMBAR FUSION     l 4-5  . RIGHT OOPHORECTOMY  75    Current Outpatient Medications  Medication Sig Dispense Refill  . ascorbic acid (VITAMIN C) 500 MG tablet Take 500 mg by mouth daily.    Marland Kitchen aspirin EC 81 MG tablet Take 1 tablet (81 mg total) by mouth daily. 90 tablet 3  . aspirin-acetaminophen-caffeine (EXCEDRIN MIGRAINE) 250-250-65 MG tablet Take 1 tablet by mouth every 6 (six) hours as needed for headache or migraine.    . Biotin 5000 MCG CAPS Take 5,000 mcg by mouth daily.      . Brinzolamide-Brimonidine (SIMBRINZA) 1-0.2 % SUSP Place 1 drop into both eyes 3 (three) times daily.     . Calcium Carbonate Antacid (TUMS PO) Take 1 tablet by mouth daily as needed (heartburn).     . carisoprodol (SOMA) 350 MG tablet TAKE 1 TABLET(350 MG) BY MOUTH FOUR TIMES DAILY AS NEEDED FOR MUSCLE SPASMS 60 tablet 0  . Cholecalciferol (VITAMIN D3) 2000 units TABS Take 1 tablet by mouth daily.    . cycloSPORINE (RESTASIS) 0.05 % ophthalmic emulsion Place 1 drop into both eyes 2 (two) times daily.    . diclofenac Sodium (VOLTAREN) 1 % GEL Apply 4 g topically 4 (four) times daily. 350 g 2  . esomeprazole (NEXIUM) 40 MG capsule Take 1 capsule (40 mg total) by mouth daily. 90 capsule 3  . ezetimibe (ZETIA) 10 MG  tablet 1 po qd 90 tablet 0  . folic acid (FOLVITE) 412 MCG tablet Take 400 mcg by mouth daily.      Marland Kitchen HYDROcodone-acetaminophen (NORCO/VICODIN) 5-325 MG tablet Take 1 tablet by mouth every 6 (six) hours as needed for moderate pain. 30 tablet 0  . ibuprofen (ADVIL) 200 MG tablet Take 600 mg by mouth as needed.    . Magnesium 100 MG CAPS Take 1 capsule by mouth daily.    . metoprolol succinate (TOPROL-XL) 50 MG 24 hr tablet TAKE 1 TABLET BY MOUTH EVERY DAY WITH OR IMMEDIATELY FOLLOWING A MEAL 90 tablet 2  . Multiple Vitamins-Minerals (MULTIVITAMINS THER. W/MINERALS) TABS Take 1 tablet by mouth daily.      . Netarsudil-Latanoprost (ROCKLATAN) 0.02-0.005 % SOLN  Apply to eye.    . rosuvastatin (CRESTOR) 40 MG tablet 1 po qd 90 tablet 1  . zinc gluconate 50 MG tablet Take 50 mg by mouth daily.     No current facility-administered medications for this visit.    Allergies as of 06/07/2020 - Review Complete 06/07/2020  Allergen Reaction Noted  . Nubain [nalbuphine hcl] Anaphylaxis 07/10/2011  . Corticosteroids Other (See Comments) 08/23/2011  . Dilaudid [hydromorphone hcl] Nausea And Vomiting 01/19/2013  . Nubain [nalbuphine hcl] Nausea And Vomiting 01/18/2013  . Oxycodone-acetaminophen Itching 07/20/2015  . Percodan [oxycodone-aspirin] Hives and Itching 07/10/2011  . Tomato Rash 08/23/2011    Family History  Problem Relation Age of Onset  . Hypertension Father   . Glaucoma Father   . Prostate cancer Father   . Renal Disease Father   . Macular degeneration Father   . Stroke Father   . Kidney disease Father   . Heart disease Father   . Hyperlipidemia Mother   . Hypertension Mother   . Breast cancer Maternal Grandmother   . Breast cancer Maternal Aunt   . Hypertension Sister   . Hyperlipidemia Sister   . Hypertension Brother   . Heart disease Brother   . Hypertension Sister   . Hypertension Brother   . Diabetes Paternal Aunt   . Diabetes Maternal Aunt   . Breast cancer Cousin     Social History   Socioeconomic History  . Marital status: Married    Spouse name: Not on file  . Number of children: 1  . Years of education: Not on file  . Highest education level: Not on file  Occupational History  . Occupation: Surgical coodinator  Tobacco Use  . Smoking status: Never Smoker  . Smokeless tobacco: Never Used  Vaping Use  . Vaping Use: Never used  Substance and Sexual Activity  . Alcohol use: Not Currently  . Drug use: No  . Sexual activity: Yes    Partners: Male    Birth control/protection: Post-menopausal  Other Topics Concern  . Not on file  Social History Narrative  . Not on file   Social Determinants of Health    Financial Resource Strain:   . Difficulty of Paying Living Expenses: Not on file  Food Insecurity:   . Worried About Charity fundraiser in the Last Year: Not on file  . Ran Out of Food in the Last Year: Not on file  Transportation Needs:   . Lack of Transportation (Medical): Not on file  . Lack of Transportation (Non-Medical): Not on file  Physical Activity:   . Days of Exercise per Week: Not on file  . Minutes of Exercise per Session: Not on file  Stress:   . Feeling  of Stress : Not on file  Social Connections:   . Frequency of Communication with Friends and Family: Not on file  . Frequency of Social Gatherings with Friends and Family: Not on file  . Attends Religious Services: Not on file  . Active Member of Clubs or Organizations: Not on file  . Attends Archivist Meetings: Not on file  . Marital Status: Not on file  Intimate Partner Violence:   . Fear of Current or Ex-Partner: Not on file  . Emotionally Abused: Not on file  . Physically Abused: Not on file  . Sexually Abused: Not on file    Review of Systems: 12 system ROS is negative except as noted above with the addition of allergies, back pain, vision changes, headaches, muscle cramps.   Physical Exam: General:   Alert,  well-nourished, pleasant and cooperative in NAD Head:  Normocephalic and atraumatic. Eyes:  Sclera clear, no icterus.   Conjunctiva pink. Ears:  Normal auditory acuity. Nose:  No deformity, discharge,  or lesions. Mouth:  No deformity or lesions.   Neck:  Supple; no masses or thyromegaly. Lungs:  Clear throughout to auscultation.   No wheezes. Heart:  Regular rate and rhythm; no murmurs. Abdomen:  Soft, nontender, nondistended, normal bowel sounds, no rebound or guarding. No hepatosplenomegaly.   Rectal:  Deferred  Msk:  Symmetrical. No boney deformities LAD: No inguinal or umbilical LAD Extremities:  No clubbing or edema. Neurologic:  Alert and  oriented x4;  grossly  nonfocal Skin:  Intact without significant lesions or rashes. Psych:  Alert and cooperative. Normal mood and affect.     Reyaan Thoma L. Tarri Glenn, MD, MPH 06/07/2020, 3:58 PM

## 2020-06-07 NOTE — Patient Instructions (Signed)
If you are age 59 or older, your body mass index should be between 23-30. Your Body mass index is 34.74 kg/m. If this is out of the aforementioned range listed, please consider follow up with your Primary Care Provider.  If you are age 1 or younger, your body mass index should be between 19-25. Your Body mass index is 34.74 kg/m. If this is out of the aformentioned range listed, please consider follow up with your Primary Care Provider.   We are giving you a handout today on Reflux for you to review.   You have been scheduled for an endoscopy and colonoscopy. Please follow the written instructions given to you at your visit today. Please pick up your prep supplies at the pharmacy within the next 1-3 days. If you use inhalers (even only as needed), please bring them with you on the day of your procedure.  Thank you for entrusting me with your care and for choosing [facility], Dr. Thornton Park

## 2020-06-09 ENCOUNTER — Telehealth: Payer: Self-pay | Admitting: *Deleted

## 2020-06-09 NOTE — Telephone Encounter (Signed)
-----   Message from Ann Held, DO sent at 06/08/2020  8:45 AM EDT ----- Have pt come in for bp check with nurse next week  ----- Message ----- From: Thornton Park, MD Sent: 06/07/2020   8:43 PM EDT To: Ann Held, DO  I enjoyed meeting Ashley Akin today. I wanted to alert you to her blood pressure today of 180/90. She was asymptomatic and surprised by the reading. Thanks for the opportunity to work with her.  Thornton Park [facility]

## 2020-06-12 NOTE — Telephone Encounter (Signed)
Pt called. VM left. Advised patient to schedule a nurse visit for this week to check blood pressure

## 2020-06-14 ENCOUNTER — Telehealth: Payer: Self-pay | Admitting: Gastroenterology

## 2020-06-14 ENCOUNTER — Other Ambulatory Visit: Payer: Self-pay

## 2020-06-14 MED ORDER — SUTAB 1479-225-188 MG PO TABS: 1.0000 | ORAL_TABLET | Freq: Once | ORAL | 0 refills | Status: AC

## 2020-06-14 NOTE — Progress Notes (Signed)
Resent Sutab to pharmacy

## 2020-06-14 NOTE — Telephone Encounter (Signed)
Patient called states she checked with pharmacy and they do not have the prep medication please resend

## 2020-06-19 NOTE — Telephone Encounter (Signed)
Please schedule at patient's convenience.   °

## 2020-06-19 NOTE — Telephone Encounter (Signed)
Patient is calling back to schedule appointment however, she needs an afternoon. Please call her back

## 2020-06-20 NOTE — Telephone Encounter (Signed)
lvm to schedule appt.  

## 2020-06-23 ENCOUNTER — Ambulatory Visit (INDEPENDENT_AMBULATORY_CARE_PROVIDER_SITE_OTHER): Payer: PRIVATE HEALTH INSURANCE | Admitting: Family Medicine

## 2020-06-23 ENCOUNTER — Other Ambulatory Visit: Payer: Self-pay

## 2020-06-23 VITALS — BP 148/82 | HR 60

## 2020-06-23 DIAGNOSIS — I10 Essential (primary) hypertension: Secondary | ICD-10-CM

## 2020-06-23 NOTE — Progress Notes (Signed)
Pt here for Blood pressure check per Roma Schanz, DO  Pt currently takes: Metoprolol 50 mg daily   Pt reports compliance with medication.  BP today @ =148/ 82 HR =60   BP Readings from Last 3 Encounters:  06/07/20 (!) 180/90  02/28/20 110/82  09/22/19 138/88    Pt advised per Dr. Etter Sjogren, patient should continue current regimen, monitor blood pressure at home-notify any changes or concerns. Patient is scheduled for follow up in December. Will recheck then.

## 2020-06-27 ENCOUNTER — Encounter: Payer: Self-pay | Admitting: Gastroenterology

## 2020-06-29 ENCOUNTER — Ambulatory Visit (AMBULATORY_SURGERY_CENTER): Payer: PRIVATE HEALTH INSURANCE | Admitting: Gastroenterology

## 2020-06-29 ENCOUNTER — Other Ambulatory Visit: Payer: Self-pay

## 2020-06-29 ENCOUNTER — Encounter: Payer: Self-pay | Admitting: Gastroenterology

## 2020-06-29 VITALS — BP 177/88 | HR 72 | Temp 97.8°F | Resp 14 | Ht 64.25 in | Wt 204.0 lb

## 2020-06-29 DIAGNOSIS — Z1211 Encounter for screening for malignant neoplasm of colon: Secondary | ICD-10-CM

## 2020-06-29 DIAGNOSIS — K2100 Gastro-esophageal reflux disease with esophagitis, without bleeding: Secondary | ICD-10-CM | POA: Diagnosis not present

## 2020-06-29 DIAGNOSIS — K219 Gastro-esophageal reflux disease without esophagitis: Secondary | ICD-10-CM | POA: Diagnosis not present

## 2020-06-29 DIAGNOSIS — D120 Benign neoplasm of cecum: Secondary | ICD-10-CM

## 2020-06-29 DIAGNOSIS — K2990 Gastroduodenitis, unspecified, without bleeding: Secondary | ICD-10-CM

## 2020-06-29 DIAGNOSIS — K2970 Gastritis, unspecified, without bleeding: Secondary | ICD-10-CM

## 2020-06-29 DIAGNOSIS — K449 Diaphragmatic hernia without obstruction or gangrene: Secondary | ICD-10-CM

## 2020-06-29 DIAGNOSIS — K2950 Unspecified chronic gastritis without bleeding: Secondary | ICD-10-CM | POA: Diagnosis not present

## 2020-06-29 MED ORDER — SODIUM CHLORIDE 0.9 % IV SOLN: 500.0000 mL | Freq: Once | INTRAVENOUS | Status: DC

## 2020-06-29 MED ORDER — ESOMEPRAZOLE MAGNESIUM 40 MG PO CPDR
DELAYED_RELEASE_CAPSULE | ORAL | 0 refills | Status: DC
Start: 2020-06-29 — End: 2021-09-24

## 2020-06-29 NOTE — Patient Instructions (Signed)
Handouts on hiatal hernia, gastritis and polyps given to you today  Await pathology results from Dr. Tarri Glenn  Increase Nexium to 40mg  twice a day for ten weeks, then 40 mg once a day for one week, and then resume 40mg  as needed  Do not take aspirin, ibuprofen, naproxen or other NSAIDS; tylenol is okay to take   [place]:   Refer to the procedure report that was given to you for any specific questions about what was found during the examination.  If the procedure report does not answer your questions, please call your gastroenterologist to clarify.  If you requested that your care partner not be given the details of your procedure findings, then the procedure report has been included in a sealed envelope for you to review at your convenience later.  YOU SHOULD EXPECT: Some feelings of bloating in the abdomen. Passage of more gas than usual.  Walking can help get rid of the air that was put into your GI tract during the procedure and reduce the bloating. If you had a lower endoscopy (such as a colonoscopy or flexible sigmoidoscopy) you may notice spotting of blood in your stool or on the toilet paper. If you underwent a bowel prep for your procedure, you may not have a normal bowel movement for a few days.  Please Note:  You might notice some irritation and congestion in your nose or some drainage.  This is from the oxygen used during your procedure.  There is no need for concern and it should clear up in a day or so.  SYMPTOMS TO REPORT IMMEDIATELY:   Following lower endoscopy (colonoscopy or flexible sigmoidoscopy):  Excessive amounts of blood in the stool  Significant tenderness or worsening of abdominal pains  Swelling of the abdomen that is new, acute  Fever of 100F or higher   Following upper endoscopy (EGD)  Vomiting of blood or coffee ground material  New chest pain or pain under the shoulder blades  Painful or persistently  difficult swallowing  New shortness of breath  Fever of 100F or higher  Black, tarry-looking stools  For urgent or emergent issues, a gastroenterologist can be reached at any hour by calling [phone]. Do not use MyChart messaging for urgent concerns.    DIET:  We do recommend a small meal at first, but then you may proceed to your regular diet.  Drink plenty of fluids but you should avoid alcoholic beverages for 24 hours.  ACTIVITY:  You should plan to take it easy for the rest of today and you should NOT DRIVE or use heavy machinery until tomorrow (because of the sedation medicines used during the test).    FOLLOW UP: Our staff will call the number listed on your records 48-72 hours following your procedure to check on you and address any questions or concerns that you may have regarding the information given to you following your procedure. If we do not reach you, we will leave a message.  We will attempt to reach you two times.  During this call, we will ask if you have developed any symptoms of COVID 19. If you develop any symptoms (ie: fever, flu-like symptoms, shortness of breath, cough etc.) before then, please call [phone].  If you test positive for Covid 19 in the 2 weeks post procedure, please call and report this information to [place].    If any biopsies were taken you will be contacted by  phone or by letter within the next 1-3 weeks.  Please call us at [phone] if you have not heard about the biopsies in 3 weeks.    SIGNATURES/CONFIDENTIALITY: You and/or your care partner have signed paperwork which will be entered into your electronic medical record.  These signatures attest to the fact that that the information above on your After Visit Summary has been reviewed and is understood.  Full responsibility of the confidentiality of this discharge information lies with you and/or your care-partner.

## 2020-06-29 NOTE — Progress Notes (Signed)
Report to PACU, RN, vss, BBS= Clear.  

## 2020-06-29 NOTE — Op Note (Signed)
[place] Patient Name: Aziah Kaiser Procedure Date: 06/29/2020 12:53 PM MRN: [id number] Endoscopist: Thornton Park MD, MD Age: 59 Referring MD:  Date of Birth: July 22, 1961 Gender: Female Account #: [id number] Procedure:                Colonoscopy Indications:              Screening for colorectal malignant neoplasm, This                            is the patient's first colonoscopy                           No known family history of colon cancer or polyps Medicines:                Monitored Anesthesia Care Procedure:                Pre-Anesthesia Assessment:                           - Prior to the procedure, a History and Physical                            was performed, and patient medications and                            allergies were reviewed. The patient's tolerance of                            previous anesthesia was also reviewed. The risks                            and benefits of the procedure and the sedation                            options and risks were discussed with the patient.                            All questions were answered, and informed consent                            was obtained. Prior Anticoagulants: The patient has                            taken no previous anticoagulant or antiplatelet                            agents. ASA Grade Assessment: II - A patient with                            mild systemic disease. After reviewing the risks                            and benefits, the patient was deemed in  satisfactory condition to undergo the procedure.                           After obtaining informed consent, the colonoscope                            was passed under direct vision. Throughout the                            procedure, the patient's blood pressure, pulse, and                            oxygen saturations were monitored continuously. The                            Colonoscope was  introduced through the anus and                            advanced to the 3 cm into the ileum. A second                            forward view of the right colon was performed. The                            colonoscopy was performed without difficulty. The                            patient tolerated the procedure well. The quality                            of the bowel preparation was excellent. The                            terminal ileum, ileocecal valve, appendiceal                            orifice, and rectum were photographed. Scope In: 1:37:26 PM Scope Out: 1:52:49 PM Scope Withdrawal Time: 0 hours 11 minutes 40 seconds  Total Procedure Duration: 0 hours 15 minutes 23 seconds  Findings:                 The perianal and digital rectal examinations were                            normal.                           Two sessile polyps were found in the cecum. The                            polyps were 1 to 2 mm in size. These polyps were                            removed with a cold snare. Resection and retrieval  were complete. Estimated blood loss was minimal.                           The exam was otherwise without abnormality on                            direct and retroflexion views. Complications:            No immediate complications. Estimated blood loss:                            Minimal. Estimated Blood Loss:     Estimated blood loss was minimal. Impression:               - Two 1 to 2 mm polyps in the cecum, removed with a                            cold snare. Resected and retrieved.                           - The examination was otherwise normal on direct                            and retroflexion views. Recommendation:           - Patient has a contact number available for                            emergencies. The signs and symptoms of potential                            delayed complications were discussed with the                             patient. Return to normal activities tomorrow.                            Written discharge instructions were provided to the                            patient.                           - Resume previous diet.                           - Continue present medications.                           - Await pathology results.                           - Repeat colonoscopy date to be determined after                            pending pathology results are reviewed for  surveillance.                           - Emerging evidence supports eating a diet of                            fruits, vegetables, grains, calcium, and yogurt                            while reducing red meat and alcohol may reduce the                            risk of colon cancer.                           - Thank you for allowing me to be involved in your                            colon cancer prevention. Thornton Park MD, MD 06/29/2020 2:03:50 PM This report has been signed electronically.

## 2020-06-29 NOTE — Progress Notes (Signed)
Called to room to assist during endoscopic procedure.  Patient ID and intended procedure confirmed with present staff. Received instructions for my participation in the procedure from the performing physician.  

## 2020-06-29 NOTE — Op Note (Signed)
[place] Patient Name: Yvonne Sanders Procedure Date: 06/29/2020 12:54 PM MRN: [id number] Endoscopist: Thornton Park MD, MD Age: 59 Referring MD:  Date of Birth: 1961/04/23 Gender: Female Account #: [id number] Procedure:                Upper GI endoscopy Indications:              Suspected esophageal reflux Medicines:                Monitored Anesthesia Care Procedure:                Pre-Anesthesia Assessment:                           - Prior to the procedure, a History and Physical                            was performed, and patient medications and                            allergies were reviewed. The patient's tolerance of                            previous anesthesia was also reviewed. The risks                            and benefits of the procedure and the sedation                            options and risks were discussed with the patient.                            All questions were answered, and informed consent                            was obtained. Prior Anticoagulants: The patient has                            taken no previous anticoagulant or antiplatelet                            agents. ASA Grade Assessment: II - A patient with                            mild systemic disease. After reviewing the risks                            and benefits, the patient was deemed in                            satisfactory condition to undergo the procedure.                           After obtaining informed consent, the endoscope was  passed under direct vision. Throughout the                            procedure, the patient's blood pressure, pulse, and                            oxygen saturations were monitored continuously. The                            Endoscope was introduced through the mouth, and                            advanced to the third part of duodenum. The upper                            GI endoscopy was  accomplished without difficulty.                            The patient tolerated the procedure well. Scope In: Scope Out: Findings:                 LA Grade A (one or more mucosal breaks less than 5                            mm, not extending between tops of 2 mucosal folds)                            esophagitis with no bleeding was found 38 cm from                            the incisors. Biopsies were taken from the                            mid/proximal and distal esophagus with a cold                            forceps for histology. Estimated blood loss was                            minimal.                           Patchy moderate inflammation characterized by                            erythema, friability and granularity was found in                            the gastric body. Biopsies were taken from the                            antrum, body, and fundus with a cold forceps for  histology. Estimated blood loss was minimal.                           A small hiatal hernia was present.                           The examined duodenum was normal.                           The cardia and gastric fundus were normal on                            retroflexion.                           The exam was otherwise without abnormality. Complications:            No immediate complications. Estimated blood loss:                            Minimal. Estimated Blood Loss:     Estimated blood loss was minimal. Impression:               - LA Grade A reflux esophagitis with no bleeding.                            Biopsied.                           - Gastritis. Biopsied.                           - Small hiatal hernia.                           - Normal examined duodenum.                           - The examination was otherwise normal. Recommendation:           - Patient has a contact number available for                            emergencies. The signs and  symptoms of potential                            delayed complications were discussed with the                            patient. Return to normal activities tomorrow.                            Written discharge instructions were provided to the                            patient.                           -  Resume previous diet.                           - Continue present medications. I recommend using                            Nexium 40 mg BID x 10 weeks, then reducing to 40 mg                            QD x 1 week, then resuming Nexium 40 mg PRN.                           - No aspirin, ibuprofen, naproxen, or other                            non-steroidal anti-inflammatory drugs.                           - Await pathology results. Thornton Park MD, MD 06/29/2020 2:00:06 PM This report has been signed electronically.

## 2020-06-29 NOTE — Progress Notes (Signed)
Vitals by CW 

## 2020-06-30 ENCOUNTER — Telehealth: Payer: Self-pay

## 2020-06-30 NOTE — Telephone Encounter (Signed)
Received notification from [facility] re: need for PA for Nexium. Spoke with Dr. Tarri Glenn to determine if pt needs to purchase OTC, change to covered alternative or proceed with PA. Per Dr. Tarri Glenn, no need to proceed with PA, any covered alternative will be appropriate. Called pt to make her aware of Dr. Tarri Glenn response and to advise that she call insurance to determine a covered alternative. LVM requesting returned call.

## 2020-07-03 ENCOUNTER — Telehealth: Payer: Self-pay

## 2020-07-03 NOTE — Telephone Encounter (Signed)
  Follow up Call-  Call back number 06/29/2020  Post procedure Call Back phone  # [phone]  Permission to leave phone message Yes  Some recent data might be hidden     Patient questions:  Do you have a fever, pain , or abdominal swelling? No. Pain Score  0 *  Have you tolerated food without any problems? Yes.    Have you been able to return to your normal activities? Yes.    Do you have any questions about your discharge instructions: Diet   No. Medications  No. Follow up visit  No.  Do you have questions or concerns about your Care? No.  Actions: * If pain score is 4 or above: No action needed, pain <4.  1. Have you developed a fever since your procedure? no  2.   Have you had an respiratory symptoms (SOB or cough) since your procedure? no  3.   Have you tested positive for COVID 19 since your procedure no  4.   Have you had any family members/close contacts diagnosed with the COVID 19 since your procedure?  no   If yes to any of these questions please route to Joylene John, RN and Joella Prince, RN

## 2020-07-07 ENCOUNTER — Other Ambulatory Visit: Payer: Self-pay

## 2020-07-07 ENCOUNTER — Other Ambulatory Visit: Payer: Self-pay | Admitting: Interventional Cardiology

## 2020-07-07 DIAGNOSIS — E785 Hyperlipidemia, unspecified: Secondary | ICD-10-CM

## 2020-08-21 ENCOUNTER — Ambulatory Visit: Payer: PRIVATE HEALTH INSURANCE | Admitting: Family Medicine

## 2020-08-21 ENCOUNTER — Encounter: Payer: Self-pay | Admitting: Family Medicine

## 2020-08-21 ENCOUNTER — Ambulatory Visit (INDEPENDENT_AMBULATORY_CARE_PROVIDER_SITE_OTHER): Payer: PRIVATE HEALTH INSURANCE | Admitting: Family Medicine

## 2020-08-21 ENCOUNTER — Other Ambulatory Visit: Payer: Self-pay

## 2020-08-21 VITALS — BP 110/70 | HR 59 | Temp 98.3°F | Resp 18 | Ht 64.25 in | Wt 202.8 lb

## 2020-08-21 DIAGNOSIS — R079 Chest pain, unspecified: Secondary | ICD-10-CM | POA: Insufficient documentation

## 2020-08-21 DIAGNOSIS — I10 Essential (primary) hypertension: Secondary | ICD-10-CM

## 2020-08-21 DIAGNOSIS — G894 Chronic pain syndrome: Secondary | ICD-10-CM | POA: Insufficient documentation

## 2020-08-21 DIAGNOSIS — E785 Hyperlipidemia, unspecified: Secondary | ICD-10-CM

## 2020-08-21 DIAGNOSIS — M48061 Spinal stenosis, lumbar region without neurogenic claudication: Secondary | ICD-10-CM

## 2020-08-21 DIAGNOSIS — G8929 Other chronic pain: Secondary | ICD-10-CM

## 2020-08-21 DIAGNOSIS — M25561 Pain in right knee: Secondary | ICD-10-CM

## 2020-08-21 DIAGNOSIS — K219 Gastro-esophageal reflux disease without esophagitis: Secondary | ICD-10-CM

## 2020-08-21 DIAGNOSIS — G43509 Persistent migraine aura without cerebral infarction, not intractable, without status migrainosus: Secondary | ICD-10-CM

## 2020-08-21 DIAGNOSIS — G43909 Migraine, unspecified, not intractable, without status migrainosus: Secondary | ICD-10-CM | POA: Insufficient documentation

## 2020-08-21 DIAGNOSIS — M25562 Pain in left knee: Secondary | ICD-10-CM

## 2020-08-21 LAB — CBC WITH DIFFERENTIAL/PLATELET
Basophils Absolute: 0 10*3/uL (ref 0.0–0.1)
Basophils Relative: 0.6 % (ref 0.0–3.0)
Eosinophils Absolute: 0 10*3/uL (ref 0.0–0.7)
Eosinophils Relative: 0.7 % (ref 0.0–5.0)
HCT: 36 % (ref 36.0–46.0)
Hemoglobin: 11.5 g/dL — ABNORMAL LOW (ref 12.0–15.0)
Lymphocytes Relative: 29.3 % (ref 12.0–46.0)
Lymphs Abs: 1.5 10*3/uL (ref 0.7–4.0)
MCHC: 31.9 g/dL (ref 30.0–36.0)
MCV: 85.4 fl (ref 78.0–100.0)
Monocytes Absolute: 0.3 10*3/uL (ref 0.1–1.0)
Monocytes Relative: 5.1 % (ref 3.0–12.0)
Neutro Abs: 3.3 10*3/uL (ref 1.4–7.7)
Neutrophils Relative %: 64.3 % (ref 43.0–77.0)
Platelets: 187 10*3/uL (ref 150.0–400.0)
RBC: 4.21 Mil/uL (ref 3.87–5.11)
RDW: 12.9 % (ref 11.5–15.5)
WBC: 5.1 10*3/uL (ref 4.0–10.5)

## 2020-08-21 LAB — COMPREHENSIVE METABOLIC PANEL
ALT: 30 U/L (ref 0–35)
AST: 23 U/L (ref 0–37)
Albumin: 4.1 g/dL (ref 3.5–5.2)
Alkaline Phosphatase: 70 U/L (ref 39–117)
BUN: 12 mg/dL (ref 6–23)
CO2: 26 mEq/L (ref 19–32)
Calcium: 9.1 mg/dL (ref 8.4–10.5)
Chloride: 109 mEq/L (ref 96–112)
Creatinine, Ser: 0.67 mg/dL (ref 0.40–1.20)
GFR: 95.78 mL/min (ref >60.00)
Glucose, Bld: 92 mg/dL (ref 70–99)
Potassium: 4.2 mEq/L (ref 3.5–5.1)
Sodium: 141 mEq/L (ref 135–145)
Total Bilirubin: 0.4 mg/dL (ref 0.2–1.2)
Total Protein: 6.8 g/dL (ref 6.0–8.3)

## 2020-08-21 LAB — LIPID PANEL
Cholesterol: 184 mg/dL (ref 0–200)
HDL: 70.1 mg/dL (ref >39.00)
LDL Cholesterol: 101 mg/dL — ABNORMAL HIGH (ref 0–99)
NonHDL: 114.26
Total CHOL/HDL Ratio: 3
Triglycerides: 66 mg/dL (ref 0.0–149.0)
VLDL: 13.2 mg/dL (ref 0.0–40.0)

## 2020-08-21 LAB — VITAMIN B12: Vitamin B-12: 1505 pg/mL — ABNORMAL HIGH (ref 211–911)

## 2020-08-21 MED ORDER — ESOMEPRAZOLE MAGNESIUM 40 MG PO CPDR: 40.0000 mg | DELAYED_RELEASE_CAPSULE | Freq: Every day | ORAL | 3 refills | Status: DC

## 2020-08-21 MED ORDER — METOPROLOL SUCCINATE ER 50 MG PO TB24: ORAL_TABLET | ORAL | 2 refills | Status: DC

## 2020-08-21 MED ORDER — ROSUVASTATIN CALCIUM 40 MG PO TABS: ORAL_TABLET | ORAL | 1 refills | Status: DC

## 2020-08-21 MED ORDER — EZETIMIBE 10 MG PO TABS: 10.0000 mg | ORAL_TABLET | Freq: Every day | ORAL | 1 refills | Status: DC

## 2020-08-21 MED ORDER — CARISOPRODOL 350 MG PO TABS: ORAL_TABLET | ORAL | 0 refills | Status: DC

## 2020-08-21 MED ORDER — HYDROCODONE-ACETAMINOPHEN 5-325 MG PO TABS: 1.0000 | ORAL_TABLET | Freq: Four times a day (QID) | ORAL | 0 refills | Status: DC | PRN

## 2020-08-21 MED ORDER — DICLOFENAC SODIUM 1 % EX GEL: 4.0000 g | Freq: Four times a day (QID) | CUTANEOUS | 2 refills | Status: DC

## 2020-08-21 NOTE — Assessment & Plan Note (Signed)
Suspect facial numbness is her typical migraine rto if symptoms worsen or become more frequent

## 2020-08-21 NOTE — Patient Instructions (Signed)

## 2020-08-21 NOTE — Assessment & Plan Note (Signed)
Well controlled, no changes to meds. Encouraged heart healthy diet such as the DASH diet and exercise as tolerated.  °

## 2020-08-21 NOTE — Assessment & Plan Note (Signed)
ekg normal Check labs

## 2020-08-21 NOTE — Assessment & Plan Note (Signed)
Database reviewed Uds/ contract utd

## 2020-08-21 NOTE — Assessment & Plan Note (Signed)
Encouraged heart healthy diet, increase exercise, avoid trans fats, consider a krill oil cap daily 

## 2020-08-21 NOTE — Assessment & Plan Note (Signed)
?   If that is part of the pain she is experiencing in chest  nexium refilled

## 2020-08-21 NOTE — Progress Notes (Signed)
Patient ID: Yvonne Sanders, female    DOB: 29-Jun-1961  Age: 59 y.o. MRN: [id number]    Subjective:  Subjective  HPI Yvonne Sanders presents for f/u bp , chol and bs.   Pt also c/o atypical chest pain --- it occurred 2x -- first time was last year and she mentioned it to cardiology but they did not think it was anything to worry about.  It occurred again yesterday -- only last a few seconds but was worse than the first time --- it started in the groin and then had a twinge in the chest and then the left side neck and numbness --- but was gone in a second and then she had a headache-- she has a hx migraines that occur with an aura of numbness in face.  No sob, no palpitations ,no weakness or slurred speech   Review of Systems  Constitutional: Negative for appetite change, diaphoresis, fatigue and unexpected weight change.  Eyes: Negative for pain, redness and visual disturbance.  Respiratory: Negative for cough, chest tightness, shortness of breath and wheezing.   Cardiovascular: Negative for chest pain, palpitations and leg swelling.  Endocrine: Negative for cold intolerance, heat intolerance, polydipsia, polyphagia and polyuria.  Genitourinary: Negative for difficulty urinating, dysuria and frequency.  Neurological: Positive for numbness and headaches. Negative for dizziness, syncope, facial asymmetry, speech difficulty, weakness and light-headedness.    History Past Medical History:  Diagnosis Date  . Abnormal pap 1989   cryo  . Arthritis   . Cataract   . Chicken pox   . Endometriosis   . 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 Gallstones   . GERD (gastroesophageal reflux disease)   . Glaucoma   . Headache(784.0)   . Hyperlipidemia   . Hypertension       . Migraine   . Positive TB test   . Tibia fracture    Right leg.      She has a past surgical history that includes Cholecystectomy (88); Diagnostic laparoscopy (88); Appendectomy (75); Right oophorectomy (75); Lumbar  fusion (08/2011); Lumbar fusion; Breast excisional biopsy (Right); and Glaucoma surgery (Left, 2019).   Her family history includes Breast cancer in her cousin, maternal aunt, and maternal grandmother; Diabetes in her maternal aunt and paternal aunt; Glaucoma in her father; Heart disease in her brother and father; Hyperlipidemia in her mother and sister; Hypertension in her brother, brother, father, mother, sister, and sister; Kidney disease in her father; Macular degeneration in her father; Prostate cancer in her father; Renal Disease in her father; Stroke in her father.She reports that she has never smoked. She has never used smokeless tobacco. She reports previous alcohol use. She reports that she does not use drugs.  Current Outpatient Medications on File Prior to Visit  Medication Sig Dispense Refill  . ascorbic acid (VITAMIN C) 500 MG tablet Take 500 mg by mouth daily.    Marland Kitchen aspirin EC 81 MG tablet Take 1 tablet (81 mg total) by mouth daily. 90 tablet 3  . aspirin-acetaminophen-caffeine (EXCEDRIN MIGRAINE) 250-250-65 MG tablet Take 1 tablet by mouth every 6 (six) hours as needed for headache or migraine.    . Biotin 5000 MCG CAPS Take 5,000 mcg by mouth daily.    . Brinzolamide-Brimonidine 1-0.2 % SUSP Place 1 drop into both eyes 3 (three) times daily.     . Calcium Carbonate Antacid (TUMS PO) Take 1 tablet by mouth daily as needed (heartburn).     . Cholecalciferol (VITAMIN  D3) 2000 units TABS Take 1 tablet by mouth daily.    . cycloSPORINE (RESTASIS) 0.05 % ophthalmic emulsion Place 1 drop into both eyes 2 (two) times daily.    Marland Kitchen esomeprazole (NEXIUM) 40 MG capsule Take 1 capsule (40 mg total) by mouth 2 (two) times daily before a meal for 70 days, THEN 1 capsule (40 mg total) daily at 12 noon for 7 days. 759 capsule 0  . folic acid (FOLVITE) 163 MCG tablet Take 400 mcg by mouth daily.    Marland Kitchen ibuprofen (ADVIL) 200 MG tablet Take 600 mg by mouth as needed.    . Magnesium 100 MG CAPS Take 1  capsule by mouth daily.    . Multiple Vitamins-Minerals (MULTIVITAMINS THER. W/MINERALS) TABS Take 1 tablet by mouth daily.    . Netarsudil-Latanoprost 0.02-0.005 % SOLN Apply to eye.    . zinc gluconate 50 MG tablet Take 50 mg by mouth daily.     No current facility-administered medications on file prior to visit.     Objective:  Objective  Physical Exam Vitals and nursing note reviewed.  Constitutional:      Appearance: She is well-developed and well-nourished.  HENT:     Head: Normocephalic and atraumatic.  Eyes:     Extraocular Movements: EOM normal.     Conjunctiva/sclera: Conjunctivae normal.  Neck:     Thyroid: No thyromegaly.     Vascular: No carotid bruit or JVD.  Cardiovascular:     Rate and Rhythm: Normal rate and regular rhythm.     Heart sounds: Normal heart sounds. No murmur heard.   Pulmonary:     Effort: Pulmonary effort is normal. No respiratory distress.     Breath sounds: Normal breath sounds. No wheezing or rales.  Chest:     Chest wall: No tenderness.  Musculoskeletal:        General: No swelling, tenderness, deformity, signs of injury or edema.     Cervical back: Normal range of motion and neck supple.     Right lower leg: No edema.     Left lower leg: No edema.  Neurological:     General: No focal deficit present.     Mental Status: She is alert and oriented to person, place, and time.     Cranial Nerves: No cranial nerve deficit.     Sensory: No sensory deficit.     Motor: No weakness.     Gait: Gait normal.     Deep Tendon Reflexes: Reflexes normal.  Psychiatric:        Mood and Affect: Mood and affect normal.    BP 110/70 (BP Location: Left Arm, Patient Position: Sitting, Cuff Size: Large)   Pulse (!) 59   Temp 98.3 F (36.8 C) (Oral)   Resp 18   Ht 5' 4.25" (1.632 m)   Wt 202 lb 12.8 oz (92 kg)   LMP 09/03/2007 (Approximate)   SpO2 99%   BMI 34.54 kg/m  Wt Readings from Last 3 Encounters:  08/21/20 202 lb 12.8 oz (92 kg)   06/29/20 204 lb (92.5 kg)  06/07/20 204 lb (92.5 kg)     Lab Results  Component Value Date   WBC 5.6 02/28/2020   HGB 12.2 02/28/2020   HCT 38.4 02/28/2020   PLT 199.0 02/28/2020   GLUCOSE 98 02/28/2020   CHOL 180 02/28/2020   TRIG 73.0 02/28/2020   HDL 64.60 02/28/2020   LDLCALC 101 (H) 02/28/2020   ALT 52 (H) 02/28/2020   AST  34 02/28/2020   NA 140 02/28/2020   K 3.9 02/28/2020   CL 105 02/28/2020   CREATININE 0.68 02/28/2020   BUN 10 02/28/2020   CO2 26 02/28/2020   TSH 0.84 02/28/2020   EKG-- sinus brady  MM DIGITAL SCREENING BILATERAL  Result Date: 04/14/2020 CLINICAL DATA:  Screening. EXAM: DIGITAL SCREENING BILATERAL MAMMOGRAM WITH CAD COMPARISON:  Previous exam(s). ACR Breast Density Category b: There are scattered areas of fibroglandular density. FINDINGS: There are no findings suspicious for malignancy. Images were processed with CAD. IMPRESSION: No mammographic evidence of malignancy. A result letter of this screening mammogram will be mailed directly to the patient. RECOMMENDATION: Screening mammogram in one year. (Code:SM-B-01Y) BI-RADS CATEGORY  1: Negative. Electronically Signed   By: Dorise Bullion III M.D   On: 04/14/2020 14:44     Assessment & Plan:  Plan  I am having Federica B. Faulks maintain her multivitamins ther. w/minerals, folic acid, Biotin, Calcium Carbonate Antacid (TUMS PO), aspirin-acetaminophen-caffeine, Brinzolamide-Brimonidine, Vitamin D3, aspirin EC, Netarsudil-Latanoprost, cycloSPORINE, zinc gluconate, ascorbic acid, Magnesium, ibuprofen, esomeprazole, ezetimibe, HYDROcodone-acetaminophen, metoprolol succinate, rosuvastatin, carisoprodol, diclofenac Sodium, and esomeprazole.  Meds ordered this encounter  Medications  . ezetimibe (ZETIA) 10 MG tablet    Sig: Take 1 tablet (10 mg total) by mouth daily. TAKE 1 TABLET(10 MG) BY MOUTH DAILY    Dispense:  90 tablet    Refill:  1  . HYDROcodone-acetaminophen (NORCO/VICODIN) 5-325 MG tablet     Sig: Take 1 tablet by mouth every 6 (six) hours as needed for moderate pain.    Dispense:  30 tablet    Refill:  0  . metoprolol succinate (TOPROL-XL) 50 MG 24 hr tablet    Sig: TAKE 1 TABLET BY MOUTH EVERY DAY WITH OR IMMEDIATELY FOLLOWING A MEAL    Dispense:  90 tablet    Refill:  2  . rosuvastatin (CRESTOR) 40 MG tablet    Sig: 1 po qd    Dispense:  90 tablet    Refill:  1  . carisoprodol (SOMA) 350 MG tablet    Sig: TAKE 1 TABLET(350 MG) BY MOUTH FOUR TIMES DAILY AS NEEDED FOR MUSCLE SPASMS    Dispense:  60 tablet    Refill:  0  . diclofenac Sodium (VOLTAREN) 1 % GEL    Sig: Apply 4 g topically 4 (four) times daily.    Dispense:  350 g    Refill:  2  . esomeprazole (NEXIUM) 40 MG capsule    Sig: Take 1 capsule (40 mg total) by mouth daily.    Dispense:  90 capsule    Refill:  3    Problem List Items Addressed This Visit      Unprioritized   Chest pain    ekg normal Check labs       Relevant Orders   EKG 12-Lead (Completed)   CBC with Differential/Platelet   Vitamin B12   Chronic pain of both knees    Database reviewed Uds/ contract utd       Relevant Medications   HYDROcodone-acetaminophen (NORCO/VICODIN) 5-325 MG tablet   carisoprodol (SOMA) 350 MG tablet   diclofenac Sodium (VOLTAREN) 1 % GEL   Chronic pain syndrome   Relevant Medications   HYDROcodone-acetaminophen (NORCO/VICODIN) 5-325 MG tablet   carisoprodol (SOMA) 350 MG tablet   Gastroesophageal reflux disease    ? If that is part of the pain she is experiencing in chest  nexium refilled       Relevant Medications   esomeprazole (  NEXIUM) 40 MG capsule   HTN (hypertension) - Primary    Well controlled, no changes to meds. Encouraged heart healthy diet such as the DASH diet and exercise as tolerated.       Relevant Medications   ezetimibe (ZETIA) 10 MG tablet   metoprolol succinate (TOPROL-XL) 50 MG 24 hr tablet   rosuvastatin (CRESTOR) 40 MG tablet   Other Relevant Orders   Lipid panel    Comprehensive metabolic panel   EKG 22-VVKP (Completed)   Hyperlipidemia    Encouraged heart healthy diet, increase exercise, avoid trans fats, consider a krill oil cap daily      Relevant Medications   ezetimibe (ZETIA) 10 MG tablet   metoprolol succinate (TOPROL-XL) 50 MG 24 hr tablet   rosuvastatin (CRESTOR) 40 MG tablet   Other Relevant Orders   Lipid panel   Lumbar foraminal stenosis   Relevant Medications   carisoprodol (SOMA) 350 MG tablet   Migraine    Suspect facial numbness is her typical migraine rto if symptoms worsen or become more frequent       Relevant Medications   ezetimibe (ZETIA) 10 MG tablet   HYDROcodone-acetaminophen (NORCO/VICODIN) 5-325 MG tablet   metoprolol succinate (TOPROL-XL) 50 MG 24 hr tablet   rosuvastatin (CRESTOR) 40 MG tablet   carisoprodol (SOMA) 350 MG tablet      Follow-up: Return in about 6 months (around 02/19/2021) for annual exam, fasting.  Ann Held, DO

## 2020-09-26 NOTE — Progress Notes (Unsigned)
Cardiology Office Note:    Date:  09/29/2020   ID:  KAWTHAR OSUCH, DOB 1961/05/12, MRN [id number]  PCP:  Ann Held, DO  Cardiologist:  Sinclair Grooms, MD   Referring MD: Carollee Herter, Alferd Apa, *   Chief Complaint  Patient presents with  . Follow-up    Primary prevention    History of Present Illness:    Yvonne Sanders is a 60 y.o. female with a hx of  aortic valve sclerosis/mild stenosis, essential hypertension, and LVH.  Had a second episode of left groin discomfort that radiated up the mid axillary line to her chest and jaw.  Episode lasted seconds before resolving and was followed by a severe headache.  The headache that she had was a typical migraine.  The pain happened on one prior occasion several years ago.  She spoke to Dr. Etter Sjogren about this and an electrocardiogram was performed and did not demonstrate any significant change.  She has not been able to exercise routinely because of work and having to care for her father who is dependent upon her and his other daughter.  She has not had chest discomfort.  She was able to shovel snow recently without any significant problem.  She denies orthopnea and PND.  No palpitations.  She has not had peripheral edema.  Her current medication regimen is stable there are no side effects on Crestor, Toprol-XL 50 mg daily, Zetia, and aspirin 81 mg/day.  Past Medical History:  Diagnosis Date  . Abnormal pap 1989   cryo  . Arthritis   . Cataract   . Chicken pox   . Endometriosis   . 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 Gallstones   . GERD (gastroesophageal reflux disease)   . Glaucoma   . Headache(784.0)   . Hyperlipidemia   . Hypertension       . Migraine   . Positive TB test   . Tibia fracture    Right leg.      Past Surgical History:  Procedure Laterality Date  . APPENDECTOMY  75  . BREAST EXCISIONAL BIOPSY Right   . CHOLECYSTECTOMY  88  . DIAGNOSTIC LAPAROSCOPY  88   endometrious  . GLAUCOMA  SURGERY Left 2019  . LUMBAR FUSION  08/2011   l 3-4  . LUMBAR FUSION     l 4-5  . RIGHT OOPHORECTOMY  75    Current Medications: Current Meds  Medication Sig  . ascorbic acid (VITAMIN C) 500 MG tablet Take 500 mg by mouth daily.  Marland Kitchen aspirin EC 81 MG tablet Take 1 tablet (81 mg total) by mouth daily.  Marland Kitchen aspirin-acetaminophen-caffeine (EXCEDRIN MIGRAINE) 250-250-65 MG tablet Take 1 tablet by mouth every 6 (six) hours as needed for headache or migraine.  . Biotin 5000 MCG CAPS Take 5,000 mcg by mouth daily.  . Brinzolamide-Brimonidine 1-0.2 % SUSP Place 1 drop into both eyes 3 (three) times daily.   . Calcium Carbonate Antacid (TUMS PO) Take 1 tablet by mouth daily as needed (heartburn).   . carisoprodol (SOMA) 350 MG tablet TAKE 1 TABLET(350 MG) BY MOUTH FOUR TIMES DAILY AS NEEDED FOR MUSCLE SPASMS  . Cholecalciferol (VITAMIN D3) 2000 units TABS Take 1 tablet by mouth daily.  . cycloSPORINE (RESTASIS) 0.05 % ophthalmic emulsion Place 1 drop into both eyes 2 (two) times daily.  . diclofenac Sodium (VOLTAREN) 1 % GEL Apply 4 g topically 4 (four) times daily.  Marland Kitchen esomeprazole (NEXIUM) 40  MG capsule Take 1 capsule (40 mg total) by mouth 2 (two) times daily before a meal for 70 days, THEN 1 capsule (40 mg total) daily at 12 noon for 7 days.  Marland Kitchen esomeprazole (NEXIUM) 40 MG capsule Take 1 capsule (40 mg total) by mouth daily.  Marland Kitchen ezetimibe (ZETIA) 10 MG tablet Take 1 tablet (10 mg total) by mouth daily. TAKE 1 TABLET(10 MG) BY MOUTH DAILY  . folic acid (FOLVITE) 433 MCG tablet Take 400 mcg by mouth daily.  Marland Kitchen HYDROcodone-acetaminophen (NORCO/VICODIN) 5-325 MG tablet Take 1 tablet by mouth every 6 (six) hours as needed for moderate pain.  Marland Kitchen ibuprofen (ADVIL) 200 MG tablet Take 600 mg by mouth as needed.  . Magnesium 100 MG CAPS Take 1 capsule by mouth daily.  . metoprolol succinate (TOPROL-XL) 50 MG 24 hr tablet TAKE 1 TABLET BY MOUTH EVERY DAY WITH OR IMMEDIATELY FOLLOWING A MEAL  . Multiple  Vitamins-Minerals (MULTIVITAMINS THER. W/MINERALS) TABS Take 1 tablet by mouth daily.  . Netarsudil-Latanoprost 0.02-0.005 % SOLN Apply to eye.  . rosuvastatin (CRESTOR) 40 MG tablet 1 po qd  . zinc gluconate 50 MG tablet Take 50 mg by mouth daily.     Allergies:   Nubain [nalbuphine hcl], Corticosteroids, Dilaudid [hydromorphone hcl], Nubain [nalbuphine hcl], Oxycodone-acetaminophen, Percodan [oxycodone-aspirin], and Tomato   Social History   Socioeconomic History  . Marital status: Married    Spouse name: Not on file  . Number of children: 1  . Years of education: Not on file  . Highest education level: Not on file  Occupational History  . Occupation: Surgical coodinator  Tobacco Use  . Smoking status: Never Smoker  . Smokeless tobacco: Never Used  Vaping Use  . Vaping Use: Never used  Substance and Sexual Activity  . Alcohol use: Not Currently  . Drug use: No  . Sexual activity: Yes    Partners: Male    Birth control/protection: Post-menopausal  Other Topics Concern  . Not on file  Social History Narrative  . Not on file   Social Determinants of Health   Financial Resource Strain: Not on file  Food Insecurity: Not on file  Transportation Needs: Not on file  Physical Activity: Not on file  Stress: Not on file  Social Connections: Not on file     Family History: The patient's family history includes Breast cancer in her cousin, maternal aunt, and maternal grandmother; Diabetes in her maternal aunt and paternal aunt; Glaucoma in her father; Heart disease in her brother and father; Hyperlipidemia in her mother and sister; Hypertension in her brother, brother, father, mother, sister, and sister; Kidney disease in her father; Macular degeneration in her father; Prostate cancer in her father; Renal Disease in her father; Stroke in her father. There is no history of Colon cancer, Colon polyps, Stomach cancer, or Esophageal cancer.  ROS:   Please see the history of present  illness.    Has always been in concerned about her vascular health.  Not sure if she has ever had a hemoglobin A1c.  Her father is diabetic, end-stage kidney disease, hypertensive, and has CAD with prior bypass surgery following a myocardial infarction.  Headaches are relatively rare.  All other systems reviewed and are negative.  EKGs/Labs/Other Studies Reviewed:    The following studies were reviewed today:   LDL was 101 August 21, 2020.  Hemoglobin A1c was not performed.  BUN and creatinine were normal.  2D Doppler echocardiogram 2015 Study Conclusions   - Left ventricle:  The cavity size was normal. Wall thickness was  increased in a pattern of mild LVH. There was focal basal  hypertrophy. Systolic function was vigorous. The estimated  ejection fraction was in the range of 65% to 70%. Wall motion was  normal; there were no regional wall motion abnormalities. Doppler  parameters are consistent with abnormal left ventricular  relaxation (grade 1 diastolic dysfunction). There was no evidence  of elevated ventricular filling pressure by Doppler parameters.  - Aortic valve: Trileaflet; mildly thickened, mildly calcified  leaflets. Mobility was not restricted. Transvalvular velocity was  minimally increased. There was no stenosis. Mean gradient (S): 10  mm Hg. Peak gradient (S): 18 mm Hg.  - Aortic root: The aortic root was normal in size.  - Mitral valve: Structurally normal valve.  - Right ventricle: Systolic function was normal.  - Right atrium: The atrium was normal in size.  - Tricuspid valve: There was no regurgitation.  - Pericardium, extracardiac: There was no pericardial effusion.    EKG:  EKG performed by Dr. Etter Sjogren on August 21, 2020, demonstrating sinus bradycardia 54 bpm and otherwise unremarkable.  Recent Labs: 02/28/2020: Magnesium 1.9; TSH 0.84 08/21/2020: ALT 30; BUN 12; Creatinine, Ser 0.67; Hemoglobin 11.5; Platelets 187.0; Potassium 4.2;  Sodium 141  Recent Lipid Panel    Component Value Date/Time   CHOL 184 08/21/2020 0928   TRIG 66.0 08/21/2020 0928   HDL 70.10 08/21/2020 0928   CHOLHDL 3 08/21/2020 0928   VLDL 13.2 08/21/2020 0928   LDLCALC 101 (H) 08/21/2020 0928    Physical Exam:    VS:  BP 140/84 (BP Location: Left Arm, Patient Position: Sitting, Cuff Size: Normal)   Pulse 80   Ht 5' 4.25" (1.632 m)   Wt 204 lb (92.5 kg)   LMP 09/03/2007 (Approximate)   SpO2 97%   BMI 34.74 kg/m     Wt Readings from Last 3 Encounters:  09/29/20 204 lb (92.5 kg)  08/21/20 202 lb 12.8 oz (92 kg)  06/29/20 204 lb (92.5 kg)     GEN: Obese.. No acute distress HEENT: Normal NECK: No JVD. LYMPHATICS: No lymphadenopathy CARDIAC: 2/6 right upper sternal systolic murmur. RRR no gallop, or edema. VASCULAR:  Normal Pulses. No bruits. RESPIRATORY:  Clear to auscultation without rales, wheezing or rhonchi  ABDOMEN: Soft, non-tender, non-distended, No pulsatile mass, MUSCULOSKELETAL: No deformity  SKIN: Warm and dry NEUROLOGIC:  Alert and oriented x 3 PSYCHIATRIC:  Normal affect   ASSESSMENT:    1. Hyperlipidemia, unspecified hyperlipidemia type   2. Essential hypertension   3. Systolic murmur   4. Morbid obesity ([place])   5. Educated about COVID-19 virus infection    PLAN:    In order of problems listed above:  1. LDL remains relatively high despite Crestor maximal dose and Zetia 10 mg/day.  We will do coronary calcium score to determine if we need to be even more aggressive.  If calcium score is 0, we will not make changes. 2. Initial blood pressure today was 140/84 mmHg.  After rechecking it was 128/82 mmHg.  Discussed weight loss, 150 minutes or more of moderate activity per week, and less than 3 g sodium diet. 3. Probably has mild aortic stenosis.  We will see whether coronary calcium score identifies any aortic valve calcification. 4. Weight reduction should be a priority.  I encouraged physical activity and  decrease caloric intake.  Encouraged plant-based rather than high-protein diet. 5. Vaccinated, boosted, working in Loss adjuster, chartered, and practicing social medication.  Overall education and awareness concerning primary risk prevention was discussed in detail: LDL less than 70, hemoglobin A1c less than 7, blood pressure target less than 130/80 mmHg, >150 minutes of moderate aerobic activity per week, avoidance of smoking, weight control (via diet and exercise), and continued surveillance/management of/for obstructive sleep apnea.  Coronary calcium score will help guide management of lipids.   Medication Adjustments/Labs and Tests Ordered: Current medicines are reviewed at length with the patient today.  Concerns regarding medicines are outlined above.  Orders Placed This Encounter  Procedures  . CT CARDIAC SCORING (SELF PAY ONLY)  . HgB A1c   No orders of the defined types were placed in this encounter.   Patient Instructions  Medication Instructions:  Your physician recommends that you continue on your current medications as directed. Please refer to the Current Medication list given to you today.  *If you need a refill on your cardiac medications before your next appointment, please call your pharmacy*   Lab Work: A1C today  If you have labs (blood work) drawn today and your tests are completely normal, you will receive your results only by: Marland Kitchen MyChart Message (if you have MyChart) OR . A paper copy in the mail If you have any lab test that is abnormal or we need to change your treatment, we will call you to review the results.   Testing/Procedures: Your physician recommends that you have a Calcium Score performed.    Follow-Up: At [facility], you and your health needs are our priority.  As part of our continuing mission to provide you with exceptional heart care, we have created designated Provider Care Teams.  These Care Teams include your primary Cardiologist  (physician) and Advanced Practice Providers (APPs -  Physician Assistants and Nurse Practitioners) who all work together to provide you with the care you need, when you need it.  We recommend signing up for the patient portal called "MyChart".  Sign up information is provided on this After Visit Summary.  MyChart is used to connect with patients for Virtual Visits (Telemedicine).  Patients are able to view lab/test results, encounter notes, upcoming appointments, etc.  Non-urgent messages can be sent to your provider as well.   To learn more about what you can do with MyChart, go to [email or web address].    Your next appointment:   1 year(s)  The format for your next appointment:   In Person  Provider:   You may see Sinclair Grooms, MD or one of the following Advanced Practice Providers on your designated Care Team:    Kathyrn Drown, NP    Other Instructions      Signed, Sinclair Grooms, MD  09/29/2020 1:23 PM    [place]

## 2020-09-29 ENCOUNTER — Other Ambulatory Visit: Payer: Self-pay

## 2020-09-29 ENCOUNTER — Ambulatory Visit (INDEPENDENT_AMBULATORY_CARE_PROVIDER_SITE_OTHER): Payer: PRIVATE HEALTH INSURANCE | Admitting: Interventional Cardiology

## 2020-09-29 ENCOUNTER — Encounter: Payer: Self-pay | Admitting: Interventional Cardiology

## 2020-09-29 VITALS — BP 140/84 | HR 80 | Ht 64.25 in | Wt 204.0 lb

## 2020-09-29 DIAGNOSIS — E785 Hyperlipidemia, unspecified: Secondary | ICD-10-CM | POA: Diagnosis not present

## 2020-09-29 DIAGNOSIS — R011 Cardiac murmur, unspecified: Secondary | ICD-10-CM

## 2020-09-29 DIAGNOSIS — I10 Essential (primary) hypertension: Secondary | ICD-10-CM | POA: Diagnosis not present

## 2020-09-29 DIAGNOSIS — Z7189 Other specified counseling: Secondary | ICD-10-CM

## 2020-09-29 NOTE — Patient Instructions (Signed)
Medication Instructions:  Your physician recommends that you continue on your current medications as directed. Please refer to the Current Medication list given to you today.  *If you need a refill on your cardiac medications before your next appointment, please call your pharmacy*   Lab Work: A1C today  If you have labs (blood work) drawn today and your tests are completely normal, you will receive your results only by: Marland Kitchen MyChart Message (if you have MyChart) OR . A paper copy in the mail If you have any lab test that is abnormal or we need to change your treatment, we will call you to review the results.   Testing/Procedures: Your physician recommends that you have a Calcium Score performed.    Follow-Up: At [facility], you and your health needs are our priority.  As part of our continuing mission to provide you with exceptional heart care, we have created designated Provider Care Teams.  These Care Teams include your primary Cardiologist (physician) and Advanced Practice Providers (APPs -  Physician Assistants and Nurse Practitioners) who all work together to provide you with the care you need, when you need it.  We recommend signing up for the patient portal called "MyChart".  Sign up information is provided on this After Visit Summary.  MyChart is used to connect with patients for Virtual Visits (Telemedicine).  Patients are able to view lab/test results, encounter notes, upcoming appointments, etc.  Non-urgent messages can be sent to your provider as well.   To learn more about what you can do with MyChart, go to [email or web address].    Your next appointment:   1 year(s)  The format for your next appointment:   In Person  Provider:   You may see Sinclair Grooms, MD or one of the following Advanced Practice Providers on your designated Care Team:    Kathyrn Drown, NP    Other Instructions

## 2020-09-30 LAB — HEMOGLOBIN A1C
Est. average glucose Bld gHb Est-mCnc: 97 mg/dL
Hgb A1c MFr Bld: 5 % (ref 4.8–5.6)

## 2020-10-18 ENCOUNTER — Ambulatory Visit (INDEPENDENT_AMBULATORY_CARE_PROVIDER_SITE_OTHER): Payer: PRIVATE HEALTH INSURANCE | Admitting: Obstetrics and Gynecology

## 2020-10-18 ENCOUNTER — Encounter: Payer: Self-pay | Admitting: Obstetrics and Gynecology

## 2020-10-18 ENCOUNTER — Inpatient Hospital Stay: Admission: RE | Admit: 2020-10-18 | Payer: PRIVATE HEALTH INSURANCE | Source: Ambulatory Visit

## 2020-10-18 ENCOUNTER — Other Ambulatory Visit: Payer: Self-pay

## 2020-10-18 VITALS — BP 142/80 | HR 76 | Ht 64.25 in | Wt 201.0 lb

## 2020-10-18 DIAGNOSIS — Z01419 Encounter for gynecological examination (general) (routine) without abnormal findings: Secondary | ICD-10-CM | POA: Diagnosis not present

## 2020-10-18 NOTE — Patient Instructions (Signed)

## 2020-10-18 NOTE — Progress Notes (Addendum)
60 y.o. G81P1001 Married [place] American female here for annual exam.    No changes in her bladder or bowel function and control. Has some urgency, which is not common.  Drinks a lot of water while at work.  Andrew with not doing a pap today.   Received her Covid vaccine.  No booster to date.  Received her flu vaccine.   Lab with PCP in December.  A1C 5.0 on 09/29/20.  PCP: Ann Held, DO    Patient's last menstrual period was 09/03/2007 (approximate).           Sexually active: Yes.    The current method of family planning is postmenopausal.    Exercising: No.  The patient does not participate in regular exercise at present. Smoker:  no  Health Maintenance: Pap:   09/22/19 Neg:Neg HR HPV  09-09-18 Neg:Neg HR HPV  08-07-17 Neg:Neg HR HPV   07-03-16 Neg:Neg HR HPV  History of abnormal Pap:  Yes, Hx [id number], 2012, 2013. Pap ASCUS in 2014, and then normal in 2015 and 2016. MMG:  04/13/20 BIRADS 1 negative/density b Colonoscopy:  06/29/20  BMD:   n/a  Result  n/a TDaP:  09/22/14 Gardasil:   n/a HIV: Neg years ago Hep C: 10/12/15 Neg Screening Labs:  PCP   reports that she has never smoked. She has never used smokeless tobacco. She reports previous alcohol use. She reports that she does not use drugs.  Past Medical History:  Diagnosis Date  . Abnormal pap 1989   cryo  . Arthritis   . Cataract   . Chicken pox   . Endometriosis   . 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 Gallstones   . GERD (gastroesophageal reflux disease)   . Glaucoma   . Headache(784.0)   . Hyperlipidemia   . Hypertension       . Migraine   . Positive TB test   . Tibia fracture    Right leg.      Past Surgical History:  Procedure Laterality Date  . APPENDECTOMY  75  . BREAST EXCISIONAL BIOPSY Right   . CHOLECYSTECTOMY  88  . DIAGNOSTIC LAPAROSCOPY  88   endometrious  . GLAUCOMA SURGERY Left 2019  . LUMBAR FUSION  08/2011   l 3-4  . LUMBAR FUSION     l 4-5  . RIGHT  OOPHORECTOMY  75    Current Outpatient Medications  Medication Sig Dispense Refill  . ascorbic acid (VITAMIN C) 500 MG tablet Take 500 mg by mouth daily.    Marland Kitchen aspirin EC 81 MG tablet Take 1 tablet (81 mg total) by mouth daily. 90 tablet 3  . aspirin-acetaminophen-caffeine (EXCEDRIN MIGRAINE) 250-250-65 MG tablet Take 1 tablet by mouth every 6 (six) hours as needed for headache or migraine.    . Biotin 5000 MCG CAPS Take 5,000 mcg by mouth daily.    . Brinzolamide-Brimonidine 1-0.2 % SUSP Place 1 drop into both eyes 3 (three) times daily.     . Calcium Carbonate Antacid (TUMS PO) Take 1 tablet by mouth daily as needed (heartburn).     . carisoprodol (SOMA) 350 MG tablet TAKE 1 TABLET(350 MG) BY MOUTH FOUR TIMES DAILY AS NEEDED FOR MUSCLE SPASMS 60 tablet 0  . Cholecalciferol (VITAMIN D3) 2000 units TABS Take 1 tablet by mouth daily.    . cycloSPORINE (RESTASIS) 0.05 % ophthalmic emulsion Place 1 drop into both eyes 2 (two) times daily.    . diclofenac Sodium (  VOLTAREN) 1 % GEL Apply 4 g topically 4 (four) times daily. 350 g 2  . esomeprazole (NEXIUM) 40 MG capsule Take 1 capsule (40 mg total) by mouth 2 (two) times daily before a meal for 70 days, THEN 1 capsule (40 mg total) daily at 12 noon for 7 days. 147 capsule 0  . esomeprazole (NEXIUM) 40 MG capsule Take 1 capsule (40 mg total) by mouth daily. 90 capsule 3  . ezetimibe (ZETIA) 10 MG tablet Take 1 tablet (10 mg total) by mouth daily. TAKE 1 TABLET(10 MG) BY MOUTH DAILY 90 tablet 1  . folic acid (FOLVITE) 767 MCG tablet Take 400 mcg by mouth daily.    Marland Kitchen HYDROcodone-acetaminophen (NORCO/VICODIN) 5-325 MG tablet Take 1 tablet by mouth every 6 (six) hours as needed for moderate pain. 30 tablet 0  . ibuprofen (ADVIL) 200 MG tablet Take 600 mg by mouth as needed.    . Magnesium 100 MG CAPS Take 1 capsule by mouth daily.    . metoprolol succinate (TOPROL-XL) 50 MG 24 hr tablet TAKE 1 TABLET BY MOUTH EVERY DAY WITH OR IMMEDIATELY FOLLOWING A MEAL  90 tablet 2  . Multiple Vitamins-Minerals (MULTIVITAMINS THER. W/MINERALS) TABS Take 1 tablet by mouth daily.    . Netarsudil-Latanoprost 0.02-0.005 % SOLN Apply to eye.    . rosuvastatin (CRESTOR) 40 MG tablet 1 po qd 90 tablet 1  . zinc gluconate 50 MG tablet Take 50 mg by mouth daily.     No current facility-administered medications for this visit.    Family History  Problem Relation Age of Onset  . Hypertension Father   . Glaucoma Father   . Prostate cancer Father   . Renal Disease Father   . Macular degeneration Father   . Stroke Father   . Kidney disease Father   . Heart disease Father   . Hyperlipidemia Mother   . Hypertension Mother   . Breast cancer Maternal Grandmother   . Breast cancer Maternal Aunt   . Hypertension Sister   . Hyperlipidemia Sister   . Hypertension Brother   . Heart disease Brother   . Hypertension Sister   . Hypertension Brother   . Diabetes Paternal Aunt   . Diabetes Maternal Aunt   . Breast cancer Cousin   . Colon cancer Neg Hx   . Colon polyps Neg Hx   . Stomach cancer Neg Hx   . Esophageal cancer Neg Hx     Review of Systems  Constitutional: Negative.   HENT: Negative.   Eyes: Negative.   Respiratory: Negative.   Cardiovascular: Negative.   Gastrointestinal: Negative.   Endocrine: Negative.   Genitourinary: Negative.   Musculoskeletal: Negative.   Skin: Negative.   Allergic/Immunologic: Negative.   Neurological: Negative.   Hematological: Negative.   Psychiatric/Behavioral: Negative.     Exam:   BP (!) 142/80 (BP Location: Left Arm, Patient Position: Sitting, Cuff Size: Normal)   Pulse 76   Ht 5' 4.25" (1.632 m)   Wt 201 lb (91.2 kg)   LMP 09/03/2007 (Approximate)   BMI 34.23 kg/m     General appearance: alert, cooperative and appears stated age Head: normocephalic, without obvious abnormality, atraumatic Neck: no adenopathy, supple, symmetrical, trachea midline and thyroid normal to inspection and palpation Lungs:  clear to auscultation bilaterally Breasts: normal appearance, no masses or tenderness, No nipple retraction or dimpling, No nipple discharge or bleeding, No axillary adenopathy Heart: regular rate and rhythm.  Systolic murmur noted. Abdomen: soft, non-tender; no masses,  no organomegaly Extremities: extremities normal, atraumatic, no cyanosis or edema Skin: skin color, texture, turgor normal. No rashes or lesions Lymph nodes: cervical, supraclavicular, and axillary nodes normal. Neurologic: grossly normal  Pelvic: External genitalia:  no lesions              No abnormal inguinal nodes palpated.              Urethra:  normal appearing urethra with no masses, tenderness or lesions              Bartholins and Skenes: normal                 Vagina: normal appearing vagina with normal color and discharge, no lesions.  Second degree cystocele, first degree uterine prolapse, first degree rectocele.              Cervix: no lesions              Pap taken: No. Bimanual Exam:  Uterus:  normal size, contour, position, consistency, mobility, non-tender              Adnexa: no mass, fullness, tenderness              Rectal exam: Yes.  .  Confirms.              Anus:  normal sphincter tone, no lesions  Chaperone was present for exam.  Assessment:   Well woman visit with normal exam. Hx LGSIL.  Hx cryotherapy.  ?Hx LEEP.  Incomplete uterovaginal prolapse. Glaucoma. Status post right oophorectomy.  Hx endometriosis. Systolic murmur.  Followed by cardiology.   Plan: Mammogram screening discussed. Self breast awareness reviewed. Pap and HR HPV as above. Guidelines for Calcium, Vitamin D, regular exercise program including cardiovascular and weight bearing exercise. We discussed her pelvic organ prolapse and options for care:  Observation, pessary, pelvic floor PT, and surgical correction - LAVH, salpingectomy, possible oophorectomy, anterior and posterior colporrhaphy, possible TVT midurethral  sling based on urodynamic testing done with reduction of the prolapse using a pessary Follow up annually and prn.

## 2020-10-19 ENCOUNTER — Ambulatory Visit (INDEPENDENT_AMBULATORY_CARE_PROVIDER_SITE_OTHER)
Admission: RE | Admit: 2020-10-19 | Discharge: 2020-10-19 | Disposition: A | Discharge status: Home / Self Care | Payer: Self-pay | Source: Ambulatory Visit | Attending: Interventional Cardiology | Admitting: Interventional Cardiology

## 2020-10-19 DIAGNOSIS — I10 Essential (primary) hypertension: Secondary | ICD-10-CM

## 2020-10-19 DIAGNOSIS — E785 Hyperlipidemia, unspecified: Secondary | ICD-10-CM

## 2020-10-20 ENCOUNTER — Other Ambulatory Visit: Payer: Self-pay | Admitting: Family Medicine

## 2020-10-20 DIAGNOSIS — E785 Hyperlipidemia, unspecified: Secondary | ICD-10-CM

## 2020-10-24 ENCOUNTER — Telehealth: Payer: Self-pay | Admitting: Interventional Cardiology

## 2020-10-24 DIAGNOSIS — E7849 Other hyperlipidemia: Secondary | ICD-10-CM

## 2020-10-24 DIAGNOSIS — I359 Nonrheumatic aortic valve disorder, unspecified: Secondary | ICD-10-CM

## 2020-10-24 NOTE — Telephone Encounter (Signed)
Patient returning call for CT results. 

## 2020-10-24 NOTE — Telephone Encounter (Signed)
Spoke with pt and reviewed results and recommendations per Dr. Tamala Julian. Pt agreeable to plan.  Pt aware that someone will call to get appts for Lipid Clinic and echo.

## 2020-11-03 ENCOUNTER — Other Ambulatory Visit: Payer: Self-pay

## 2020-11-03 DIAGNOSIS — I10 Essential (primary) hypertension: Secondary | ICD-10-CM

## 2020-11-03 MED ORDER — METOPROLOL SUCCINATE ER 50 MG PO TB24: ORAL_TABLET | ORAL | 3 refills | Status: DC

## 2020-11-07 ENCOUNTER — Encounter: Payer: Self-pay | Admitting: Pharmacist

## 2020-11-07 ENCOUNTER — Other Ambulatory Visit: Payer: Self-pay

## 2020-11-07 ENCOUNTER — Ambulatory Visit (INDEPENDENT_AMBULATORY_CARE_PROVIDER_SITE_OTHER): Payer: PRIVATE HEALTH INSURANCE | Admitting: Pharmacist

## 2020-11-07 DIAGNOSIS — E7849 Other hyperlipidemia: Secondary | ICD-10-CM | POA: Diagnosis not present

## 2020-11-07 MED ORDER — REPATHA SURECLICK 140 MG/ML ~~LOC~~ SOAJ: 1.0000 mL | SUBCUTANEOUS | 3 refills | Status: DC

## 2020-11-07 NOTE — Progress Notes (Signed)
Patient ID: Yvonne Sanders                 DOB: March 08, 1961                    MRN: [id number]     HPI: Yvonne Sanders is a 60 y.o. female patient referred to lipid clinic by Dr Tamala Julian. PMH is significant for LVH, migraine, HTN, and glaucoma.  Patient was seen by Dr. Tamala Julian on 09/29/20.  Has been managed on Zetia 10mg  and Crestor 40mg  with no adverse effects.  However, LDL has remained above goal.  A coronary CT was ordered which showed severe calcifications of the aoritc valve and a coronary calcium score of 597 (99th percentile for age and sex).  Patient was referred to ;lipid clinic for consideration of PCSK9i.  Patient presents today in good spirits.  Reports she was diagnosed with high cholesterol when she was 59 years old.  Has been taking rosuvastatin 40mg  since 2016 with no adverse effects.  Occasionally has muscle pain but does not know if that is related to her multiple surgeries and orthopedic procedures.    Diet is fairly well controlled.  Denies alcohol or tobacco use.  Typically eats chicken, vegetables, and does not eat out.  Cooks most food in her crock pot.  She does fry fish.  Has been working on cutting back on sweet tea by switching to zero sugar sodas.  Drinks black coffee in the morning.  Has not been physically active this winter due to it being cold but she says when it is warmer her and her husband are very active.  Walks 4 miles a day and is active in her garden.  Is familiar with Repatha.  Brother has been taking it.  Current Medications: Zetia 10mg , Crestor 40mg  Intolerances: n/a Risk Factors: Coronary calcium score, HTN, CAD LDL goal: <55  Labs: 1. Coronary calcium score of 597. This was 65 percentile for age and sex matched control.  2. Severe calcifications of the aortic valve (calcium score 3654) and of the aortic root. Consider an echocardiogram for evaluation of severity of aortic stenosis.  LDL 101, HDL 70, Trigs 66, TC 184 (08-21-20 on Crestor 40 and  Zetia 10)  Past Medical History:  Diagnosis Date  . Abnormal pap 1989   cryo  . Arthritis   . Cataract   . Chicken pox   . Endometriosis   . 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 Gallstones   . GERD (gastroesophageal reflux disease)   . Glaucoma   . Headache(784.0)   . Hyperlipidemia   . Hypertension       . Migraine   . Positive TB test   . Tibia fracture    Right leg.      Current Outpatient Medications on File Prior to Visit  Medication Sig Dispense Refill  . ascorbic acid (VITAMIN C) 500 MG tablet Take 500 mg by mouth daily.    Marland Kitchen aspirin EC 81 MG tablet Take 1 tablet (81 mg total) by mouth daily. 90 tablet 3  . aspirin-acetaminophen-caffeine (EXCEDRIN MIGRAINE) 250-250-65 MG tablet Take 1 tablet by mouth every 6 (six) hours as needed for headache or migraine.    . Biotin 5000 MCG CAPS Take 5,000 mcg by mouth daily.    . Brinzolamide-Brimonidine 1-0.2 % SUSP Place 1 drop into both eyes 3 (three) times daily.     . Calcium Carbonate Antacid (TUMS PO) Take 1 tablet by  mouth daily as needed (heartburn).     . carisoprodol (SOMA) 350 MG tablet TAKE 1 TABLET(350 MG) BY MOUTH FOUR TIMES DAILY AS NEEDED FOR MUSCLE SPASMS 60 tablet 0  . Cholecalciferol (VITAMIN D3) 2000 units TABS Take 1 tablet by mouth daily.    . cycloSPORINE (RESTASIS) 0.05 % ophthalmic emulsion Place 1 drop into both eyes 2 (two) times daily.    . diclofenac Sodium (VOLTAREN) 1 % GEL Apply 4 g topically 4 (four) times daily. 350 g 2  . esomeprazole (NEXIUM) 40 MG capsule Take 1 capsule (40 mg total) by mouth 2 (two) times daily before a meal for 70 days, THEN 1 capsule (40 mg total) daily at 12 noon for 7 days. 147 capsule 0  . esomeprazole (NEXIUM) 40 MG capsule Take 1 capsule (40 mg total) by mouth daily. 90 capsule 3  . ezetimibe (ZETIA) 10 MG tablet Take 1 tablet (10 mg total) by mouth daily. TAKE 1 TABLET(10 MG) BY MOUTH DAILY 90 tablet 1  . folic acid (FOLVITE) 784 MCG tablet Take 400 mcg by mouth  daily.    Marland Kitchen HYDROcodone-acetaminophen (NORCO/VICODIN) 5-325 MG tablet Take 1 tablet by mouth every 6 (six) hours as needed for moderate pain. 30 tablet 0  . ibuprofen (ADVIL) 200 MG tablet Take 600 mg by mouth as needed.    . Magnesium 100 MG CAPS Take 1 capsule by mouth daily.    . metoprolol succinate (TOPROL-XL) 50 MG 24 hr tablet TAKE 1 TABLET BY MOUTH EVERY DAY WITH OR IMMEDIATELY FOLLOWING A MEAL 90 tablet 3  . Multiple Vitamins-Minerals (MULTIVITAMINS THER. W/MINERALS) TABS Take 1 tablet by mouth daily.    . Netarsudil-Latanoprost 0.02-0.005 % SOLN Apply to eye.    . rosuvastatin (CRESTOR) 40 MG tablet 1 po qd 90 tablet 1  . zinc gluconate 50 MG tablet Take 50 mg by mouth daily.     No current facility-administered medications on file prior to visit.    Allergies  Allergen Reactions  . Nubain [Nalbuphine Hcl] Anaphylaxis  . Corticosteroids Other (See Comments)    Due to glaucoma can not have steroids  . Dilaudid [Hydromorphone Hcl] Nausea And Vomiting    Severe itching  . Nubain [Nalbuphine Hcl] Nausea And Vomiting  . Oxycodone-Acetaminophen Itching  . Percodan [Oxycodone-Aspirin] Hives and Itching  . Tomato Rash    Rash, spots on tongue    Assessment/Plan:  1. Hyperlipidemia - Patient most recent LDL 101 which is above goal of <55. Aggressive goal selected due to elevated calcium score.  Patient needs aggressive lipid control.    Using demo pen, educated patient on Repatha storage, site selection, and administration.  Patient was able to demonstrate use in room.  Will complete PA. Attempted to download copay card or patient but website reported she would need to call and provide more information.    Recommended patient adopt a diet high in vegetables, lean proteins, healthy fats, and whole grains.  Printed out information regarding the [place].  Recommended she increase her physical activity as tolerated up to 150 min a week.  Patient voiced understanding.     Will contact patient when PA is approved.  Karren Cobble, PharmD, BCACP, [place], [place] [address] [place], [place] [address] Phone: [phone]; Fax: [phone] 11/07/2020 5:38 PM

## 2020-11-07 NOTE — Patient Instructions (Addendum)
It was nice meeting you today!  We would like your LDL (bad cholesterol) to be less than 70  Continue your rosuvastatin 40mg  once daily and your Zetia 10mg  once daily  Try to focus your diet on vegetables, lean meat, whole grains, and healthy fats  Work your way up to 150 minutes a week of exercise (about 30 minutes a day at least 5 days a week)  We will start a new medication called Repatha 140mg  which you will inject once every 2 weeks  I will call you when the prior authorization is approved  Please call with any questions!  Karren Cobble, PharmD, BCACP, [place], [place] [address] [place], [place] [address] Phone: [phone]; Fax: [phone] 11/07/2020 4:39 PM

## 2020-11-08 ENCOUNTER — Telehealth: Payer: Self-pay | Admitting: Pharmacist

## 2020-11-08 NOTE — Telephone Encounter (Signed)
Attempted PA for patient's Repatha.  Received message that no matching patient was found.  Called and left message and asked for call back

## 2020-11-09 ENCOUNTER — Telehealth: Payer: Self-pay | Admitting: Pharmacist

## 2020-11-09 DIAGNOSIS — E7849 Other hyperlipidemia: Secondary | ICD-10-CM

## 2020-11-09 MED ORDER — REPATHA SURECLICK 140 MG/ML ~~LOC~~ SOAJ: 1.0000 mL | SUBCUTANEOUS | 3 refills | Status: DC

## 2020-11-09 NOTE — Telephone Encounter (Signed)
PA SENT BY PHONE TO [facility] FOR [place]

## 2020-11-09 NOTE — Telephone Encounter (Signed)
Spoke with patient regarding Cover my meds not being able to find her name and DOB.  Her information was the same as we had in our system and she reports her insurance plan is active so unsure why I am receiving the error message.  Will route Repatha Rx to [facility] instead or [facility] because that is where patient has typically been receiving her prescriptions and she knows they have her current information.  Will double check with [facility] about Repatha denial

## 2020-11-09 NOTE — Telephone Encounter (Signed)
Called and spoke w/pt regarding the pa having been submitted by phone and they voiced gratitude and understanding.

## 2020-11-10 ENCOUNTER — Telehealth: Payer: Self-pay | Admitting: Interventional Cardiology

## 2020-11-10 NOTE — Telephone Encounter (Signed)
New message:   Lylina from [place] calling to get a prior authorization for Repatha

## 2020-11-10 NOTE — Telephone Encounter (Signed)
Prior authorization is in progress. Submitted on 3/10. Called [facility] and confirmed it is in progress- needs pharmacist review.

## 2020-11-13 NOTE — Telephone Encounter (Signed)
Prior authorization for Repatha denied. Appeals letter faxed over.

## 2020-11-14 NOTE — Telephone Encounter (Signed)
Appeals overturned and Repatha approved through 05/16/2021.

## 2020-11-16 ENCOUNTER — Other Ambulatory Visit (HOSPITAL_COMMUNITY): Payer: PRIVATE HEALTH INSURANCE

## 2020-11-17 NOTE — Telephone Encounter (Signed)
Patient called to follow up on the status of her PA. Advised that it was approved. I will call [facility] to let them know it was approved. Patient still needs to call Repatha to get her copay card figured out. She will do that this afternoon. She has physical with PCP on 02/26/21. Will get repeat lipid panel done at that time.

## 2020-12-08 ENCOUNTER — Other Ambulatory Visit: Payer: Self-pay

## 2020-12-08 ENCOUNTER — Ambulatory Visit (HOSPITAL_COMMUNITY): Payer: PRIVATE HEALTH INSURANCE | Attending: Cardiology

## 2020-12-08 DIAGNOSIS — I359 Nonrheumatic aortic valve disorder, unspecified: Secondary | ICD-10-CM | POA: Insufficient documentation

## 2020-12-08 LAB — ECHOCARDIOGRAM COMPLETE
AR max vel: 0.89 cm2
AV Area VTI: 0.86 cm2
AV Area mean vel: 0.87 cm2
AV Mean grad: 19 mmHg
AV Peak grad: 37.7 mmHg
Ao pk vel: 3.07 m/s
Area-P 1/2: 3.96 cm2
S' Lateral: 2.3 cm

## 2020-12-26 ENCOUNTER — Ambulatory Visit: Payer: PRIVATE HEALTH INSURANCE | Admitting: Medical

## 2021-01-02 ENCOUNTER — Ambulatory Visit (INDEPENDENT_AMBULATORY_CARE_PROVIDER_SITE_OTHER): Payer: PRIVATE HEALTH INSURANCE | Admitting: Family Medicine

## 2021-01-02 ENCOUNTER — Encounter: Payer: Self-pay | Admitting: Family Medicine

## 2021-01-02 ENCOUNTER — Ambulatory Visit (HOSPITAL_BASED_OUTPATIENT_CLINIC_OR_DEPARTMENT_OTHER)
Admission: RE | Admit: 2021-01-02 | Discharge: 2021-01-02 | Disposition: A | Discharge status: Home / Self Care | Payer: PRIVATE HEALTH INSURANCE | Source: Ambulatory Visit | Attending: Family Medicine | Admitting: Family Medicine

## 2021-01-02 ENCOUNTER — Other Ambulatory Visit: Payer: Self-pay

## 2021-01-02 VITALS — BP 132/82 | HR 59 | Temp 98.7°F | Resp 18 | Ht 64.25 in | Wt 198.6 lb

## 2021-01-02 DIAGNOSIS — M48061 Spinal stenosis, lumbar region without neurogenic claudication: Secondary | ICD-10-CM | POA: Diagnosis not present

## 2021-01-02 DIAGNOSIS — M25571 Pain in right ankle and joints of right foot: Secondary | ICD-10-CM | POA: Insufficient documentation

## 2021-01-02 DIAGNOSIS — R200 Anesthesia of skin: Secondary | ICD-10-CM | POA: Diagnosis present

## 2021-01-02 DIAGNOSIS — M549 Dorsalgia, unspecified: Secondary | ICD-10-CM

## 2021-01-02 DIAGNOSIS — R202 Paresthesia of skin: Secondary | ICD-10-CM

## 2021-01-02 MED ORDER — CARISOPRODOL 350 MG PO TABS: ORAL_TABLET | ORAL | 0 refills | Status: DC

## 2021-01-02 NOTE — Progress Notes (Signed)
Subjective:   By signing my name below, I, Shehryar Baig, attest that this documentation has been prepared under the direction and in the presence of Dr. Roma Schanz, DO. 01/02/2021      Patient ID: Yvonne Sanders, female    DOB: 19-Sep-1960, 60 y.o.   MRN: [id number]  Chief Complaint  Patient presents with  . Ankle Pain    Right ankle pain, x2 months, pt states stepping on uneven concrete and states having swelling and pain.     HPI Patient is in today for a office visit. She is complaining of aching pain and swelling in her ankle for the past 6 weeks. She stepped on uneven pavement and inverted her right ankle while exiting her car. She uses ice to manage the swelling and finds mild relief. She reports having slight struggles falling asleep at night due to the pain. She is also complaining of pain in her upper back. She injured her upper back last year in January while using an inversion table. She also has occasional tingling in her right hand. She denies having any fever, chills, ear pain, congestion, sinus pain, sore throat, eye pain, chest pain, palpations, cough, shortness of breath, wheezing, nausea, vomiting, diarrhea, constipation, blood in stool, dysuria, frequency, hematuria, dizziness, headaches at this time.   Past Medical History:  Diagnosis Date  . Abnormal pap 1989   cryo  . Arthritis   . Cataract   . Chicken pox   . Endometriosis   . 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 Gallstones   . GERD (gastroesophageal reflux disease)   . Glaucoma   . Headache(784.0)   . Hyperlipidemia   . Hypertension       . Migraine   . Positive TB test   . Tibia fracture    Right leg.      Past Surgical History:  Procedure Laterality Date  . APPENDECTOMY  75  . BREAST EXCISIONAL BIOPSY Right   . CHOLECYSTECTOMY  88  . DIAGNOSTIC LAPAROSCOPY  88   endometrious  . GLAUCOMA SURGERY Left 2019  . LUMBAR FUSION  08/2011   l 3-4  . LUMBAR FUSION     l 4-5  .  RIGHT OOPHORECTOMY  49    Family History  Problem Relation Age of Onset  . Hypertension Father   . Glaucoma Father   . Prostate cancer Father   . Renal Disease Father   . Macular degeneration Father   . Stroke Father   . Kidney disease Father   . Heart disease Father   . Hyperlipidemia Mother   . Hypertension Mother   . Breast cancer Maternal Grandmother   . Breast cancer Maternal Aunt   . Hypertension Sister   . Hyperlipidemia Sister   . Hypertension Brother   . Heart disease Brother   . Hypertension Sister   . Hypertension Brother   . Diabetes Paternal Aunt   . Diabetes Maternal Aunt   . Breast cancer Cousin   . Colon cancer Neg Hx   . Colon polyps Neg Hx   . Stomach cancer Neg Hx   . Esophageal cancer Neg Hx     Social History   Socioeconomic History  . Marital status: Married    Spouse name: Not on file  . Number of children: 1  . Years of education: Not on file  . Highest education level: Not on file  Occupational History  . Occupation: Surgical coodinator  Tobacco Use  .  Smoking status: Never Smoker  . Smokeless tobacco: Never Used  Vaping Use  . Vaping Use: Never used  Substance and Sexual Activity  . Alcohol use: Not Currently  . Drug use: No  . Sexual activity: Yes    Partners: Male    Birth control/protection: Post-menopausal  Other Topics Concern  . Not on file  Social History Narrative  . Not on file   Social Determinants of Health   Financial Resource Strain: Not on file  Food Insecurity: Not on file  Transportation Needs: Not on file  Physical Activity: Not on file  Stress: Not on file  Social Connections: Not on file  Intimate Partner Violence: Not on file    Outpatient Medications Prior to Visit  Medication Sig Dispense Refill  . ascorbic acid (VITAMIN C) 500 MG tablet Take 500 mg by mouth daily.    Marland Kitchen aspirin EC 81 MG tablet Take 1 tablet (81 mg total) by mouth daily. 90 tablet 3  . aspirin-acetaminophen-caffeine (EXCEDRIN  MIGRAINE) 250-250-65 MG tablet Take 1 tablet by mouth every 6 (six) hours as needed for headache or migraine.    . Biotin 5000 MCG CAPS Take 5,000 mcg by mouth daily.    . Brinzolamide-Brimonidine 1-0.2 % SUSP Place 1 drop into both eyes 3 (three) times daily.     . Calcium Carbonate Antacid (TUMS PO) Take 1 tablet by mouth daily as needed (heartburn).     . Cholecalciferol (VITAMIN D3) 2000 units TABS Take 1 tablet by mouth daily.    . cycloSPORINE (RESTASIS) 0.05 % ophthalmic emulsion Place 1 drop into both eyes 2 (two) times daily.    . diclofenac Sodium (VOLTAREN) 1 % GEL Apply 4 g topically 4 (four) times daily. 350 g 2  . esomeprazole (NEXIUM) 40 MG capsule Take 1 capsule (40 mg total) by mouth daily. 90 capsule 3  . Evolocumab (REPATHA SURECLICK) 956 MG/ML SOAJ Inject 1 mL into the skin every 14 (fourteen) days. 6 mL 3  . ezetimibe (ZETIA) 10 MG tablet Take 1 tablet (10 mg total) by mouth daily. TAKE 1 TABLET(10 MG) BY MOUTH DAILY 90 tablet 1  . folic acid (FOLVITE) 213 MCG tablet Take 400 mcg by mouth daily.    Marland Kitchen HYDROcodone-acetaminophen (NORCO/VICODIN) 5-325 MG tablet Take 1 tablet by mouth every 6 (six) hours as needed for moderate pain. 30 tablet 0  . ibuprofen (ADVIL) 200 MG tablet Take 600 mg by mouth as needed.    . Magnesium 100 MG CAPS Take 1 capsule by mouth daily.    . metoprolol succinate (TOPROL-XL) 50 MG 24 hr tablet TAKE 1 TABLET BY MOUTH EVERY DAY WITH OR IMMEDIATELY FOLLOWING A MEAL 90 tablet 3  . Multiple Vitamins-Minerals (MULTIVITAMINS THER. W/MINERALS) TABS Take 1 tablet by mouth daily.    . Netarsudil-Latanoprost 0.02-0.005 % SOLN Apply 1 drop to eye at bedtime.    . rosuvastatin (CRESTOR) 40 MG tablet 1 po qd 90 tablet 1  . vitamin B-12 (CYANOCOBALAMIN) 100 MCG tablet Take 100 mcg by mouth daily. Unsure of strength    . zinc gluconate 50 MG tablet Take 50 mg by mouth daily.    . carisoprodol (SOMA) 350 MG tablet TAKE 1 TABLET(350 MG) BY MOUTH FOUR TIMES DAILY AS  NEEDED FOR MUSCLE SPASMS 60 tablet 0  . esomeprazole (NEXIUM) 40 MG capsule Take 1 capsule (40 mg total) by mouth 2 (two) times daily before a meal for 70 days, THEN 1 capsule (40 mg total) daily at 12  noon for 7 days. 147 capsule 0   No facility-administered medications prior to visit.    Allergies  Allergen Reactions  . Nubain [Nalbuphine Hcl] Anaphylaxis  . Corticosteroids Other (See Comments)    Due to glaucoma can not have steroids  . Dilaudid [Hydromorphone Hcl] Nausea And Vomiting    Severe itching  . Nubain [Nalbuphine Hcl] Nausea And Vomiting  . Oxycodone-Acetaminophen Itching  . Percodan [Oxycodone-Aspirin] Hives and Itching  . Tomato Rash    Rash, spots on tongue    Review of Systems  Constitutional: Negative for chills and fever.  HENT: Negative for congestion, ear pain, sinus pain and sore throat.   Eyes: Negative for pain.  Respiratory: Negative for cough, shortness of breath and wheezing.   Cardiovascular: Negative for chest pain and palpitations.  Gastrointestinal: Negative for blood in stool, constipation, diarrhea, nausea and vomiting.  Genitourinary: Negative for dysuria, frequency and hematuria.  Musculoskeletal: Positive for back pain (Upper back) and joint pain (Right ankle).  Neurological: Negative for dizziness and headaches.       Objective:    Physical Exam Constitutional:      Appearance: Normal appearance.  HENT:     Head: Normocephalic and atraumatic.     Right Ear: External ear normal.     Left Ear: External ear normal.  Eyes:     Extraocular Movements: Extraocular movements intact.     Pupils: Pupils are equal, round, and reactive to light.  Cardiovascular:     Rate and Rhythm: Normal rate and regular rhythm.     Pulses: Normal pulses.     Heart sounds: Normal heart sounds.  Pulmonary:     Effort: Pulmonary effort is normal.     Breath sounds: Normal breath sounds.  Abdominal:     General: Bowel sounds are normal.  Skin:     General: Skin is warm and dry.  Neurological:     Mental Status: She is alert and oriented to person, place, and time.  Psychiatric:        Behavior: Behavior normal.     BP 132/82 (BP Location: Right Arm, Patient Position: Sitting, Cuff Size: Large)   Pulse (!) 59   Temp 98.7 F (37.1 C) (Oral)   Resp 18   Ht 5' 4.25" (1.632 m)   Wt 198 lb 9.6 oz (90.1 kg)   LMP 09/03/2007 (Approximate)   SpO2 99%   BMI 33.82 kg/m  Wt Readings from Last 3 Encounters:  01/02/21 198 lb 9.6 oz (90.1 kg)  10/18/20 201 lb (91.2 kg)  09/29/20 204 lb (92.5 kg)    Diabetic Foot Exam - Simple   No data filed    Lab Results  Component Value Date   WBC 5.1 08/21/2020   HGB 11.5 (L) 08/21/2020   HCT 36.0 08/21/2020   PLT 187.0 08/21/2020   GLUCOSE 92 08/21/2020   CHOL 184 08/21/2020   TRIG 66.0 08/21/2020   HDL 70.10 08/21/2020   LDLCALC 101 (H) 08/21/2020   ALT 30 08/21/2020   AST 23 08/21/2020   NA 141 08/21/2020   K 4.2 08/21/2020   CL 109 08/21/2020   CREATININE 0.67 08/21/2020   BUN 12 08/21/2020   CO2 26 08/21/2020   TSH 0.84 02/28/2020   HGBA1C 5.0 09/29/2020    Lab Results  Component Value Date   TSH 0.84 02/28/2020   Lab Results  Component Value Date   WBC 5.1 08/21/2020   HGB 11.5 (L) 08/21/2020   HCT 36.0 08/21/2020  MCV 85.4 08/21/2020   PLT 187.0 08/21/2020   Lab Results  Component Value Date   NA 141 08/21/2020   K 4.2 08/21/2020   CO2 26 08/21/2020   GLUCOSE 92 08/21/2020   BUN 12 08/21/2020   CREATININE 0.67 08/21/2020   BILITOT 0.4 08/21/2020   ALKPHOS 70 08/21/2020   AST 23 08/21/2020   ALT 30 08/21/2020   PROT 6.8 08/21/2020   ALBUMIN 4.1 08/21/2020   CALCIUM 9.1 08/21/2020   GFR 95.78 08/21/2020   Lab Results  Component Value Date   CHOL 184 08/21/2020   Lab Results  Component Value Date   HDL 70.10 08/21/2020   Lab Results  Component Value Date   LDLCALC 101 (H) 08/21/2020   Lab Results  Component Value Date   TRIG 66.0  08/21/2020   Lab Results  Component Value Date   CHOLHDL 3 08/21/2020   Lab Results  Component Value Date   HGBA1C 5.0 09/29/2020       Assessment & Plan:   Problem List Items Addressed This Visit      Unprioritized   Acute right ankle pain - Primary    Ankle brace Xray today since it occurred 6 weeks ago Ice/ heat , elevated  Consider sport med/ pt --- pending xray      Relevant Orders   DG Ankle Complete Right   Lumbar foraminal stenosis   Relevant Medications   carisoprodol (SOMA) 350 MG tablet   Mid-back pain, acute    Since last winter  Check xray Pt has muscle relaxer and pain meds if needed      Relevant Medications   carisoprodol (SOMA) 350 MG tablet   Other Relevant Orders   DG Thoracic Spine 2 View   Numbness and tingling of right arm   Relevant Orders   DG Cervical Spine Complete    Other Visit Diagnoses    Mid back pain       Relevant Medications   carisoprodol (SOMA) 350 MG tablet       Meds ordered this encounter  Medications  . carisoprodol (SOMA) 350 MG tablet    Sig: TAKE 1 TABLET(350 MG) BY MOUTH FOUR TIMES DAILY AS NEEDED FOR MUSCLE SPASMS    Dispense:  60 tablet    Refill:  0    I, Ann Held, DO, personally preformed the services described in this documentation.  All medical record entries made by the scribe were at my direction and in my presence.  I have reviewed the chart and discharge instructions (if applicable) and agree that the record reflects my personal performance and is accurate and complete. 01/02/2021   I,Shehryar Baig,acting as a scribe for Ann Held, DO.,have documented all relevant documentation on the behalf of Ann Held, DO,as directed by  Ann Held, DO while in the presence of Ann Held, DO.   Ann Held, DO

## 2021-01-02 NOTE — Patient Instructions (Signed)
Ankle Sprain  An ankle sprain is a stretch or tear in a ligament in the ankle. Ligaments are tissues that connect bones to each other. The two most common types of ankle sprains are:  Inversion sprain. This happens when the foot turns inward and the ankle rolls outward. It affects the ligament on the outside of the foot (lateral ligament).  Eversion sprain. This happens when the foot turns outward and the ankle rolls inward. It affects the ligament on the inner side of the foot (medial ligament). What are the causes? This condition is often caused by accidentally rolling or twisting the ankle. What increases the risk? You are more likely to develop this condition if you play sports. What are the signs or symptoms? Symptoms of this condition include:  Pain in your ankle.  Swelling.  Bruising. This may develop right after you sprain your ankle or 1-2 days later.  Trouble standing or walking, especially when you turn or change directions.   How is this diagnosed? This condition is diagnosed with:  A physical exam. During the exam, your health care provider will press on certain parts of your foot and ankle and try to move them in certain ways.  X-ray imaging. These may be taken to see how severe the sprain is and to check for broken bones. How is this treated? This condition may be treated with:  A brace or splint. This is used to keep the ankle from moving until it heals.  An elastic bandage. This is used to support the ankle.  Crutches.  Pain medicine.  Surgery. This may be needed if the sprain is severe.  Physical therapy. This may help to improve the range of motion in the ankle. Follow these instructions at home: If you have a brace or a splint:  Wear the brace or splint as told by your health care provider. Remove it only as told by your health care provider.  Loosen the brace or splint if your toes tingle, become numb, or turn cold and blue.  Keep the brace or  splint clean.  If the brace or splint is not waterproof: ? Do not let it get wet. ? Cover it with a watertight covering when you take a bath or a shower. If you have an elastic bandage (dressing):  Remove it to shower or bathe.  Try not to move your ankle much, but wiggle your toes from time to time. This helps to prevent swelling.  Adjust the dressing to make it more comfortable if it feels too tight.  Loosen the dressing if you have numbness or tingling in your foot, or if your foot becomes cold and blue. Managing pain, stiffness, and swelling  Take over-the-counter and prescription medicines only as told by your health care provider.  For 2-3 days, keep your ankle raised (elevated) above the level of your heart as much as possible.  If directed, put ice on the injured area: ? If you have a removable brace or splint, remove it as told by your health care provider. ? Put ice in a plastic bag. ? Place a towel between your skin and the bag. ? Leave the ice on for 20 minutes, 2-3 times a day.   General instructions  Rest your ankle.  Do not use the injured limb to support your body weight until your health care provider says that you can. Use crutches as told by your health care provider.  Do not use any products that contain nicotine or  tobacco, such as cigarettes, e-cigarettes, and chewing tobacco. If you need help quitting, ask your health care provider.  Keep all follow-up visits as told by your health care provider. This is important. Contact a health care provider if:  You have rapidly increasing bruising or swelling.  Your pain is not relieved with medicine. Get help right away if:  Your foot or toes become numb or blue.  You have severe pain that gets worse. Summary  An ankle sprain is a stretch or tear in a ligament in the ankle. Ligaments are tissues that connect bones to each other.  This condition is often caused by accidentally rolling or twisting the  ankle.  Symptoms include pain, swelling, bruising, and trouble walking.  To relieve pain and swelling, put ice on the affected ankle, raise your ankle above the level of your heart, and use an elastic bandage.  Keep all follow-up visits as told by your health care provider. This is important. This information is not intended to replace advice given to you by your health care provider. Make sure you discuss any questions you have with your health care provider. Document Revised: 05/11/2018 Document Reviewed: 01/13/2018 Elsevier Patient Education  2021 [employer]  

## 2021-01-03 DIAGNOSIS — M25571 Pain in right ankle and joints of right foot: Secondary | ICD-10-CM | POA: Insufficient documentation

## 2021-01-03 DIAGNOSIS — R200 Anesthesia of skin: Secondary | ICD-10-CM | POA: Insufficient documentation

## 2021-01-03 DIAGNOSIS — M549 Dorsalgia, unspecified: Secondary | ICD-10-CM | POA: Insufficient documentation

## 2021-01-03 NOTE — Assessment & Plan Note (Signed)
Since last winter  Check xray Pt has muscle relaxer and pain meds if needed

## 2021-01-03 NOTE — Assessment & Plan Note (Signed)
Ankle brace Xray today since it occurred 6 weeks ago Ice/ heat , elevated  Consider sport med/ pt --- pending xray

## 2021-01-05 ENCOUNTER — Other Ambulatory Visit: Payer: Self-pay | Admitting: Family Medicine

## 2021-01-05 DIAGNOSIS — M5134 Other intervertebral disc degeneration, thoracic region: Secondary | ICD-10-CM

## 2021-01-05 DIAGNOSIS — M4802 Spinal stenosis, cervical region: Secondary | ICD-10-CM

## 2021-01-11 ENCOUNTER — Other Ambulatory Visit: Payer: Self-pay | Admitting: Family Medicine

## 2021-01-11 DIAGNOSIS — M4802 Spinal stenosis, cervical region: Secondary | ICD-10-CM

## 2021-02-26 ENCOUNTER — Encounter: Payer: PRIVATE HEALTH INSURANCE | Admitting: Family Medicine

## 2021-03-22 ENCOUNTER — Other Ambulatory Visit: Payer: Self-pay | Admitting: Family Medicine

## 2021-03-30 ENCOUNTER — Other Ambulatory Visit: Payer: Self-pay

## 2021-03-30 ENCOUNTER — Ambulatory Visit (INDEPENDENT_AMBULATORY_CARE_PROVIDER_SITE_OTHER): Payer: No Typology Code available for payment source | Admitting: Family Medicine

## 2021-03-30 ENCOUNTER — Encounter: Payer: Self-pay | Admitting: Family Medicine

## 2021-03-30 VITALS — BP 155/71 | HR 68 | Temp 98.8°F | Resp 16 | Ht 65.0 in | Wt 206.0 lb

## 2021-03-30 DIAGNOSIS — R200 Anesthesia of skin: Secondary | ICD-10-CM | POA: Diagnosis not present

## 2021-03-30 DIAGNOSIS — E785 Hyperlipidemia, unspecified: Secondary | ICD-10-CM | POA: Diagnosis not present

## 2021-03-30 DIAGNOSIS — Z0000 Encounter for general adult medical examination without abnormal findings: Secondary | ICD-10-CM | POA: Insufficient documentation

## 2021-03-30 DIAGNOSIS — R202 Paresthesia of skin: Secondary | ICD-10-CM

## 2021-03-30 DIAGNOSIS — I10 Essential (primary) hypertension: Secondary | ICD-10-CM | POA: Diagnosis not present

## 2021-03-30 DIAGNOSIS — E1165 Type 2 diabetes mellitus with hyperglycemia: Secondary | ICD-10-CM

## 2021-03-30 DIAGNOSIS — M48061 Spinal stenosis, lumbar region without neurogenic claudication: Secondary | ICD-10-CM | POA: Diagnosis not present

## 2021-03-30 DIAGNOSIS — G894 Chronic pain syndrome: Secondary | ICD-10-CM

## 2021-03-30 LAB — COMPREHENSIVE METABOLIC PANEL
ALT: 37 U/L — ABNORMAL HIGH (ref 0–35)
AST: 31 U/L (ref 0–37)
Albumin: 4.4 g/dL (ref 3.5–5.2)
Alkaline Phosphatase: 76 U/L (ref 39–117)
BUN: 9 mg/dL (ref 6–23)
CO2: 27 mEq/L (ref 19–32)
Calcium: 9.8 mg/dL (ref 8.4–10.5)
Chloride: 107 mEq/L (ref 96–112)
Creatinine, Ser: 0.63 mg/dL (ref 0.40–1.20)
GFR: 96.8 mL/min (ref >60.00)
Glucose, Bld: 87 mg/dL (ref 70–99)
Potassium: 4.2 mEq/L (ref 3.5–5.1)
Sodium: 142 mEq/L (ref 135–145)
Total Bilirubin: 0.6 mg/dL (ref 0.2–1.2)
Total Protein: 7.2 g/dL (ref 6.0–8.3)

## 2021-03-30 LAB — CBC WITH DIFFERENTIAL/PLATELET
Basophils Absolute: 0 10*3/uL (ref 0.0–0.1)
Basophils Relative: 0.6 % (ref 0.0–3.0)
Eosinophils Absolute: 0 10*3/uL (ref 0.0–0.7)
Eosinophils Relative: 0.4 % (ref 0.0–5.0)
HCT: 38.9 % (ref 36.0–46.0)
Hemoglobin: 12.4 g/dL (ref 12.0–15.0)
Lymphocytes Relative: 39.8 % (ref 12.0–46.0)
Lymphs Abs: 1.6 10*3/uL (ref 0.7–4.0)
MCHC: 31.9 g/dL (ref 30.0–36.0)
MCV: 85.9 fl (ref 78.0–100.0)
Monocytes Absolute: 0.3 10*3/uL (ref 0.1–1.0)
Monocytes Relative: 7 % (ref 3.0–12.0)
Neutro Abs: 2 10*3/uL (ref 1.4–7.7)
Neutrophils Relative %: 52.2 % (ref 43.0–77.0)
Platelets: 170 10*3/uL (ref 150.0–400.0)
RBC: 4.53 Mil/uL (ref 3.87–5.11)
RDW: 12.5 % (ref 11.5–15.5)
WBC: 3.9 10*3/uL — ABNORMAL LOW (ref 4.0–10.5)

## 2021-03-30 LAB — LIPID PANEL
Cholesterol: 150 mg/dL (ref 0–200)
HDL: 76.9 mg/dL (ref >39.00)
LDL Cholesterol: 60 mg/dL (ref 0–99)
NonHDL: 72.6
Total CHOL/HDL Ratio: 2
Triglycerides: 63 mg/dL (ref 0.0–149.0)
VLDL: 12.6 mg/dL (ref 0.0–40.0)

## 2021-03-30 LAB — TSH: TSH: 0.98 u[IU]/mL (ref 0.35–5.50)

## 2021-03-30 LAB — HEMOGLOBIN A1C: Hgb A1c MFr Bld: 5.1 % (ref 4.6–6.5)

## 2021-03-30 MED ORDER — HYDROCODONE-ACETAMINOPHEN 5-325 MG PO TABS: 1.0000 | ORAL_TABLET | Freq: Four times a day (QID) | ORAL | 0 refills | Status: DC | PRN

## 2021-03-30 MED ORDER — CARISOPRODOL 350 MG PO TABS
ORAL_TABLET | ORAL | 0 refills | Status: DC
Start: 2021-03-30 — End: 2021-08-06

## 2021-03-30 MED ORDER — ROSUVASTATIN CALCIUM 40 MG PO TABS: ORAL_TABLET | ORAL | 1 refills | Status: DC

## 2021-03-30 MED ORDER — EZETIMIBE 10 MG PO TABS: 10.0000 mg | ORAL_TABLET | Freq: Every day | ORAL | 1 refills | Status: DC

## 2021-03-30 NOTE — Assessment & Plan Note (Signed)
Stable Refill pain med uds and contract renewed

## 2021-03-30 NOTE — Assessment & Plan Note (Signed)
Well controlled, no changes to meds. Encouraged heart healthy diet such as the DASH diet and exercise as tolerated.  °

## 2021-03-30 NOTE — Assessment & Plan Note (Signed)
ghm utd Check labs  

## 2021-03-30 NOTE — Patient Instructions (Signed)
Preventive Care 68-60 Years Old, Female Preventive care refers to lifestyle choices and visits with your health care provider that can promote health and wellness. This includes: A yearly physical exam. This is also called an annual wellness visit. Regular dental and eye exams. Immunizations. Screening for certain conditions. Healthy lifestyle choices, such as: Eating a healthy diet. Getting regular exercise. Not using drugs or products that contain nicotine and tobacco. Limiting alcohol use. What can I expect for my preventive care visit? Physical exam Your health care provider will check your: Height and weight. These may be used to calculate your BMI (body mass index). BMI is a measurement that tells if you are at a healthy weight. Heart rate and blood pressure. Body temperature. Skin for abnormal spots. Counseling Your health care provider may ask you questions about your: Past medical problems. Family's medical history. Alcohol, tobacco, and drug use. Emotional well-being. Home life and relationship well-being. Sexual activity. Diet, exercise, and sleep habits. Work and work Statistician. Access to firearms. Method of birth control. Menstrual cycle. Pregnancy history. What immunizations do I need?  Vaccines are usually given at various ages, according to a schedule. Your health care provider will recommend vaccines for you based on your age, medicalhistory, and lifestyle or other factors, such as travel or where you work. What tests do I need? Blood tests Lipid and cholesterol levels. These may be checked every 5 years, or more often if you are over 37 years old. Hepatitis C test. Hepatitis B test. Screening Lung cancer screening. You may have this screening every year starting at age 30 if you have a 30-pack-year history of smoking and currently smoke or have quit within the past 15 years. Colorectal cancer screening. All adults should have this screening starting at  age 23 and continuing until age 3. Your health care provider may recommend screening at age 88 if you are at increased risk. You will have tests every 1-10 years, depending on your results and the type of screening test. Diabetes screening. This is done by checking your blood sugar (glucose) after you have not eaten for a while (fasting). You may have this done every 1-3 years. Mammogram. This may be done every 1-2 years. Talk with your health care provider about when you should start having regular mammograms. This may depend on whether you have a family history of breast cancer. BRCA-related cancer screening. This may be done if you have a family history of breast, ovarian, tubal, or peritoneal cancers. Pelvic exam and Pap test. This may be done every 3 years starting at age 79. Starting at age 54, this may be done every 5 years if you have a Pap test in combination with an HPV test. Other tests STD (sexually transmitted disease) testing, if you are at risk. Bone density scan. This is done to screen for osteoporosis. You may have this scan if you are at high risk for osteoporosis. Talk with your health care provider about your test results, treatment options,and if necessary, the need for more tests. Follow these instructions at home: Eating and drinking  Eat a diet that includes fresh fruits and vegetables, whole grains, lean protein, and low-fat dairy products. Take vitamin and mineral supplements as recommended by your health care provider. Do not drink alcohol if: Your health care provider tells you not to drink. You are pregnant, may be pregnant, or are planning to become pregnant. If you drink alcohol: Limit how much you have to 0-1 drink a day. Be aware  of how much alcohol is in your drink. In the [place] one drink equals one 12 oz bottle of beer (355 mL), one 5 oz glass of wine (148 mL), or one 1 oz glass of hard liquor (44 mL).  Lifestyle Take daily care of your teeth and  gums. Brush your teeth every morning and night with fluoride toothpaste. Floss one time each day. Stay active. Exercise for at least 30 minutes 5 or more days each week. Do not use any products that contain nicotine or tobacco, such as cigarettes, e-cigarettes, and chewing tobacco. If you need help quitting, ask your health care provider. Do not use drugs. If you are sexually active, practice safe sex. Use a condom or other form of protection to prevent STIs (sexually transmitted infections). If you do not wish to become pregnant, use a form of birth control. If you plan to become pregnant, see your health care provider for a prepregnancy visit. If told by your health care provider, take low-dose aspirin daily starting at age 29. Find healthy ways to cope with stress, such as: Meditation, yoga, or listening to music. Journaling. Talking to a trusted person. Spending time with friends and family. Safety Always wear your seat belt while driving or riding in a vehicle. Do not drive: If you have been drinking alcohol. Do not ride with someone who has been drinking. When you are tired or distracted. While texting. Wear a helmet and other protective equipment during sports activities. If you have firearms in your house, make sure you follow all gun safety procedures. What's next? Visit your health care provider once a year for an annual wellness visit. Ask your health care provider how often you should have your eyes and teeth checked. Stay up to date on all vaccines. This information is not intended to replace advice given to you by your health care provider. Make sure you discuss any questions you have with your healthcare provider. Document Revised: 05/23/2020 Document Reviewed: 04/30/2018 Elsevier Patient Education  2022 [employer].

## 2021-03-30 NOTE — Assessment & Plan Note (Signed)
Encourage heart healthy diet such as MIND or DASH diet, increase exercise, avoid trans fats, simple carbohydrates and processed foods, consider a krill or fish or flaxseed oil cap daily.  °

## 2021-03-30 NOTE — Progress Notes (Signed)
Patient ID: Yvonne Sanders, female    DOB: May 02, 1961  Age: 60 y.o. MRN: [id number]    Subjective:  Subjective  HPI Yvonne Sanders presents for a comprehensive physical examination today. She reports feeling well and has no complaints today. She endorses taking 40 mg Crestor PO QD and 10 mg zetia PO QD for her dx of HLD.  Lab Results  Component Value Date   CHOL 184 08/21/2020   CHOL 180 02/28/2020   CHOL 190 09/01/2019   Lab Results  Component Value Date   HDL 70.10 08/21/2020   HDL 64.60 02/28/2020   HDL 78.70 09/01/2019   Lab Results  Component Value Date   LDLCALC 101 (H) 08/21/2020   LDLCALC 101 (H) 02/28/2020   LDLCALC 93 09/01/2019   Lab Results  Component Value Date   TRIG 66.0 08/21/2020   TRIG 73.0 02/28/2020   TRIG 92.0 09/01/2019   Lab Results  Component Value Date   CHOLHDL 3 08/21/2020   CHOLHDL 3 02/28/2020   CHOLHDL 2 09/01/2019   No results found for: LDLDIRECT She notes taking 350 mg Soma PO QID PRN for muscle spasm. She reports that she rarely takes her pain management medication for her mid-back pain, since she doesn't have much back pain. She states that she is attending PT to improve her bilateral LE weakness and numbness. She reports that her numbness and weakness had decreased.  She denies any chest pain, SOB, fever, abdominal pain, cough, chills, sore throat, dysuria, urinary incontinence, back pain, HA, or N/V/D at this time. Pt is UTD with her gynecologist, dentist, ophthalmologist, and cardiologist.      Review of Systems  Constitutional:  Negative for chills, fatigue and fever.  HENT:  Negative for ear pain, rhinorrhea, sinus pressure, sinus pain, sore throat and tinnitus.   Eyes:  Negative for pain.  Respiratory:  Negative for cough, shortness of breath and wheezing.   Cardiovascular:  Negative for chest pain.  Gastrointestinal:  Negative for abdominal pain, anal bleeding, constipation, diarrhea, nausea and vomiting.  Genitourinary:   Negative for flank pain.  Musculoskeletal:  Positive for neck pain (secondary to chronic mid-back pain). Negative for back pain.  Skin:  Negative for rash.  Neurological:  Positive for weakness (secondary to chronic mid back pain) and numbness (secondary to chronic mid back pain). Negative for seizures, light-headedness and headaches.   History Past Medical History:  Diagnosis Date   Abnormal pap 1989   cryo   Arthritis    Cataract    Chicken pox    Endometriosis    Fibroid 1994   History of post uterine   Gallstones    GERD (gastroesophageal reflux disease)    Glaucoma    Headache(784.0)    Hyperlipidemia    Hypertension        Migraine    Positive TB test    Tibia fracture    Right leg.      She has a past surgical history that includes Cholecystectomy (88); Diagnostic laparoscopy (88); Appendectomy (75); Right oophorectomy (75); Lumbar fusion (08/2011); Lumbar fusion; Breast excisional biopsy (Right); and Glaucoma surgery (Left, 2019).   Her family history includes Breast cancer in her cousin, maternal aunt, and maternal grandmother; Diabetes in her maternal aunt and paternal aunt; Glaucoma in her father; Heart disease in her brother and father; Hyperlipidemia in her mother and sister; Hypertension in her brother, brother, father, mother, sister, and sister; Kidney disease in her father; Macular degeneration in her father; Prostate  cancer in her father; Renal Disease in her father; Stroke in her father.She reports that she has never smoked. She has never used smokeless tobacco. She reports previous alcohol use. She reports that she does not use drugs.  Current Outpatient Medications on File Prior to Visit  Medication Sig Dispense Refill   ascorbic acid (VITAMIN C) 500 MG tablet Take 500 mg by mouth daily.     aspirin EC 81 MG tablet Take 1 tablet (81 mg total) by mouth daily. 90 tablet 3   aspirin-acetaminophen-caffeine (EXCEDRIN MIGRAINE) 250-250-65 MG tablet Take 1 tablet by  mouth every 6 (six) hours as needed for headache or migraine.     Biotin 5000 MCG CAPS Take 5,000 mcg by mouth daily.     Brinzolamide-Brimonidine 1-0.2 % SUSP Place 1 drop into both eyes 3 (three) times daily.      Calcium Carbonate Antacid (TUMS PO) Take 1 tablet by mouth daily as needed (heartburn).      Cholecalciferol (VITAMIN D3) 2000 units TABS Take 1 tablet by mouth daily.     cycloSPORINE (RESTASIS) 0.05 % ophthalmic emulsion Place 1 drop into both eyes 2 (two) times daily.     diclofenac Sodium (VOLTAREN) 1 % GEL Apply 4 g topically 4 (four) times daily. 350 g 2   esomeprazole (NEXIUM) 40 MG capsule Take 1 capsule (40 mg total) by mouth daily. 90 capsule 3   esomeprazole (NEXIUM) 40 MG capsule Take 1 capsule by mouth once daily 30 capsule 5   Evolocumab (REPATHA SURECLICK) [id number] MG/ML SOAJ Inject 1 mL into the skin every 14 (fourteen) days. 6 mL 3   folic acid (FOLVITE) Q000111Q MCG tablet Take 400 mcg by mouth daily.     ibuprofen (ADVIL) 200 MG tablet Take 600 mg by mouth as needed.     Magnesium 100 MG CAPS Take 1 capsule by mouth daily.     metoprolol succinate (TOPROL-XL) 50 MG 24 hr tablet TAKE 1 TABLET BY MOUTH EVERY DAY WITH OR IMMEDIATELY FOLLOWING A MEAL 90 tablet 3   Multiple Vitamins-Minerals (MULTIVITAMINS THER. W/MINERALS) TABS Take 1 tablet by mouth daily.     Netarsudil-Latanoprost 0.02-0.005 % SOLN Apply 1 drop to eye at bedtime.     vitamin B-12 (CYANOCOBALAMIN) 100 MCG tablet Take 100 mcg by mouth daily. Unsure of strength     zinc gluconate 50 MG tablet Take 50 mg by mouth daily.     esomeprazole (NEXIUM) 40 MG capsule Take 1 capsule (40 mg total) by mouth 2 (two) times daily before a meal for 70 days, THEN 1 capsule (40 mg total) daily at 12 noon for 7 days. 147 capsule 0   No current facility-administered medications on file prior to visit.     Objective:  Objective  Physical Exam Vitals and nursing note reviewed.  Constitutional:      General: She is not in acute  distress.    Appearance: Normal appearance. She is well-developed. She is not ill-appearing.  HENT:     Head: Normocephalic and atraumatic.     Right Ear: Tympanic membrane, ear canal and external ear normal.     Left Ear: Tympanic membrane, ear canal and external ear normal.     Nose: Nose normal.  Eyes:     General:        Right eye: No discharge.        Left eye: No discharge.     Extraocular Movements: Extraocular movements intact.     Pupils: Pupils are  equal, round, and reactive to light.  Cardiovascular:     Rate and Rhythm: Normal rate and regular rhythm.     Pulses: Normal pulses.     Heart sounds: Normal heart sounds. No murmur heard.   No friction rub. No gallop.  Pulmonary:     Effort: Pulmonary effort is normal. No respiratory distress.     Breath sounds: Normal breath sounds. No stridor. No wheezing, rhonchi or rales.  Chest:     Chest wall: No tenderness.  Abdominal:     General: Bowel sounds are normal. There is no distension.     Palpations: Abdomen is soft. There is no mass.     Tenderness: There is no abdominal tenderness. There is no guarding or rebound.     Hernia: No hernia is present.  Musculoskeletal:        General: Normal range of motion.     Cervical back: Normal range of motion and neck supple.     Right lower leg: No edema.     Left lower leg: No edema.  Skin:    General: Skin is warm and dry.  Neurological:     Mental Status: She is alert and oriented to person, place, and time.  Psychiatric:        Behavior: Behavior normal.        Thought Content: Thought content normal.   BP (!) 155/71 (BP Location: Left Arm, Patient Position: Sitting, Cuff Size: Small)   Pulse 68   Temp 98.8 F (37.1 C) (Oral)   Resp 16   Ht '5\' 5"'$  (1.651 m)   Wt 206 lb (93.4 kg)   LMP 09/03/2007 (Approximate)   SpO2 100%   BMI 34.28 kg/m  Wt Readings from Last 3 Encounters:  03/30/21 206 lb (93.4 kg)  01/02/21 198 lb 9.6 oz (90.1 kg)  10/18/20 201 lb (91.2 kg)      Lab Results  Component Value Date   WBC 5.1 08/21/2020   HGB 11.5 (L) 08/21/2020   HCT 36.0 08/21/2020   PLT 187.0 08/21/2020   GLUCOSE 92 08/21/2020   CHOL 184 08/21/2020   TRIG 66.0 08/21/2020   HDL 70.10 08/21/2020   LDLCALC 101 (H) 08/21/2020   ALT 30 08/21/2020   AST 23 08/21/2020   NA 141 08/21/2020   K 4.2 08/21/2020   CL 109 08/21/2020   CREATININE 0.67 08/21/2020   BUN 12 08/21/2020   CO2 26 08/21/2020   TSH 0.84 02/28/2020   HGBA1C 5.0 09/29/2020    DG Cervical Spine Complete  Result Date: 01/05/2021 CLINICAL DATA:  Neck pain with radiculopathy. EXAM: CERVICAL SPINE - COMPLETE 4+ VIEW COMPARISON:  12/26/2006 FINDINGS: Straightening of the lumbar lordosis with mild kyphosis. Normal alignment. No fracture or mass. Prevertebral soft tissues normal. Multilevel disc degeneration and spurring which has progressed since 2008. Disc degeneration and spurring most prominent C4 through C7. Mild right foraminal narrowing C3-4. Moderate right foraminal narrowing C4-5 and C5-6. Mild right foraminal narrowing C6-7. Mild left foraminal narrowing C3-4. Moderate left foraminal narrowing C4-5, C5-6, C6-7. IMPRESSION: Progression of cervical spondylosis. Bilateral foraminal encroachment due to spurring. Electronically Signed   By: Franchot Gallo M.D.   On: 01/05/2021 09:09   DG Thoracic Spine 2 View  Result Date: 01/05/2021 CLINICAL DATA:  Mid back pain EXAM: THORACIC SPINE 2 VIEWS COMPARISON:  12/26/2006 FINDINGS: Normal alignment. Mild dextroscoliosis. Negative for fracture or mass Extensive multilevel disc degeneration and spurring throughout the thoracic spine with significant progression since 2008.  IMPRESSION: Extensive thoracic disc degeneration and spurring, with significant progression since 2008 Electronically Signed   By: Franchot Gallo M.D.   On: 01/05/2021 09:10   DG Ankle Complete Right  Result Date: 01/05/2021 CLINICAL DATA:  Twisted ankle.  Right ankle swelling EXAM: RIGHT  ANKLE - COMPLETE 3+ VIEW COMPARISON:  None. FINDINGS: Negative for acute fracture. There is a joint effusion and lateral soft tissue swelling. There is mild calcification in the Achilles tendon. IMPRESSION: No acute fracture. Joint effusion with lateral soft tissue swelling. Electronically Signed   By: Franchot Gallo M.D.   On: 01/05/2021 09:05     Assessment & Plan:  Plan    Meds ordered this encounter  Medications   rosuvastatin (CRESTOR) 40 MG tablet    Sig: 1 po qd    Dispense:  90 tablet    Refill:  1   ezetimibe (ZETIA) 10 MG tablet    Sig: Take 1 tablet (10 mg total) by mouth daily. TAKE 1 TABLET(10 MG) BY MOUTH DAILY    Dispense:  90 tablet    Refill:  1   HYDROcodone-acetaminophen (NORCO/VICODIN) 5-325 MG tablet    Sig: Take 1 tablet by mouth every 6 (six) hours as needed for moderate pain.    Dispense:  30 tablet    Refill:  0   carisoprodol (SOMA) 350 MG tablet    Sig: TAKE 1 TABLET(350 MG) BY MOUTH FOUR TIMES DAILY AS NEEDED FOR MUSCLE SPASMS    Dispense:  60 tablet    Refill:  0    Problem List Items Addressed This Visit       Unprioritized   Chronic pain syndrome   Relevant Medications   HYDROcodone-acetaminophen (NORCO/VICODIN) 5-325 MG tablet   carisoprodol (SOMA) 350 MG tablet   Other Relevant Orders   DRUG MONITORING, PANEL 8 WITH CONFIRMATION, URINE   HTN (hypertension)    Well controlled, no changes to meds. Encouraged heart healthy diet such as the DASH diet and exercise as tolerated.        Relevant Medications   rosuvastatin (CRESTOR) 40 MG tablet   ezetimibe (ZETIA) 10 MG tablet   Hyperlipidemia    Encourage heart healthy diet such as MIND or DASH diet, increase exercise, avoid trans fats, simple carbohydrates and processed foods, consider a krill or fish or flaxseed oil cap daily.        Relevant Medications   rosuvastatin (CRESTOR) 40 MG tablet   ezetimibe (ZETIA) 10 MG tablet   HYDROcodone-acetaminophen (NORCO/VICODIN) 5-325 MG tablet    Other Relevant Orders   TSH   Lipid panel   Lumbar foraminal stenosis    Stable Refill pain med uds and contract renewed        Relevant Medications   carisoprodol (SOMA) 350 MG tablet   Other Relevant Orders   DRUG MONITORING, PANEL 8 WITH CONFIRMATION, URINE   Numbness and tingling of right arm    ghm utd Check labs        Preventative health care - Primary   Relevant Medications   carisoprodol (SOMA) 350 MG tablet   Other Relevant Orders   TSH   Lipid panel   CBC with Differential/Platelet   Comprehensive metabolic panel   Hemoglobin A1c   Type 2 diabetes mellitus with hyperglycemia, without long-term current use of insulin (HCC)   Relevant Medications   rosuvastatin (CRESTOR) 40 MG tablet   Other Relevant Orders   Comprehensive metabolic panel   Hemoglobin A1c    Colonoscopy:  Last completed on 06/29/2020, polyps noted, repeat every 5 years.   Mammo: Last completed on 04/13/2020, results were normal, repeat every 1 years.   Pap Smear: Last completed on 09/22/2019, results were normal.   Follow-up: Return in about 3 months (around 06/30/2021), or if symptoms worsen or fail to improve, for f/u pain med.   I,Yvonne Sanders,acting as a Education administrator for [employer], DO.,have documented all relevant documentation on the behalf of Yvonne Held, DO,as directed by  Yvonne Held, DO while in the presence of [place], DO, have reviewed all documentation for this visit. The documentation on 03/30/21 for the exam, diagnosis, procedures, and orders are all accurate and complete.

## 2021-03-31 LAB — DRUG MONITORING, PANEL 8 WITH CONFIRMATION, URINE
6 Acetylmorphine: NEGATIVE ng/mL (ref <10)
Alcohol Metabolites: NEGATIVE ng/mL (ref <500)
Amphetamines: NEGATIVE ng/mL (ref <500)
Benzodiazepines: NEGATIVE ng/mL (ref <100)
Buprenorphine, Urine: NEGATIVE ng/mL (ref <5)
Cocaine Metabolite: NEGATIVE ng/mL (ref <150)
Creatinine: 46.5 mg/dL (ref >=20.0)
MDMA: NEGATIVE ng/mL (ref <500)
Marijuana Metabolite: NEGATIVE ng/mL (ref <20)
Opiates: NEGATIVE ng/mL (ref <100)
Oxidant: NEGATIVE ug/mL (ref <200)
Oxycodone: NEGATIVE ng/mL (ref <100)
pH: 6.8 (ref 4.5–9.0)

## 2021-03-31 LAB — DM TEMPLATE

## 2021-04-02 ENCOUNTER — Telehealth: Payer: Self-pay | Admitting: Pharmacist

## 2021-04-02 NOTE — Telephone Encounter (Signed)
Called pt to review her lipid labs. LDL is down to 60. Continue rosuvastatin '40mg'$ , zetia '10mg'$  daily and Repatha '140mg'$  q 14 days. Left VM for pt to call back

## 2021-04-03 NOTE — Telephone Encounter (Signed)
Patient called back and I reviewed results with her

## 2021-05-16 ENCOUNTER — Other Ambulatory Visit: Payer: Self-pay | Admitting: Family Medicine

## 2021-05-16 DIAGNOSIS — Z1231 Encounter for screening mammogram for malignant neoplasm of breast: Secondary | ICD-10-CM

## 2021-06-15 ENCOUNTER — Telehealth: Payer: Self-pay | Admitting: Interventional Cardiology

## 2021-06-15 DIAGNOSIS — E7849 Other hyperlipidemia: Secondary | ICD-10-CM

## 2021-06-15 NOTE — Telephone Encounter (Signed)
Pt c/o medication issue:  1. Name of Medication: Evolocumab (REPATHA SURECLICK) 500 MG/ML SOAJ  2. How are you currently taking this medication (dosage and times per day)? Patient has not started taking the medication yet   3. Are you having a reaction (difficulty breathing--STAT)?   4. What is your medication issue? [place] was checking the status of the prior authorization they submitted for this medication

## 2021-06-15 NOTE — Telephone Encounter (Signed)
PA was submitted on 10/10, still awaiting determination

## 2021-06-15 NOTE — Telephone Encounter (Signed)
LM for pt, PA still in process.

## 2021-06-15 NOTE — Telephone Encounter (Signed)
Patient called about prior-auth on repatha.

## 2021-06-18 NOTE — Telephone Encounter (Signed)
Called and lmomed the pt that the repatha was denied. We had never received the denial and I called the insurance company who stated that they would be faxing over the information to [place] so that we can appeal it to the [phone] nubmer so I will route to megan supple to make her aware to look out for the fax.

## 2021-06-19 NOTE — Telephone Encounter (Signed)
Faxed appeal to [employer] [phone]

## 2021-06-19 NOTE — Telephone Encounter (Signed)
Denial letter received. Repatha denied stating that it wasn't shown that pt's LDL decreased while on therapy. Unsure what labs were sent in with PA request because pt's LDL improved to 60 on Repatha from a baseline of 101-165. Please call to have decision overturned.

## 2021-06-20 MED ORDER — REPATHA SURECLICK 140 MG/ML ~~LOC~~ SOAJ
1.0000 mL | SUBCUTANEOUS | 3 refills | Status: DC
Start: 2021-06-20 — End: 2021-12-05

## 2021-06-20 NOTE — Addendum Note (Signed)
Addended by: Harbert Fitterer E on: 06/20/2021 10:25 AM   Modules accepted: Orders

## 2021-06-20 NOTE — Telephone Encounter (Signed)
Appeals overturned, Repatha now approved through 06/19/22. Refill sent to pharmacy, LM for pt making her aware.

## 2021-06-21 ENCOUNTER — Other Ambulatory Visit: Payer: Self-pay

## 2021-06-21 ENCOUNTER — Ambulatory Visit
Admission: RE | Admit: 2021-06-21 | Discharge: 2021-06-21 | Disposition: A | Discharge status: Home / Self Care | Payer: No Typology Code available for payment source | Source: Ambulatory Visit | Attending: Family Medicine | Admitting: Family Medicine

## 2021-06-21 DIAGNOSIS — Z1231 Encounter for screening mammogram for malignant neoplasm of breast: Secondary | ICD-10-CM

## 2021-08-02 ENCOUNTER — Other Ambulatory Visit: Payer: Self-pay | Admitting: Family Medicine

## 2021-08-02 DIAGNOSIS — Z0000 Encounter for general adult medical examination without abnormal findings: Secondary | ICD-10-CM

## 2021-08-02 DIAGNOSIS — M48061 Spinal stenosis, lumbar region without neurogenic claudication: Secondary | ICD-10-CM

## 2021-08-03 NOTE — Telephone Encounter (Signed)
Requesting: Soma Contract: 03/30/21 UDS: 03/30/21 Last OV: 03/30/21 Next OV: 10/02/21  Last Refill: 03/30/2021, #60--0 RF Database:   Please advise

## 2021-09-22 ENCOUNTER — Other Ambulatory Visit: Payer: Self-pay | Admitting: Family Medicine

## 2021-09-22 DIAGNOSIS — E785 Hyperlipidemia, unspecified: Secondary | ICD-10-CM

## 2021-10-02 ENCOUNTER — Ambulatory Visit (INDEPENDENT_AMBULATORY_CARE_PROVIDER_SITE_OTHER): Payer: No Typology Code available for payment source | Admitting: Family Medicine

## 2021-10-02 ENCOUNTER — Encounter: Payer: Self-pay | Admitting: Family Medicine

## 2021-10-02 VITALS — BP 140/80 | HR 63 | Temp 98.6°F | Resp 18 | Ht 65.0 in | Wt 202.4 lb

## 2021-10-02 DIAGNOSIS — M48061 Spinal stenosis, lumbar region without neurogenic claudication: Secondary | ICD-10-CM | POA: Diagnosis not present

## 2021-10-02 DIAGNOSIS — E785 Hyperlipidemia, unspecified: Secondary | ICD-10-CM

## 2021-10-02 DIAGNOSIS — I10 Essential (primary) hypertension: Secondary | ICD-10-CM

## 2021-10-02 LAB — LIPID PANEL
Cholesterol: 137 mg/dL (ref 0–200)
HDL: 70.2 mg/dL (ref >39.00)
LDL Cholesterol: 51 mg/dL (ref 0–99)
NonHDL: 66.41
Total CHOL/HDL Ratio: 2
Triglycerides: 75 mg/dL (ref 0.0–149.0)
VLDL: 15 mg/dL (ref 0.0–40.0)

## 2021-10-02 LAB — COMPREHENSIVE METABOLIC PANEL
ALT: 30 U/L (ref 0–35)
AST: 24 U/L (ref 0–37)
Albumin: 4.3 g/dL (ref 3.5–5.2)
Alkaline Phosphatase: 67 U/L (ref 39–117)
BUN: 10 mg/dL (ref 6–23)
CO2: 28 mEq/L (ref 19–32)
Calcium: 9.6 mg/dL (ref 8.4–10.5)
Chloride: 107 mEq/L (ref 96–112)
Creatinine, Ser: 0.66 mg/dL (ref 0.40–1.20)
GFR: 95.38 mL/min (ref >60.00)
Glucose, Bld: 101 mg/dL — ABNORMAL HIGH (ref 70–99)
Potassium: 4.2 mEq/L (ref 3.5–5.1)
Sodium: 141 mEq/L (ref 135–145)
Total Bilirubin: 0.5 mg/dL (ref 0.2–1.2)
Total Protein: 7 g/dL (ref 6.0–8.3)

## 2021-10-02 MED ORDER — ROSUVASTATIN CALCIUM 40 MG PO TABS: ORAL_TABLET | ORAL | 0 refills | Status: DC

## 2021-10-02 MED ORDER — EZETIMIBE 10 MG PO TABS: 10.0000 mg | ORAL_TABLET | Freq: Every day | ORAL | 0 refills | Status: DC

## 2021-10-02 MED ORDER — CARISOPRODOL 350 MG PO TABS: ORAL_TABLET | ORAL | 0 refills | Status: DC

## 2021-10-02 MED ORDER — ESOMEPRAZOLE MAGNESIUM 40 MG PO CPDR: 40.0000 mg | DELAYED_RELEASE_CAPSULE | Freq: Every day | ORAL | 0 refills | Status: DC

## 2021-10-02 NOTE — Assessment & Plan Note (Signed)
Encourage heart healthy diet such as MIND or DASH diet, increase exercise, avoid trans fats, simple carbohydrates and processed foods, consider a krill or fish or flaxseed oil cap daily.  °

## 2021-10-02 NOTE — Patient Instructions (Signed)

## 2021-10-02 NOTE — Assessment & Plan Note (Signed)
Well controlled, no changes to meds. Encouraged heart healthy diet such as the DASH diet and exercise as tolerated.  °

## 2021-10-02 NOTE — Assessment & Plan Note (Signed)
Refill soma stable

## 2021-10-02 NOTE — Progress Notes (Signed)
Established Patient Office Visit  Subjective:  Patient ID: Yvonne Sanders, female    DOB: 1961-03-02  Age: 61 y.o. MRN: [id number]  CC:  Chief Complaint  Patient presents with   Hypertension   Hyperlipidemia   Follow-up    HPI Oscar B Funke presents for f/u bp , cholesterol   HYPERTENSION  Blood pressure range-not checking  Chest pain- no      Dyspnea- no Lightheadedness- no   Edema- no Other side effects - no   Medication compliance: good Low salt diet- yes   HYPERLIPIDEMIA  Medication compliance- good RUQ pain- no  Muscle aches- no Other side effects-no    Past Medical History:  Diagnosis Date   Abnormal pap 1989   cryo   Arthritis    Cataract    Chicken pox    Endometriosis    Fibroid 1994   History of post uterine   Gallstones    GERD (gastroesophageal reflux disease)    Glaucoma    Headache(784.0)    Hyperlipidemia    Hypertension        Migraine    Positive TB test    Tibia fracture    Right leg.      Past Surgical History:  Procedure Laterality Date   APPENDECTOMY  8   BREAST EXCISIONAL BIOPSY Right    CHOLECYSTECTOMY  88   DIAGNOSTIC LAPAROSCOPY  88   endometrious   GLAUCOMA SURGERY Left 2019   LUMBAR FUSION  08/2011   l 3-4   LUMBAR FUSION     l 4-5   RIGHT OOPHORECTOMY  75    Family History  Problem Relation Age of Onset   Hyperlipidemia Mother    Hypertension Mother    Hypertension Father    Glaucoma Father    Prostate cancer Father    Renal Disease Father    Macular degeneration Father    Stroke Father    Kidney disease Father    Heart disease Father    Kidney failure Father    Hypertension Sister    Hyperlipidemia Sister    Hypertension Sister    Hypertension Brother    Heart disease Brother    Hypertension Brother    Breast cancer Maternal Grandmother    Breast cancer Maternal Aunt    Diabetes Maternal Aunt    Diabetes Paternal Aunt    Breast cancer Cousin    Colon cancer Neg Hx    Colon polyps Neg  Hx    Stomach cancer Neg Hx    Esophageal cancer Neg Hx     Social History   Socioeconomic History   Marital status: Married    Spouse name: Not on file   Number of children: 1   Years of education: Not on file   Highest education level: Not on file  Occupational History   Occupation: Surgical coodinator  Tobacco Use   Smoking status: Never   Smokeless tobacco: Never  Vaping Use   Vaping Use: Never used  Substance and Sexual Activity   Alcohol use: Not Currently   Drug use: No   Sexual activity: Yes    Partners: Male    Birth control/protection: Post-menopausal  Other Topics Concern   Not on file  Social History Narrative   Not on file   Social Determinants of Health   Financial Resource Strain: Not on file  Food Insecurity: Not on file  Transportation Needs: Not on file  Physical Activity: Not on file  Stress: Not on  file  Social Connections: Not on file  Intimate Partner Violence: Not on file    Outpatient Medications Prior to Visit  Medication Sig Dispense Refill   ascorbic acid (VITAMIN C) 500 MG tablet Take 500 mg by mouth daily.     aspirin-acetaminophen-caffeine (EXCEDRIN MIGRAINE) 250-250-65 MG tablet Take 1 tablet by mouth every 6 (six) hours as needed for headache or migraine.     Biotin 5000 MCG CAPS Take 5,000 mcg by mouth daily.     Brinzolamide-Brimonidine 1-0.2 % SUSP Place 1 drop into both eyes 3 (three) times daily.      Calcium Carbonate Antacid (TUMS PO) Take 1 tablet by mouth daily as needed (heartburn).      Cholecalciferol (VITAMIN D3) 2000 units TABS Take 1 tablet by mouth daily.     cycloSPORINE (RESTASIS) 0.05 % ophthalmic emulsion Place 1 drop into both eyes 2 (two) times daily.     diclofenac Sodium (VOLTAREN) 1 % GEL Apply 4 g topically 4 (four) times daily. 350 g 2   Evolocumab (REPATHA SURECLICK) 591 MG/ML SOAJ Inject 1 mL into the skin every 14 (fourteen) days. 6 mL 3   folic acid (FOLVITE) 638 MCG tablet Take 400 mcg by mouth daily.      HYDROcodone-acetaminophen (NORCO/VICODIN) 5-325 MG tablet Take 1 tablet by mouth every 6 (six) hours as needed for moderate pain. 30 tablet 0   ibuprofen (ADVIL) 200 MG tablet Take 600 mg by mouth as needed.     Magnesium 100 MG CAPS Take 1 capsule by mouth daily.     metoprolol succinate (TOPROL-XL) 50 MG 24 hr tablet TAKE 1 TABLET BY MOUTH EVERY DAY WITH OR IMMEDIATELY FOLLOWING A MEAL 90 tablet 3   Multiple Vitamins-Minerals (MULTIVITAMINS THER. W/MINERALS) TABS Take 1 tablet by mouth daily.     Netarsudil-Latanoprost 0.02-0.005 % SOLN Apply 1 drop to eye at bedtime.     vitamin B-12 (CYANOCOBALAMIN) 100 MCG tablet Take 100 mcg by mouth daily. Unsure of strength     zinc gluconate 50 MG tablet Take 50 mg by mouth daily.     carisoprodol (SOMA) 350 MG tablet TAKE 1 TABLET BY MOUTH 4 TIMES DAILY AS NEEDED FOR MUSCLE SPASM 60 tablet 0   esomeprazole (NEXIUM) 40 MG capsule Take 1 capsule by mouth once daily 30 capsule 0   ezetimibe (ZETIA) 10 MG tablet Take 1 tablet by mouth once daily 30 tablet 0   rosuvastatin (CRESTOR) 40 MG tablet Take 1 tablet by mouth once daily 30 tablet 0   aspirin EC 81 MG tablet Take 1 tablet (81 mg total) by mouth daily. (Patient not taking: Reported on 10/02/2021) 90 tablet 3   No facility-administered medications prior to visit.    Allergies  Allergen Reactions   Nubain [Nalbuphine Hcl] Anaphylaxis   Corticosteroids Other (See Comments)    Due to glaucoma can not have steroids   Dilaudid [Hydromorphone Hcl] Nausea And Vomiting    Severe itching   Nubain [Nalbuphine Hcl] Nausea And Vomiting   Oxycodone-Acetaminophen Itching   Percodan [Oxycodone-Aspirin] Hives and Itching   Tomato Rash    Rash, spots on tongue    ROS Review of Systems  Constitutional:  Negative for chills and fever.  HENT:  Negative for congestion and hearing loss.   Eyes:  Negative for discharge.  Respiratory:  Negative for cough and shortness of breath.   Cardiovascular:   Negative for chest pain, palpitations and leg swelling.  Gastrointestinal:  Negative for abdominal pain, blood  in stool, constipation, diarrhea, nausea and vomiting.  Genitourinary:  Negative for dysuria, frequency, hematuria and urgency.  Musculoskeletal:  Negative for back pain and myalgias.  Skin:  Negative for rash.  Allergic/Immunologic: Negative for environmental allergies.  Neurological:  Negative for dizziness, weakness and headaches.  Hematological:  Does not bruise/bleed easily.  Psychiatric/Behavioral:  Negative for suicidal ideas. The patient is not nervous/anxious.      Objective:    Physical Exam Vitals and nursing note reviewed.  Constitutional:      Appearance: She is well-developed.  HENT:     Head: Normocephalic and atraumatic.  Eyes:     Conjunctiva/sclera: Conjunctivae normal.  Neck:     Thyroid: No thyromegaly.     Vascular: No carotid bruit or JVD.  Cardiovascular:     Rate and Rhythm: Normal rate and regular rhythm.     Heart sounds: Normal heart sounds. No murmur heard. Pulmonary:     Effort: Pulmonary effort is normal. No respiratory distress.     Breath sounds: Normal breath sounds. No wheezing or rales.  Chest:     Chest wall: No tenderness.  Musculoskeletal:     Cervical back: Normal range of motion and neck supple.  Neurological:     Mental Status: She is alert and oriented to person, place, and time.    BP 140/80 (BP Location: Left Arm, Patient Position: Sitting, Cuff Size: Large)    Pulse 63    Temp 98.6 F (37 C) (Oral)    Resp 18    Ht 5\' 5"  (1.651 m)    Wt 202 lb 6.4 oz (91.8 kg)    LMP 09/03/2007 (Approximate)    SpO2 97%    BMI 33.68 kg/m  Wt Readings from Last 3 Encounters:  10/02/21 202 lb 6.4 oz (91.8 kg)  03/30/21 206 lb (93.4 kg)  01/02/21 198 lb 9.6 oz (90.1 kg)     Health Maintenance Due  Topic Date Due   URINE MICROALBUMIN  Never done    There are no preventive care reminders to display for this patient.  Lab Results   Component Value Date   TSH 0.98 03/30/2021   Lab Results  Component Value Date   WBC 3.9 (L) 03/30/2021   HGB 12.4 03/30/2021   HCT 38.9 03/30/2021   MCV 85.9 03/30/2021   PLT 170.0 03/30/2021   Lab Results  Component Value Date   NA 142 03/30/2021   K 4.2 03/30/2021   CO2 27 03/30/2021   GLUCOSE 87 03/30/2021   BUN 9 03/30/2021   CREATININE 0.63 03/30/2021   BILITOT 0.6 03/30/2021   ALKPHOS 76 03/30/2021   AST 31 03/30/2021   ALT 37 (H) 03/30/2021   PROT 7.2 03/30/2021   ALBUMIN 4.4 03/30/2021   CALCIUM 9.8 03/30/2021   GFR 96.80 03/30/2021   Lab Results  Component Value Date   CHOL 150 03/30/2021   Lab Results  Component Value Date   HDL 76.90 03/30/2021   Lab Results  Component Value Date   LDLCALC 60 03/30/2021   Lab Results  Component Value Date   TRIG 63.0 03/30/2021   Lab Results  Component Value Date   CHOLHDL 2 03/30/2021   Lab Results  Component Value Date   HGBA1C 5.1 03/30/2021      Assessment & Plan:   Problem List Items Addressed This Visit       Unprioritized   HTN (hypertension)    Well controlled, no changes to meds. Encouraged heart healthy diet  such as the DASH diet and exercise as tolerated.       Relevant Medications   rosuvastatin (CRESTOR) 40 MG tablet   ezetimibe (ZETIA) 10 MG tablet   Other Relevant Orders   Lipid panel   Comprehensive metabolic panel   Hyperlipidemia - Primary    Encourage heart healthy diet such as MIND or DASH diet, increase exercise, avoid trans fats, simple carbohydrates and processed foods, consider a krill or fish or flaxseed oil cap daily.       Relevant Medications   rosuvastatin (CRESTOR) 40 MG tablet   ezetimibe (ZETIA) 10 MG tablet   Other Relevant Orders   Lipid panel   Lumbar foraminal stenosis    Refill soma stable      Relevant Medications   carisoprodol (SOMA) 350 MG tablet    Meds ordered this encounter  Medications   rosuvastatin (CRESTOR) 40 MG tablet    Sig: Take  1 tablet by mouth once daily    Dispense:  30 tablet    Refill:  0    Requested drug refills are authorized, however, the patient needs further evaluation and/or laboratory testing before further refills are given. Ask her to make an appointment for this.   ezetimibe (ZETIA) 10 MG tablet    Sig: Take 1 tablet (10 mg total) by mouth daily.    Dispense:  30 tablet    Refill:  0    Requested drug refills are authorized, however, the patient needs further evaluation and/or laboratory testing before further refills are given. Ask her to make an appointment for this.   esomeprazole (NEXIUM) 40 MG capsule    Sig: Take 1 capsule (40 mg total) by mouth daily.    Dispense:  30 capsule    Refill:  0    Requested drug refills are authorized, however, the patient needs further evaluation and/or laboratory testing before further refills are given. Ask her to make an appointment for this.   carisoprodol (SOMA) 350 MG tablet    Sig: TAKE 1 TABLET BY MOUTH 4 TIMES DAILY AS NEEDED FOR MUSCLE SPASM    Dispense:  60 tablet    Refill:  0    Follow-up: Return in about 6 months (around 04/01/2022), or if symptoms worsen or fail to improve, for annual exam, fasting.    Ann Held, DO

## 2021-10-23 NOTE — Progress Notes (Signed)
61 y.o. G106P1001 Married [place] American female here for annual exam.    Wants a pap today, even if insurance will not pay.   Prolapse is ok.   Taking Vicodin for neck and back pain prn. Sees neurosurgeon tomorrow.   Father passed away last month.  He had been living with patient for 12 years.    PCP: Garnet Koyanagi Chase DO    Patient's last menstrual period was 09/03/2007 (approximate).           Sexually active: Yes.    The current method of family planning is post menopausal status.    Exercising: No.  The patient does not participate in regular exercise at present. Smoker:  no  Health Maintenance: Pap:   09/22/19 Neg:Neg HR HPV, 09-09-18 Neg:Neg HR HPV,08-07-17 Neg:Neg HR HPV  History of abnormal Pap:  yes, Hx LGSIL 2011, 2012, 2013.  Pap ASCUS in 2014, and then normal in 2015 and 2016. MMG:  06-21-21 Neg/Birads1, cat B density.  Colonoscopy:  06-29-20 polyps removed;next 3-5 years BMD:   n/a  Result  n/a TDaP:  09-22-14 Gardasil:   no HIV: Neg years ago Hep C: 10-12-15 Neg Screening Labs:  PCP Flu vaccine:  completed.  Covid series:  completed.  Shingrix:  completed.    reports that she has never smoked. She has never used smokeless tobacco. She reports that she does not currently use alcohol. She reports that she does not use drugs.  Past Medical History:  Diagnosis Date   Abnormal pap 1989   cryo   Arthritis    Cataract    Chicken pox    Endometriosis    Fibroid 1994   History of post uterine   Gallstones    GERD (gastroesophageal reflux disease)    Glaucoma    Headache(784.0)    Hyperlipidemia    Hypertension        Migraine    Positive TB test    Tibia fracture    Right leg.      Past Surgical History:  Procedure Laterality Date   APPENDECTOMY  107   BREAST EXCISIONAL BIOPSY Right    CHOLECYSTECTOMY  88   DIAGNOSTIC LAPAROSCOPY  88   endometrious   GLAUCOMA SURGERY Left 2019   LUMBAR FUSION  08/2011   l 3-4   LUMBAR FUSION     l 4-5   RIGHT  OOPHORECTOMY  75    Current Outpatient Medications  Medication Sig Dispense Refill   ascorbic acid (VITAMIN C) 500 MG tablet Take 500 mg by mouth daily.     aspirin-acetaminophen-caffeine (EXCEDRIN MIGRAINE) 250-250-65 MG tablet Take 1 tablet by mouth every 6 (six) hours as needed for headache or migraine.     Biotin 5000 MCG CAPS Take 5,000 mcg by mouth daily.     Brinzolamide-Brimonidine 1-0.2 % SUSP Place 1 drop into both eyes 3 (three) times daily.      Calcium Carbonate Antacid (TUMS PO) Take 1 tablet by mouth daily as needed (heartburn).      carisoprodol (SOMA) 350 MG tablet TAKE 1 TABLET BY MOUTH 4 TIMES DAILY AS NEEDED FOR MUSCLE SPASM 60 tablet 0   Cholecalciferol (VITAMIN D3) 2000 units TABS Take 1 tablet by mouth daily.     cycloSPORINE (RESTASIS) 0.05 % ophthalmic emulsion Place 1 drop into both eyes 2 (two) times daily.     diclofenac Sodium (VOLTAREN) 1 % GEL Apply 4 g topically 4 (four) times daily. 350 g 2   esomeprazole (NEXIUM)  40 MG capsule Take 1 capsule (40 mg total) by mouth daily. 30 capsule 0   Evolocumab (REPATHA SURECLICK) 604 MG/ML SOAJ Inject 1 mL into the skin every 14 (fourteen) days. 6 mL 3   ezetimibe (ZETIA) 10 MG tablet Take 1 tablet (10 mg total) by mouth daily. 30 tablet 0   folic acid (FOLVITE) 540 MCG tablet Take 400 mcg by mouth daily.     HYDROcodone-acetaminophen (NORCO/VICODIN) 5-325 MG tablet Take 1 tablet by mouth every 6 (six) hours as needed for moderate pain. 30 tablet 0   ibuprofen (ADVIL) 200 MG tablet Take 600 mg by mouth as needed.     Magnesium 100 MG CAPS Take 1 capsule by mouth daily.     metoprolol succinate (TOPROL-XL) 50 MG 24 hr tablet TAKE 1 TABLET BY MOUTH EVERY DAY WITH OR IMMEDIATELY FOLLOWING A MEAL 90 tablet 3   Multiple Vitamins-Minerals (MULTIVITAMINS THER. W/MINERALS) TABS Take 1 tablet by mouth daily.     Netarsudil-Latanoprost 0.02-0.005 % SOLN Apply 1 drop to eye at bedtime.     rosuvastatin (CRESTOR) 40 MG tablet Take 1  tablet by mouth once daily 30 tablet 0   vitamin B-12 (CYANOCOBALAMIN) 100 MCG tablet Take 100 mcg by mouth daily. Unsure of strength     zinc gluconate 50 MG tablet Take 50 mg by mouth daily.     No current facility-administered medications for this visit.    Family History  Problem Relation Age of Onset   Hyperlipidemia Mother    Hypertension Mother    Hypertension Father    Glaucoma Father    Prostate cancer Father    Renal Disease Father    Macular degeneration Father    Stroke Father    Kidney disease Father    Heart disease Father    Kidney failure Father    Hypertension Sister    Hyperlipidemia Sister    Hypertension Sister    Hypertension Brother    Heart disease Brother    Hypertension Brother    Breast cancer Maternal Grandmother    Breast cancer Maternal Aunt    Diabetes Maternal Aunt    Diabetes Paternal Aunt    Breast cancer Cousin    Colon cancer Neg Hx    Colon polyps Neg Hx    Stomach cancer Neg Hx    Esophageal cancer Neg Hx     Review of Systems  All other systems reviewed and are negative.  Exam:   BP (!) 142/78    Pulse 76    Ht 5\' 4"  (1.626 m)    Wt 200 lb (90.7 kg)    LMP 09/03/2007 (Approximate)    SpO2 100%    BMI 34.33 kg/m     General appearance: alert, cooperative and appears stated age Head: normocephalic, without obvious abnormality, atraumatic Neck: no adenopathy, supple, symmetrical, trachea midline and thyroid normal to inspection and palpation Lungs: clear to auscultation bilaterally Breasts: normal appearance, no masses or tenderness, No nipple retraction or dimpling, No nipple discharge or bleeding, No axillary adenopathy Heart: regular rate and rhythm Abdomen: soft, non-tender; no masses, no organomegaly Extremities: extremities normal, atraumatic, no cyanosis or edema Skin: skin color, texture, turgor normal. No rashes or lesions Lymph nodes: cervical, supraclavicular, and axillary nodes normal. Neurologic: grossly  normal  Pelvic: External genitalia:  no lesions              No abnormal inguinal nodes palpated.  Urethra:  normal appearing urethra with no masses, tenderness or lesions              Bartholins and Skenes: normal                 Vagina: normal appearing vagina with normal color and discharge, no lesions.  Second to almost third degree cystocele, first degree uterine prolapse, and first degree rectocele.              Cervix: no lesions              Pap taken: yes Bimanual Exam:  Uterus:  normal size, contour, position, consistency, mobility, non-tender              Adnexa: no mass, fullness, tenderness              Rectal exam: yes.  Confirms.              Anus:  normal sphincter tone, no lesions  Chaperone was present for exam:  Lovena Le, CMA  Assessment:   Well woman visit with gynecologic exam. Hx LGSIL.  Hx cryotherapy.  ?Hx LEEP.  Incomplete uterovaginal prolapse.  Second degree cystocele, first degree uterine prolapse, first degree rectocele. Glaucoma. Status post right oophorectomy.  Hx endometriosis. Hx systolic murmur.  Followed by cardiology.  Bereavement.   Plan: Mammogram screening discussed. Self breast awareness reviewed. Pap and HR HPV collected today.  Guidelines for Calcium, Vitamin D, regular exercise program including cardiovascular and weight bearing exercise. Bereavement support. Follow up annually and prn.   After visit summary provided.

## 2021-10-24 ENCOUNTER — Other Ambulatory Visit: Payer: Self-pay

## 2021-10-24 ENCOUNTER — Other Ambulatory Visit (HOSPITAL_COMMUNITY)
Admission: RE | Admit: 2021-10-24 | Discharge: 2021-10-24 | Disposition: A | Discharge status: Home / Self Care | Payer: No Typology Code available for payment source | Source: Ambulatory Visit | Attending: Obstetrics and Gynecology | Admitting: Obstetrics and Gynecology

## 2021-10-24 ENCOUNTER — Encounter: Payer: Self-pay | Admitting: Obstetrics and Gynecology

## 2021-10-24 ENCOUNTER — Ambulatory Visit (INDEPENDENT_AMBULATORY_CARE_PROVIDER_SITE_OTHER): Payer: No Typology Code available for payment source | Admitting: Obstetrics and Gynecology

## 2021-10-24 VITALS — BP 142/78 | HR 76 | Ht 64.0 in | Wt 200.0 lb

## 2021-10-24 DIAGNOSIS — Z8742 Personal history of other diseases of the female genital tract: Secondary | ICD-10-CM

## 2021-10-24 DIAGNOSIS — Z01419 Encounter for gynecological examination (general) (routine) without abnormal findings: Secondary | ICD-10-CM | POA: Diagnosis not present

## 2021-10-24 NOTE — Patient Instructions (Signed)

## 2021-10-26 LAB — CYTOLOGY - PAP
Adequacy: ABSENT
Comment: NEGATIVE
Diagnosis: NEGATIVE
High risk HPV: NEGATIVE

## 2021-11-20 NOTE — Progress Notes (Addendum)
?Cardiology Office Note:   ? ?Date:  11/22/2021  ? ?ID:  [name], DOB [birth year], MRN [id number] ? ?PCP:  Ann Held, DO  ?Cardiologist:  Sinclair Grooms, MD  ? ?Referring MD: Carollee Herter, Alferd Apa, *  ? ?Chief Complaint  ?Patient presents with  ? Coronary Artery Disease  ? Cardiac Valve Problem  ?  Aortic stenosis, moderate  ? Hyperlipidemia  ? ? ?History of Present Illness:   ? ?Yvonne Sanders is a 61 y.o. female with a hx of moderate  aortic valve stenosis, essential hypertension, and LVH. ? ?In the interval, since the last office visit her father, Wilfrid Lund passed away.  He had end-stage kidney disease and CAD. ? ? ?She is still grieving with the passing of her father.  Having some sleeplessness.  She has noted that her blood pressures have been elevated when she sees her primary physicians.  They are frequently in the range of 1 20-2 60 systolic over 90 to 542 mmHg diastolic. ? ?She denies syncope, orthopnea, dizziness, and angina. ? ?Significant time talking about her high risk coronary calcium score and aggressive risk factor modification.  LDL target should be less than 55.  She is already on Repatha, Zetia, and Crestor. ? ?Natural history of aortic stenosis discussed in detail.  We will repeat an echocardiogram 12 months after the most recent study which was done in ? ?Past Medical History:  ?Diagnosis Date  ? Abnormal pap 1989  ? cryo  ? Arthritis   ? Cataract   ? Chicken pox   ? Endometriosis   ? Fibroid 1994  ? History of post uterine  ? Gallstones   ? GERD (gastroesophageal reflux disease)   ? Glaucoma   ? Headache(784.0)   ? Hyperlipidemia   ? Hypertension   ?    ? Migraine   ? Positive TB test   ? Tibia fracture   ? Right leg.    ? ? ?Past Surgical History:  ?Procedure Laterality Date  ? APPENDECTOMY  75  ? BREAST EXCISIONAL BIOPSY Right   ? CHOLECYSTECTOMY  88  ? DIAGNOSTIC LAPAROSCOPY  88  ? endometrious  ? GLAUCOMA SURGERY Left 2019  ? LUMBAR FUSION  08/2011  ? l 3-4  ?  LUMBAR FUSION    ? l 4-5  ? RIGHT OOPHORECTOMY  75  ? ? ?Current Medications: ?Current Meds  ?Medication Sig  ? ascorbic acid (VITAMIN C) 500 MG tablet Take 500 mg by mouth daily.  ? aspirin-acetaminophen-caffeine (EXCEDRIN MIGRAINE) 250-250-65 MG tablet Take 1 tablet by mouth every 6 (six) hours as needed for headache or migraine.  ? Biotin 5000 MCG CAPS Take 5,000 mcg by mouth daily.  ? Brinzolamide-Brimonidine 1-0.2 % SUSP Place 1 drop into both eyes 3 (three) times daily.   ? Calcium Carbonate Antacid (TUMS PO) Take 1 tablet by mouth daily as needed (heartburn).   ? carisoprodol (SOMA) 350 MG tablet TAKE 1 TABLET BY MOUTH 4 TIMES DAILY AS NEEDED FOR MUSCLE SPASM  ? Cholecalciferol (VITAMIN D3) 2000 units TABS Take 1 tablet by mouth daily.  ? cycloSPORINE (RESTASIS) 0.05 % ophthalmic emulsion Place 1 drop into both eyes 2 (two) times daily.  ? diclofenac Sodium (VOLTAREN) 1 % GEL Apply 4 g topically 4 (four) times daily.  ? esomeprazole (NEXIUM) 40 MG capsule Take 1 capsule by mouth once daily  ? Evolocumab (REPATHA SURECLICK) 706 MG/ML SOAJ Inject 1 mL into the skin every 14 (fourteen)  days.  ? ezetimibe (ZETIA) 10 MG tablet Take 1 tablet by mouth once daily  ? folic acid (FOLVITE) 412 MCG tablet Take 400 mcg by mouth daily.  ? HYDROcodone-acetaminophen (NORCO/VICODIN) 5-325 MG tablet Take 1 tablet by mouth every 6 (six) hours as needed for moderate pain.  ? ibuprofen (ADVIL) 200 MG tablet Take 600 mg by mouth as needed.  ? Magnesium 100 MG CAPS Take 1 capsule by mouth daily.  ? metoprolol succinate (TOPROL-XL) 50 MG 24 hr tablet TAKE 1 TABLET BY MOUTH ONCE DAILY WITH  OR  IMMEDIATELY  FOLLOWING  A  MEAL  ? Multiple Vitamins-Minerals (MULTIVITAMINS THER. W/MINERALS) TABS Take 1 tablet by mouth daily.  ? Netarsudil-Latanoprost 0.02-0.005 % SOLN Apply 1 drop to eye at bedtime.  ? rosuvastatin (CRESTOR) 40 MG tablet Take 1 tablet by mouth once daily  ? vitamin B-12 (CYANOCOBALAMIN) 100 MCG tablet Take 100 mcg by  mouth daily. Unsure of strength  ? zinc gluconate 50 MG tablet Take 50 mg by mouth daily.  ?  ? ?Allergies:   Nubain [nalbuphine hcl], Corticosteroids, Dilaudid [hydromorphone hcl], Nubain [nalbuphine hcl], Oxycodone-acetaminophen, Percodan [oxycodone-aspirin], and Tomato  ? ?Social History  ? ?Socioeconomic History  ? Marital status: Married  ?  Spouse name: Not on file  ? Number of children: 1  ? Years of education: Not on file  ? Highest education level: Not on file  ?Occupational History  ? Occupation: Surgical coodinator  ?Tobacco Use  ? Smoking status: Never  ? Smokeless tobacco: Never  ?Vaping Use  ? Vaping Use: Never used  ?Substance and Sexual Activity  ? Alcohol use: Not Currently  ? Drug use: No  ? Sexual activity: Yes  ?  Partners: Male  ?  Birth control/protection: Post-menopausal  ?Other Topics Concern  ? Not on file  ?Social History Narrative  ? Not on file  ? ?Social Determinants of Health  ? ?Financial Resource Strain: Not on file  ?Food Insecurity: Not on file  ?Transportation Needs: Not on file  ?Physical Activity: Not on file  ?Stress: Not on file  ?Social Connections: Not on file  ?  ? ?Family History: ?The patient's family history includes Breast cancer in her cousin, maternal aunt, and maternal grandmother; Diabetes in her maternal aunt and paternal aunt; Glaucoma in her father; Heart disease in her brother and father; Hyperlipidemia in her mother and sister; Hypertension in her brother, brother, father, mother, sister, and sister; Kidney disease in her father; Kidney failure in her father; Macular degeneration in her father; Prostate cancer in her father; Renal Disease in her father; Stroke in her father. There is no history of Colon cancer, Colon polyps, Stomach cancer, or Esophageal cancer. ? ?ROS:   ?Please see the history of present illness.    ?Back and musculoskeletal pain limits activity. All other systems reviewed and are negative. ? ?EKGs/Labs/Other Studies Reviewed:   ? ?The  following studies were reviewed today:\\ ?Laboratory data: ?Hemoglobin A1c 5.1, September 30, 2020 ?LDL cholesterol 51, January 13th, 2023 ? ?Coronary artery calcium score February 2022: ?IMPRESSION: ?1. Coronary calcium score of 597. This was 80 percentile for age and ?sex matched control. ?  ?2. Severe calcifications of the aortic valve (calcium score 3654) ?and of the aortic root. Consider an echocardiogram for evaluation of ?severity of aortic stenosis. ? ?2 D Doppler ECHOCARDIOGRAM 2022: ?IMPRESSIONS  ? 1. Left ventricular ejection fraction, by estimation, is >75%. The left  ?ventricle has hyperdynamic function. The left ventricle has no regional  ?  wall motion abnormalities. There is mild left ventricular hypertrophy.  ?Left ventricular diastolic parameters  ?were normal.  ? 2. Right ventricular systolic function is normal. The right ventricular  ?size is normal.  ? 3. The mitral valve is normal in structure. Trivial mitral valve  ?regurgitation. No evidence of mitral stenosis.  ? 4. The aortic valve has an indeterminant number of cusps. Aortic valve  ?regurgitation is trivial. Moderate aortic valve stenosis.  Aortic valve peak velocity 3.07 m/s. ? 5. The inferior vena cava is normal in size with greater than 50%  ?respiratory variability, suggesting right atrial pressure of 3 mmHg.  ? ?EKG:  EKG normal sinus rhythm with normal EKG appearance. ? ?Recent Labs: ?03/30/2021: Hemoglobin 12.4; Platelets 170.0; TSH 0.98 ?10/02/2021: ALT 30; BUN 10; Creatinine, Ser 0.66; Potassium 4.2; Sodium 141  ?Recent Lipid Panel ?   ?Component Value Date/Time  ? CHOL 137 10/02/2021 0901  ? TRIG 75.0 10/02/2021 0901  ? HDL 70.20 10/02/2021 0901  ? CHOLHDL 2 10/02/2021 0901  ? VLDL 15.0 10/02/2021 0901  ? LDLCALC 51 10/02/2021 0901  ? ? ?Physical Exam:   ? ?VS:  BP (!) 160/94   Pulse (!) 59   Ht '5\' 4"'$  (1.626 m)   Wt 205 lb 3.2 oz (93.1 kg)   LMP 09/03/2007 (Approximate)   SpO2 96%   BMI 35.22 kg/m?    ? ?Wt Readings from Last 3  Encounters:  ?11/22/21 205 lb 3.2 oz (93.1 kg)  ?10/24/21 200 lb (90.7 kg)  ?10/02/21 202 lb 6.4 oz (91.8 kg)  ?  ? ?GEN: Overweight/morbid. No acute distress ?HEENT: Normal ?NECK: No JVD. ?LYMPHATICS: No lymphadenopat

## 2021-11-21 ENCOUNTER — Other Ambulatory Visit: Payer: Self-pay | Admitting: Family Medicine

## 2021-11-21 ENCOUNTER — Other Ambulatory Visit: Payer: Self-pay | Admitting: Interventional Cardiology

## 2021-11-21 DIAGNOSIS — E785 Hyperlipidemia, unspecified: Secondary | ICD-10-CM

## 2021-11-21 DIAGNOSIS — I10 Essential (primary) hypertension: Secondary | ICD-10-CM

## 2021-11-22 ENCOUNTER — Encounter: Payer: Self-pay | Admitting: Interventional Cardiology

## 2021-11-22 ENCOUNTER — Ambulatory Visit (INDEPENDENT_AMBULATORY_CARE_PROVIDER_SITE_OTHER): Payer: No Typology Code available for payment source | Admitting: Interventional Cardiology

## 2021-11-22 ENCOUNTER — Other Ambulatory Visit: Payer: Self-pay

## 2021-11-22 VITALS — BP 160/94 | HR 59 | Ht 64.0 in | Wt 205.2 lb

## 2021-11-22 DIAGNOSIS — E7849 Other hyperlipidemia: Secondary | ICD-10-CM

## 2021-11-22 DIAGNOSIS — I10 Essential (primary) hypertension: Secondary | ICD-10-CM | POA: Diagnosis not present

## 2021-11-22 DIAGNOSIS — I2510 Atherosclerotic heart disease of native coronary artery without angina pectoris: Secondary | ICD-10-CM | POA: Diagnosis not present

## 2021-11-22 DIAGNOSIS — I350 Nonrheumatic aortic (valve) stenosis: Secondary | ICD-10-CM

## 2021-11-22 MED ORDER — LOSARTAN POTASSIUM-HCTZ 50-12.5 MG PO TABS: 1.0000 | ORAL_TABLET | Freq: Every day | ORAL | 3 refills | Status: DC

## 2021-11-22 NOTE — Patient Instructions (Signed)
Medication Instructions:  ?1) START Losartan/HCTZ 50/12.'5mg'$  once daily. ? ?*If you need a refill on your cardiac medications before your next appointment, please call your pharmacy* ? ? ?Lab Work: ?BMET when you come to see the Hypertension Clinic ? ?If you have labs (blood work) drawn today and your tests are completely normal, you will receive your results only by: ?MyChart Message (if you have MyChart) OR ?A paper copy in the mail ?If you have any lab test that is abnormal or we need to change your treatment, we will call you to review the results. ? ? ?Testing/Procedures: ?Your physician has requested that you have an echocardiogram anytime after April 8th. Echocardiography is a painless test that uses sound waves to create images of your heart. It provides your doctor with information about the size and shape of your heart and how well your heart?s chambers and valves are working. This procedure takes approximately one hour. There are no restrictions for this procedure. ? ? ? ?Follow-Up: ? ?Your physician recommends that you schedule a follow-up appointment in 2-4 weeks with the Hypertension Clinic. ? ? ?At [facility], you and your health needs are our priority.  As part of our continuing mission to provide you with exceptional heart care, we have created designated Provider Care Teams.  These Care Teams include your primary Cardiologist (physician) and Advanced Practice Providers (APPs -  Physician Assistants and Nurse Practitioners) who all work together to provide you with the care you need, when you need it. ? ?We recommend signing up for the patient portal called "MyChart".  Sign up information is provided on this After Visit Summary.  MyChart is used to connect with patients for Virtual Visits (Telemedicine).  Patients are able to view lab/test results, encounter notes, upcoming appointments, etc.  Non-urgent messages can be sent to your provider as well.   ?To learn more about what you can do with  MyChart, go to [email or web address].   ? ?Your next appointment:   ?6-9 month(s) ? ?The format for your next appointment:   ?In Person ? ?Provider:   ?Sinclair Grooms, MD  ? ? ?Other Instructions ?  ?

## 2021-11-23 ENCOUNTER — Ambulatory Visit: Payer: No Typology Code available for payment source | Admitting: Interventional Cardiology

## 2021-12-05 ENCOUNTER — Other Ambulatory Visit: Payer: Self-pay | Admitting: Interventional Cardiology

## 2021-12-05 DIAGNOSIS — E7849 Other hyperlipidemia: Secondary | ICD-10-CM

## 2021-12-19 NOTE — Progress Notes (Signed)
Patient ID: Yvonne Sanders                 DOB: 1961/05/27                      MRN: [id number] ? ? ? ?HPI: ?Yvonne Sanders is a 61 y.o. female referred by Dr. Tamala Julian to HTN clinic. PMH is significant for moderate aortic valve stenosis, HTN, HLD, and LVH. Pt had coronary CTA done which showed calcium score of 597 (99th percentile for age, sex, and race) and severe calcifications of the aortic valve and aortic root. LDL is well-controlled at 51 on rosuvastatin 40 mg daily, ezetimibe 10 mg daily, and Repatha. Previously followed by PharmD at lipid clinic. Pt saw Dr. Tamala Julian on 11/22/21 for f/u and her BP was elevated to 160/94. Pt reported BP has been running high at her PCP visits (150-160s/90-100). She was started on losartan-HCTZ 50-12.5 mg once daily. ? ?Pt presents today for HTN clinic appointment as well as BMET and echo. She reports med compliance and did take her meds this morning prior to visit. Denies dizziness and lightheadedness. Has occasional blurred vision and headaches at baseline but neither have worsened since starting losartan-HCTZ. Does not check BP at home since she has a manual cuff which is hard to check by herself. May be able to have BP checked at work. ?  ?Current HTN meds: metoprolol succinate 50 mg once daily, losartan-HCTZ 50-12.5 mg once daily ? ?Previously tried: spironolactone (originally used for swelling, but d/c on 04/11/2016 by PCP) ? ?BP goal: <130/45mHg ? ?Family History: Heart disease (brother, father), HLD (mother, sister), HTN (brothers, sisters, mother, father), stroke (father) ? ?Social History: Denies tobacco, illicit drug, and alcohol use ? ?Diet: Does not eat a lot throughout the day. Breakfast - scoop of peanut butter or crackers. Lunch/Dinner - protein shake, baked chicken. Cooks a lot in her air fryer. Does not eat a lot of frozen meals, processed foods, or fast food. Does not add extra salt when cooking. ?Drinks a lot of water and limits caffeine. Drinks 1 to 1.5 cups of  coffee every day. ? ?Exercise: Walks sometimes and does yard work. She is not as active lately d/t back pain and leg numbness, but trying to increase activity. ? ?Wt Readings from Last 3 Encounters:  ?11/22/21 205 lb 3.2 oz (93.1 kg)  ?10/24/21 200 lb (90.7 kg)  ?10/02/21 202 lb 6.4 oz (91.8 kg)  ? ?BP Readings from Last 3 Encounters:  ?11/22/21 (!) 160/94  ?10/24/21 (!) 142/78  ?10/02/21 140/80  ? ?Pulse Readings from Last 3 Encounters:  ?11/22/21 (!) 59  ?10/24/21 76  ?10/02/21 63  ? ? ?Renal function: ?CrCl cannot be calculated (Patient's most recent lab result is older than the maximum 21 days allowed.). ? ?Past Medical History:  ?Diagnosis Date  ? Abnormal pap 1989  ? cryo  ? Arthritis   ? Cataract   ? Chicken pox   ? Endometriosis   ? Fibroid 1994  ? History of post uterine  ? Gallstones   ? GERD (gastroesophageal reflux disease)   ? Glaucoma   ? Headache(784.0)   ? Hyperlipidemia   ? Hypertension   ?    ? Migraine   ? Positive TB test   ? Tibia fracture   ? Right leg.    ? ? ?Current Outpatient Medications on File Prior to Visit  ?Medication Sig Dispense Refill  ? ascorbic acid (VITAMIN C) 500 MG  tablet Take 500 mg by mouth daily.    ? aspirin-acetaminophen-caffeine (EXCEDRIN MIGRAINE) 250-250-65 MG tablet Take 1 tablet by mouth every 6 (six) hours as needed for headache or migraine.    ? Biotin 5000 MCG CAPS Take 5,000 mcg by mouth daily.    ? Brinzolamide-Brimonidine 1-0.2 % SUSP Place 1 drop into both eyes 3 (three) times daily.     ? Calcium Carbonate Antacid (TUMS PO) Take 1 tablet by mouth daily as needed (heartburn).     ? carisoprodol (SOMA) 350 MG tablet TAKE 1 TABLET BY MOUTH 4 TIMES DAILY AS NEEDED FOR MUSCLE SPASM 60 tablet 0  ? Cholecalciferol (VITAMIN D3) 2000 units TABS Take 1 tablet by mouth daily.    ? cycloSPORINE (RESTASIS) 0.05 % ophthalmic emulsion Place 1 drop into both eyes 2 (two) times daily.    ? diclofenac Sodium (VOLTAREN) 1 % GEL Apply 4 g topically 4 (four) times daily. 350 g 2   ? esomeprazole (NEXIUM) 40 MG capsule Take 1 capsule by mouth once daily 90 capsule 1  ? Evolocumab (REPATHA SURECLICK) 314 MG/ML SOAJ INJECT 1 ML INTO THE SKIN EVERY 14 DAYS 2 mL 11  ? ezetimibe (ZETIA) 10 MG tablet Take 1 tablet by mouth once daily 90 tablet 1  ? folic acid (FOLVITE) 970 MCG tablet Take 400 mcg by mouth daily.    ? HYDROcodone-acetaminophen (NORCO/VICODIN) 5-325 MG tablet Take 1 tablet by mouth every 6 (six) hours as needed for moderate pain. 30 tablet 0  ? ibuprofen (ADVIL) 200 MG tablet Take 600 mg by mouth as needed.    ? losartan-hydrochlorothiazide (HYZAAR) 50-12.5 MG tablet Take 1 tablet by mouth daily. 90 tablet 3  ? Magnesium 100 MG CAPS Take 1 capsule by mouth daily.    ? metoprolol succinate (TOPROL-XL) 50 MG 24 hr tablet TAKE 1 TABLET BY MOUTH ONCE DAILY WITH  OR  IMMEDIATELY  FOLLOWING  A  MEAL 30 tablet 0  ? Multiple Vitamins-Minerals (MULTIVITAMINS THER. W/MINERALS) TABS Take 1 tablet by mouth daily.    ? Netarsudil-Latanoprost 0.02-0.005 % SOLN Apply 1 drop to eye at bedtime.    ? rosuvastatin (CRESTOR) 40 MG tablet Take 1 tablet by mouth once daily 90 tablet 1  ? vitamin B-12 (CYANOCOBALAMIN) 100 MCG tablet Take 100 mcg by mouth daily. Unsure of strength    ? zinc gluconate 50 MG tablet Take 50 mg by mouth daily.    ? ?No current facility-administered medications on file prior to visit.  ? ?Allergies  ?Allergen Reactions  ? Nubain [Nalbuphine Hcl] Anaphylaxis  ? Corticosteroids Other (See Comments)  ?  Due to glaucoma can not have steroids  ? Dilaudid [Hydromorphone Hcl] Nausea And Vomiting  ?  Severe itching  ? Nubain [Nalbuphine Hcl] Nausea And Vomiting  ? Oxycodone-Acetaminophen Itching  ? Percodan [Oxycodone-Aspirin] Hives and Itching  ? Tomato Rash  ?  Rash, spots on tongue  ? ?Assessment/Plan: ? ?1. Hypertension - BP 154/82 above goal <130/55mHg. Checking BMET today and will plan to increase losartan-HCTZ dose if stable. Otherwise, pt will continue on metoprolol succinate  '50mg'$  daily and current dose of losartan-HCTZ 50-12.'5mg'$  daily until she hears from office with lab results. Encouraged pt to increase activity up to 150 mins per week. Provided pt with link to [email or web address] look for manual cuffs to start checking at home or can check BP at work. Will schedule follow-up once labs result. ? ?Patient seen with SDebria Garret PLower Burrellpharmacy student ? ?Megan E.  Supple, PharmD, BCACP, CPP ?[place], [place], [place] [address] ?Phone: [phone]; Fax: [phone] ?12/20/2021 3:22 PM ? ? ?

## 2021-12-20 ENCOUNTER — Ambulatory Visit (HOSPITAL_COMMUNITY): Payer: No Typology Code available for payment source | Attending: Cardiology

## 2021-12-20 ENCOUNTER — Ambulatory Visit (INDEPENDENT_AMBULATORY_CARE_PROVIDER_SITE_OTHER): Payer: No Typology Code available for payment source | Admitting: Pharmacist

## 2021-12-20 ENCOUNTER — Other Ambulatory Visit: Payer: No Typology Code available for payment source

## 2021-12-20 VITALS — BP 154/82 | HR 54

## 2021-12-20 DIAGNOSIS — I10 Essential (primary) hypertension: Secondary | ICD-10-CM | POA: Diagnosis not present

## 2021-12-20 DIAGNOSIS — E7849 Other hyperlipidemia: Secondary | ICD-10-CM

## 2021-12-20 DIAGNOSIS — I350 Nonrheumatic aortic (valve) stenosis: Secondary | ICD-10-CM | POA: Insufficient documentation

## 2021-12-20 DIAGNOSIS — I2510 Atherosclerotic heart disease of native coronary artery without angina pectoris: Secondary | ICD-10-CM

## 2021-12-20 LAB — ECHOCARDIOGRAM COMPLETE
AR max vel: 1.07 cm2
AV Area VTI: 1.08 cm2
AV Area mean vel: 1.05 cm2
AV Mean grad: 19 mmHg
AV Peak grad: 37.7 mmHg
Ao pk vel: 3.07 m/s
Area-P 1/2: 3.76 cm2
S' Lateral: 2.3 cm

## 2021-12-20 NOTE — Patient Instructions (Addendum)
Your blood pressure goal is <130/80 ? ?[email or web address]  ? ?Will call if labs are normal and make any medication changes.  ?Continue current meds. ? ?Try to increase activity up to 150 mins per week ? ?

## 2021-12-21 ENCOUNTER — Telehealth: Payer: Self-pay | Admitting: Pharmacist

## 2021-12-21 LAB — BASIC METABOLIC PANEL
BUN/Creatinine Ratio: 14 (ref 12–28)
BUN: 10 mg/dL (ref 8–27)
CO2: 24 mmol/L (ref 20–29)
Calcium: 9.5 mg/dL (ref 8.7–10.3)
Chloride: 106 mmol/L (ref 96–106)
Creatinine, Ser: 0.71 mg/dL (ref 0.57–1.00)
Glucose: 91 mg/dL (ref 70–99)
Potassium: 4.3 mmol/L (ref 3.5–5.2)
Sodium: 146 mmol/L — ABNORMAL HIGH (ref 134–144)
eGFR: 97 mL/min/{1.73_m2} (ref >59)

## 2021-12-21 LAB — LIPOPROTEIN A (LPA): Lipoprotein (a): 216.9 nmol/L — ABNORMAL HIGH (ref <75.0)

## 2021-12-21 MED ORDER — LOSARTAN POTASSIUM-HCTZ 100-25 MG PO TABS: 1.0000 | ORAL_TABLET | Freq: Every day | ORAL | 3 refills | Status: DC

## 2021-12-21 NOTE — Telephone Encounter (Signed)
Left message for pt. ? ?BMET stable. Will increase losartan-HCTZ 50-12.'5mg'$  to 100-'25mg'$  daily for better BP control. Will need f/u scheduled with PharmD for BP check and BMET in 3-4 weeks. ?

## 2021-12-25 ENCOUNTER — Telehealth: Payer: Self-pay | Admitting: Interventional Cardiology

## 2021-12-25 NOTE — Telephone Encounter (Addendum)
2nd message left for pt yesterday and 3rd message left today. ?

## 2021-12-25 NOTE — Telephone Encounter (Signed)
Belva Crome, MD  ?12/21/2021  7:54 AM EDT   ?  ?Let the patient know the aortic stenosis is moderate without significant change compared to 1 year ago. Repeat echo in 1.5 years or earlier if symptoms.  ? ? ? Belva Crome, MD  ?12/21/2021  7:57 AM EDT   ?  ?Let the patient know there is severe elevation in LPa. There is association between LPa elevation and progression of AoS. Needs to keep this in mind. Therapies to lower LPa are being done, but not currently available for commercial use. We will monitor for progress.  ? ?Called patient back with results of echo and labs as written above. Patient verbalized understanding. ?

## 2021-12-25 NOTE — Telephone Encounter (Signed)
Patient is returning RN's call for labs and Echo.  ?

## 2021-12-26 NOTE — Telephone Encounter (Signed)
4th voicemail left for pt. ?

## 2021-12-27 NOTE — Telephone Encounter (Signed)
5th attempt to reach pt. Pt answered this time. States she started higher dose of losartan-HCTZ 1 week ago. States she is unable to schedule follow up appt currently as she needs to be at work looking at her calendar. Advised her to either call back my direct line to schedule or the scheduling team. She verbalized understanding. ?

## 2022-01-31 ENCOUNTER — Ambulatory Visit (INDEPENDENT_AMBULATORY_CARE_PROVIDER_SITE_OTHER): Payer: No Typology Code available for payment source | Admitting: Pharmacist

## 2022-01-31 VITALS — BP 118/72 | HR 63

## 2022-01-31 DIAGNOSIS — I10 Essential (primary) hypertension: Secondary | ICD-10-CM | POA: Diagnosis not present

## 2022-01-31 NOTE — Progress Notes (Signed)
Patient ID: Yvonne Sanders                 DOB: Feb 17, 1961                      MRN: [id number]    HPI: Yvonne Sanders is a 61 y.o. female referred by Dr. Tamala Julian to HTN clinic. PMH is significant for moderate aortic valve stenosis, HTN, HLD, and LVH. Pt had coronary CTA done which showed calcium score of 597 (99th percentile for age, sex, and race) and severe calcifications of the aortic valve and aortic root. LDL is well-controlled at 51 on rosuvastatin 40 mg daily, ezetimibe 10 mg daily, and Repatha. Previously followed by PharmD at lipid clinic. Pt saw Dr. Tamala Julian on 11/22/21 for f/u and her BP was elevated to 160/94. Pt reported BP has been running high at her PCP visits (150-160s/90-100). She was started on losartan-HCTZ 50-12.5 mg once daily. F/u BP remained elevated at 154/82, BMET was stable, and losartan-HCTZ was further increased to 100-'25mg'$  daily.  Pt presents today for HTN clinic appointment as well as BMET. She reports med compliance and did take her meds this morning prior to visit. Denies dizziness and lightheadedness. Has occasional blurred vision and headaches at baseline but no changes out of the ordinary. Does not check BP at home. Overall has more energy and has been working in her yard more. Started taking a CoQ10 supplement as she was noticing some cramping on her Repatha. Symptoms improved with CoQ10.   Current HTN meds: metoprolol succinate 50 mg once daily (7am), losartan-HCTZ 100-25 mg once daily (7am)  Previously tried: spironolactone (originally used for swelling, but d/c on 04/11/2016 by PCP)  BP goal: <130/50mHg  Family History: Heart disease (brother, father), HLD (mother, sister), HTN (brothers, sisters, mother, father), stroke (father)  Social History: Denies tobacco, illicit drug, and alcohol use  Diet: Does not eat a lot throughout the day. Breakfast - scoop of peanut butter or crackers. Lunch/Dinner - protein shake, baked chicken. Cooks a lot in her air fryer.  Does not eat a lot of frozen meals, processed foods, or fast food. Does not add extra salt when cooking. Drinks a lot of water and limits caffeine. Drinks 1 to 1.5 cups of coffee every day.  Exercise: More yard work recently.  Wt Readings from Last 3 Encounters:  11/22/21 205 lb 3.2 oz (93.1 kg)  10/24/21 200 lb (90.7 kg)  10/02/21 202 lb 6.4 oz (91.8 kg)   BP Readings from Last 3 Encounters:  12/20/21 (!) 154/82  11/22/21 (!) 160/94  10/24/21 (!) 142/78   Pulse Readings from Last 3 Encounters:  12/20/21 (!) 54  11/22/21 (!) 59  10/24/21 76    Renal function: CrCl cannot be calculated (Patient's most recent lab result is older than the maximum 21 days allowed.).  Past Medical History:  Diagnosis Date   Abnormal pap 1989   cryo   Arthritis    Cataract    Chicken pox    Endometriosis    Fibroid 1994   History of post uterine   Gallstones    GERD (gastroesophageal reflux disease)    Glaucoma    Headache(784.0)    Hyperlipidemia    Hypertension        Migraine    Positive TB test    Tibia fracture    Right leg.      Current Outpatient Medications on File Prior to Visit  Medication Sig Dispense Refill  ascorbic acid (VITAMIN C) 500 MG tablet Take 500 mg by mouth daily.     aspirin-acetaminophen-caffeine (EXCEDRIN MIGRAINE) 250-250-65 MG tablet Take 1 tablet by mouth every 6 (six) hours as needed for headache or migraine.     Biotin 5000 MCG CAPS Take 5,000 mcg by mouth daily.     Brinzolamide-Brimonidine 1-0.2 % SUSP Place 1 drop into both eyes 3 (three) times daily.      Calcium Carbonate Antacid (TUMS PO) Take 1 tablet by mouth daily as needed (heartburn).      carisoprodol (SOMA) 350 MG tablet TAKE 1 TABLET BY MOUTH 4 TIMES DAILY AS NEEDED FOR MUSCLE SPASM 60 tablet 0   Cholecalciferol (VITAMIN D3) 2000 units TABS Take 1 tablet by mouth daily.     cycloSPORINE (RESTASIS) 0.05 % ophthalmic emulsion Place 1 drop into both eyes 2 (two) times daily.     diclofenac  Sodium (VOLTAREN) 1 % GEL Apply 4 g topically 4 (four) times daily. 350 g 2   esomeprazole (NEXIUM) 40 MG capsule Take 1 capsule by mouth once daily 90 capsule 1   Evolocumab (REPATHA SURECLICK) 233 MG/ML SOAJ INJECT 1 ML INTO THE SKIN EVERY 14 DAYS 2 mL 11   ezetimibe (ZETIA) 10 MG tablet Take 1 tablet by mouth once daily 90 tablet 1   folic acid (FOLVITE) 007 MCG tablet Take 400 mcg by mouth daily.     HYDROcodone-acetaminophen (NORCO/VICODIN) 5-325 MG tablet Take 1 tablet by mouth every 6 (six) hours as needed for moderate pain. 30 tablet 0   ibuprofen (ADVIL) 200 MG tablet Take 600 mg by mouth as needed.     losartan-hydrochlorothiazide (HYZAAR) 100-25 MG tablet Take 1 tablet by mouth daily. 90 tablet 3   Magnesium 100 MG CAPS Take 1 capsule by mouth daily.     metoprolol succinate (TOPROL-XL) 50 MG 24 hr tablet TAKE 1 TABLET BY MOUTH ONCE DAILY WITH  OR  IMMEDIATELY  FOLLOWING  A  MEAL 30 tablet 0   Multiple Vitamins-Minerals (MULTIVITAMINS THER. W/MINERALS) TABS Take 1 tablet by mouth daily.     Netarsudil-Latanoprost 0.02-0.005 % SOLN Apply 1 drop to eye at bedtime.     rosuvastatin (CRESTOR) 40 MG tablet Take 1 tablet by mouth once daily 90 tablet 1   vitamin B-12 (CYANOCOBALAMIN) 100 MCG tablet Take 100 mcg by mouth daily. Unsure of strength     zinc gluconate 50 MG tablet Take 50 mg by mouth daily.     No current facility-administered medications on file prior to visit.   Allergies  Allergen Reactions   Nubain [Nalbuphine Hcl] Anaphylaxis   Corticosteroids Other (See Comments)    Due to glaucoma can not have steroids   Dilaudid [Hydromorphone Hcl] Nausea And Vomiting    Severe itching   Nubain [Nalbuphine Hcl] Nausea And Vomiting   Oxycodone-Acetaminophen Itching   Percodan [Oxycodone-Aspirin] Hives and Itching   Tomato Rash    Rash, spots on tongue   Assessment/Plan:  1. Hypertension - BP has improved and is now at goal <130/59mHg. Checking BMET today given recent dose  increase of losartan-HCTZ. Will continue current meds including losartan-HCTZ 100-'25mg'$  daily and Toprol '50mg'$  daily. She'll continue working to increase physical activity. F/u with HTN clinic as needed.  Othelia Riederer E. Sewell Pitner, PharmD, BCACP, [id number]. [address] [place] [address]Phone: [phone] Fax: [phone]/09/2021 9:41 AM

## 2022-01-31 NOTE — Patient Instructions (Signed)
It was nice to see you today!  Your blood pressure is excellent and at goal < 130/69mHg  Continue taking your current medications and let uKoreaknow if you have any concerns

## 2022-02-01 LAB — BASIC METABOLIC PANEL
BUN/Creatinine Ratio: 18 (ref 12–28)
BUN: 12 mg/dL (ref 8–27)
CO2: 25 mmol/L (ref 20–29)
Calcium: 9.9 mg/dL (ref 8.7–10.3)
Chloride: 101 mmol/L (ref 96–106)
Creatinine, Ser: 0.68 mg/dL (ref 0.57–1.00)
Glucose: 83 mg/dL (ref 70–99)
Potassium: 4.2 mmol/L (ref 3.5–5.2)
Sodium: 143 mmol/L (ref 134–144)
eGFR: 100 mL/min/{1.73_m2} (ref >59)

## 2022-02-16 ENCOUNTER — Other Ambulatory Visit: Payer: Self-pay | Admitting: Family Medicine

## 2022-02-16 ENCOUNTER — Other Ambulatory Visit: Payer: Self-pay | Admitting: Interventional Cardiology

## 2022-02-16 DIAGNOSIS — I10 Essential (primary) hypertension: Secondary | ICD-10-CM

## 2022-02-16 DIAGNOSIS — G8929 Other chronic pain: Secondary | ICD-10-CM

## 2022-03-14 ENCOUNTER — Other Ambulatory Visit: Payer: Self-pay | Admitting: Family Medicine

## 2022-03-14 DIAGNOSIS — M48061 Spinal stenosis, lumbar region without neurogenic claudication: Secondary | ICD-10-CM

## 2022-04-02 ENCOUNTER — Telehealth: Payer: Self-pay | Admitting: Family Medicine

## 2022-04-02 ENCOUNTER — Ambulatory Visit (INDEPENDENT_AMBULATORY_CARE_PROVIDER_SITE_OTHER): Payer: No Typology Code available for payment source | Admitting: Family Medicine

## 2022-04-02 ENCOUNTER — Other Ambulatory Visit: Payer: Self-pay | Admitting: Family Medicine

## 2022-04-02 ENCOUNTER — Ambulatory Visit (HOSPITAL_BASED_OUTPATIENT_CLINIC_OR_DEPARTMENT_OTHER)
Admission: RE | Admit: 2022-04-02 | Discharge: 2022-04-02 | Disposition: A | Discharge status: Home / Self Care | Payer: No Typology Code available for payment source | Source: Ambulatory Visit | Attending: Family Medicine | Admitting: Family Medicine

## 2022-04-02 ENCOUNTER — Encounter: Payer: Self-pay | Admitting: Family Medicine

## 2022-04-02 VITALS — BP 110/78 | HR 70 | Temp 97.8°F | Resp 18 | Ht 64.0 in | Wt 209.8 lb

## 2022-04-02 DIAGNOSIS — M25561 Pain in right knee: Secondary | ICD-10-CM

## 2022-04-02 DIAGNOSIS — I10 Essential (primary) hypertension: Secondary | ICD-10-CM

## 2022-04-02 DIAGNOSIS — Z79899 Other long term (current) drug therapy: Secondary | ICD-10-CM

## 2022-04-02 DIAGNOSIS — G894 Chronic pain syndrome: Secondary | ICD-10-CM

## 2022-04-02 DIAGNOSIS — Z0000 Encounter for general adult medical examination without abnormal findings: Secondary | ICD-10-CM | POA: Diagnosis not present

## 2022-04-02 DIAGNOSIS — R011 Cardiac murmur, unspecified: Secondary | ICD-10-CM

## 2022-04-02 DIAGNOSIS — H401190 Primary open-angle glaucoma, unspecified eye, stage unspecified: Secondary | ICD-10-CM

## 2022-04-02 DIAGNOSIS — E785 Hyperlipidemia, unspecified: Secondary | ICD-10-CM | POA: Diagnosis not present

## 2022-04-02 DIAGNOSIS — M48061 Spinal stenosis, lumbar region without neurogenic claudication: Secondary | ICD-10-CM

## 2022-04-02 LAB — TSH: TSH: 0.74 u[IU]/mL (ref 0.35–5.50)

## 2022-04-02 LAB — CBC WITH DIFFERENTIAL/PLATELET
Basophils Absolute: 0 10*3/uL (ref 0.0–0.1)
Basophils Relative: 0.5 % (ref 0.0–3.0)
Eosinophils Absolute: 0 10*3/uL (ref 0.0–0.7)
Eosinophils Relative: 0.8 % (ref 0.0–5.0)
HCT: 36.5 % (ref 36.0–46.0)
Hemoglobin: 11.6 g/dL — ABNORMAL LOW (ref 12.0–15.0)
Lymphocytes Relative: 34 % (ref 12.0–46.0)
Lymphs Abs: 1.8 10*3/uL (ref 0.7–4.0)
MCHC: 31.7 g/dL (ref 30.0–36.0)
MCV: 85.5 fl (ref 78.0–100.0)
Monocytes Absolute: 0.3 10*3/uL (ref 0.1–1.0)
Monocytes Relative: 6.6 % (ref 3.0–12.0)
Neutro Abs: 3.1 10*3/uL (ref 1.4–7.7)
Neutrophils Relative %: 58.1 % (ref 43.0–77.0)
Platelets: 191 10*3/uL (ref 150.0–400.0)
RBC: 4.27 Mil/uL (ref 3.87–5.11)
RDW: 12.9 % (ref 11.5–15.5)
WBC: 5.3 10*3/uL (ref 4.0–10.5)

## 2022-04-02 LAB — LIPID PANEL
Cholesterol: 131 mg/dL (ref 0–200)
HDL: 70.3 mg/dL (ref >39.00)
LDL Cholesterol: 49 mg/dL (ref 0–99)
NonHDL: 60.34
Total CHOL/HDL Ratio: 2
Triglycerides: 57 mg/dL (ref 0.0–149.0)
VLDL: 11.4 mg/dL (ref 0.0–40.0)

## 2022-04-02 LAB — COMPREHENSIVE METABOLIC PANEL
ALT: 47 U/L — ABNORMAL HIGH (ref 0–35)
AST: 35 U/L (ref 0–37)
Albumin: 4.4 g/dL (ref 3.5–5.2)
Alkaline Phosphatase: 72 U/L (ref 39–117)
BUN: 12 mg/dL (ref 6–23)
CO2: 27 mEq/L (ref 19–32)
Calcium: 9.4 mg/dL (ref 8.4–10.5)
Chloride: 103 mEq/L (ref 96–112)
Creatinine, Ser: 0.78 mg/dL (ref 0.40–1.20)
GFR: 82.3 mL/min (ref >60.00)
Glucose, Bld: 97 mg/dL (ref 70–99)
Potassium: 3.6 mEq/L (ref 3.5–5.1)
Sodium: 137 mEq/L (ref 135–145)
Total Bilirubin: 0.7 mg/dL (ref 0.2–1.2)
Total Protein: 7.3 g/dL (ref 6.0–8.3)

## 2022-04-02 MED ORDER — HYDROCODONE-ACETAMINOPHEN 5-325 MG PO TABS: 1.0000 | ORAL_TABLET | Freq: Four times a day (QID) | ORAL | 0 refills | Status: DC | PRN

## 2022-04-02 NOTE — Assessment & Plan Note (Signed)
Per neurosurgeon

## 2022-04-02 NOTE — Assessment & Plan Note (Signed)
Per opth 

## 2022-04-02 NOTE — Assessment & Plan Note (Signed)
Encourage heart healthy diet such as MIND or DASH diet, increase exercise, avoid trans fats, simple carbohydrates and processed foods, consider a krill or fish or flaxseed oil cap daily.  °

## 2022-04-02 NOTE — Assessment & Plan Note (Signed)
Well controlled, no changes to meds. Encouraged heart healthy diet such as the DASH diet and exercise as tolerated.  °

## 2022-04-02 NOTE — Patient Instructions (Signed)
Preventive Care 61-61 Years Old, Female Preventive care refers to lifestyle choices and visits with your health care provider that can promote health and wellness. Preventive care visits are also called wellness exams. What can I expect for my preventive care visit? Counseling Your health care provider may ask you questions about your: Medical history, including: Past medical problems. Family medical history. Pregnancy history. Current health, including: Menstrual cycle. Method of birth control. Emotional well-being. Home life and relationship well-being. Sexual activity and sexual health. Lifestyle, including: Alcohol, nicotine or tobacco, and drug use. Access to firearms. Diet, exercise, and sleep habits. Work and work environment. Sunscreen use. Safety issues such as seatbelt and bike helmet use. Physical exam Your health care provider will check your: Height and weight. These may be used to calculate your BMI (body mass index). BMI is a measurement that tells if you are at a healthy weight. Waist circumference. This measures the distance around your waistline. This measurement also tells if you are at a healthy weight and may help predict your risk of certain diseases, such as type 2 diabetes and high blood pressure. Heart rate and blood pressure. Body temperature. Skin for abnormal spots. What immunizations do I need?  Vaccines are usually given at various ages, according to a schedule. Your health care provider will recommend vaccines for you based on your age, medical history, and lifestyle or other factors, such as travel or where you work. What tests do I need? Screening Your health care provider may recommend screening tests for certain conditions. This may include: Lipid and cholesterol levels. Diabetes screening. This is done by checking your blood sugar (glucose) after you have not eaten for a while (fasting). Pelvic exam and Pap test. Hepatitis B test. Hepatitis C  test. HIV (human immunodeficiency virus) test. STI (sexually transmitted infection) testing, if you are at risk. Lung cancer screening. Colorectal cancer screening. Mammogram. Talk with your health care provider about when you should start having regular mammograms. This may depend on whether you have a family history of breast cancer. BRCA-related cancer screening. This may be done if you have a family history of breast, ovarian, tubal, or peritoneal cancers. Bone density scan. This is done to screen for osteoporosis. Talk with your health care provider about your test results, treatment options, and if necessary, the need for more tests. Follow these instructions at home: Eating and drinking  Eat a diet that includes fresh fruits and vegetables, whole grains, lean protein, and low-fat dairy products. Take vitamin and mineral supplements as recommended by your health care provider. Do not drink alcohol if: Your health care provider tells you not to drink. You are pregnant, may be pregnant, or are planning to become pregnant. If you drink alcohol: Limit how much you have to 0-1 drink a day. Know how much alcohol is in your drink. In the [place] one drink equals one 12 oz bottle of beer (355 mL), one 5 oz glass of wine (148 mL), or one 1 oz glass of hard liquor (44 mL). Lifestyle Brush your teeth every morning and night with fluoride toothpaste. Floss one time each day. Exercise for at least 30 minutes 5 or more days each week. Do not use any products that contain nicotine or tobacco. These products include cigarettes, chewing tobacco, and vaping devices, such as e-cigarettes. If you need help quitting, ask your health care provider. Do not use drugs. If you are sexually active, practice safe sex. Use a condom or other form of protection to   prevent STIs. If you do not wish to become pregnant, use a form of birth control. If you plan to become pregnant, see your health care provider for a  prepregnancy visit. Take aspirin only as told by your health care provider. Make sure that you understand how much to take and what form to take. Work with your health care provider to find out whether it is safe and beneficial for you to take aspirin daily. Find healthy ways to manage stress, such as: Meditation, yoga, or listening to music. Journaling. Talking to a trusted person. Spending time with friends and family. Minimize exposure to UV radiation to reduce your risk of skin cancer. Safety Always wear your seat belt while driving or riding in a vehicle. Do not drive: If you have been drinking alcohol. Do not ride with someone who has been drinking. When you are tired or distracted. While texting. If you have been using any mind-altering substances or drugs. Wear a helmet and other protective equipment during sports activities. If you have firearms in your house, make sure you follow all gun safety procedures. Seek help if you have been physically or sexually abused. What's next? Visit your health care provider once a year for an annual wellness visit. Ask your health care provider how often you should have your eyes and teeth checked. Stay up to date on all vaccines. This information is not intended to replace advice given to you by your health care provider. Make sure you discuss any questions you have with your health care provider. Document Revised: 02/14/2021 Document Reviewed: 02/14/2021 Elsevier Patient Education  [place].

## 2022-04-02 NOTE — Assessment & Plan Note (Signed)
Per cardiology 

## 2022-04-02 NOTE — Assessment & Plan Note (Signed)
ghm utd Check labs  See avs  

## 2022-04-02 NOTE — Progress Notes (Addendum)
Subjective:   By signing my name below, I, Luiz Ochoa, attest that this documentation has been prepared under the direction and in the presence of Ann Held, DO 04/02/2022    Patient ID: Yvonne Sanders, female    DOB: 1960/12/17, 61 y.o.   MRN: [id number]  Chief Complaint  Patient presents with   Annual Exam    Pt states fasting.    Knee Pain    Right knee, x2 weeks, pt states she stepped in a hole and states knee twisted. Pt states having pain and swelling.     HPI Patient is in today for comprehensive physical.  She has been experiencing knee pain in her right two weeks after stepping into a hole. She adds that it feels like as if she has fluid in her right knee, and the discomfort causes her to ambulate differently.   Of note, she has torn her meniscus and fractured her tibia before.   The pain in her right lower extremity improves when she uses an ice pack. Reports that her back discomfort has improved.   She remains compliant with Repatha and says that she received more blood pressure medication due to it not being controlled. She is compliant with her medical creme.   She denies having any fever, new moles, congestion, sinus pain, sore throat, chest pain, palpitations, cough, shortness of breath, wheezing, nausea, vomiting, diarrhea, constipation, dysuria, frequency, abdominal pain, hematuria, headaches.  She tries to exercise regularly and stay active by doing yard work, but it has become more difficult due to her right lower extremity. Her sleep has improved prior to the visit, but she does take melatonin if she has difficulty falling asleep.   She has tried to follow the low-card diet, but it has not improved her symptoms.  No recent surgical operations, and is up-to-date with her vaccinations. She regularly sees the ophthalmologist, dentist and gynecologist. Her gynecologist did not do a pap smear. She is UTD with her mammograms. Reports not having any  recent bone density exams.  Past Medical History:  Diagnosis Date   Abnormal pap 1989   cryo   Arthritis    Cataract    Chicken pox    Endometriosis    Fibroid 1994   History of post uterine   Gallstones    GERD (gastroesophageal reflux disease)    Glaucoma    Headache(784.0)    Hyperlipidemia    Hypertension        Migraine    Positive TB test    Tibia fracture    Right leg.      Past Surgical History:  Procedure Laterality Date   APPENDECTOMY  41   BREAST EXCISIONAL BIOPSY Right    CHOLECYSTECTOMY  88   DIAGNOSTIC LAPAROSCOPY  88   endometrious   GLAUCOMA SURGERY Left 2019   LUMBAR FUSION  08/2011   l 3-4   LUMBAR FUSION     l 4-5   RIGHT OOPHORECTOMY  75    Family History  Problem Relation Age of Onset   Hyperlipidemia Mother    Hypertension Mother    Hypertension Father    Glaucoma Father    Prostate cancer Father    Renal Disease Father    Macular degeneration Father    Stroke Father    Kidney disease Father    Heart disease Father    Kidney failure Father    Hypertension Sister    Hyperlipidemia Sister    Hypertension Sister  Hypertension Brother    Heart disease Brother    Hypertension Brother    Breast cancer Maternal Grandmother    Breast cancer Maternal Aunt    Diabetes Maternal Aunt    Diabetes Paternal Aunt    Breast cancer Cousin    Colon cancer Neg Hx    Colon polyps Neg Hx    Stomach cancer Neg Hx    Esophageal cancer Neg Hx     Social History   Socioeconomic History   Marital status: Married    Spouse name: Not on file   Number of children: 1   Years of education: Not on file   Highest education level: Not on file  Occupational History   Occupation: Surgical coodinator  Tobacco Use   Smoking status: Never   Smokeless tobacco: Never  Vaping Use   Vaping Use: Never used  Substance and Sexual Activity   Alcohol use: Not Currently   Drug use: No   Sexual activity: Yes    Partners: Male    Birth control/protection:  Post-menopausal  Other Topics Concern   Not on file  Social History Narrative   Not much exercise   Social Determinants of Health   Financial Resource Strain: Not on file  Food Insecurity: Not on file  Transportation Needs: Not on file  Physical Activity: Not on file  Stress: Not on file  Social Connections: Not on file  Intimate Partner Violence: Not on file    Outpatient Medications Prior to Visit  Medication Sig Dispense Refill   ascorbic acid (VITAMIN C) 500 MG tablet Take 500 mg by mouth daily.     aspirin-acetaminophen-caffeine (EXCEDRIN MIGRAINE) 250-250-65 MG tablet Take 1 tablet by mouth every 6 (six) hours as needed for headache or migraine.     Biotin 5000 MCG CAPS Take 5,000 mcg by mouth daily.     Brinzolamide-Brimonidine 1-0.2 % SUSP Place 1 drop into both eyes 3 (three) times daily.      Calcium Carbonate Antacid (TUMS PO) Take 1 tablet by mouth daily as needed (heartburn).      carisoprodol (SOMA) 350 MG tablet TAKE 1 TABLET BY MOUTH 4 TIMES DAILY AS NEEDED FOR MUSCLE SPASM 60 tablet 0   Cholecalciferol (VITAMIN D3) 2000 units TABS Take 1 tablet by mouth daily.     Coenzyme Q10 200 MG capsule Take 400 mg by mouth daily.     cycloSPORINE (RESTASIS) 0.05 % ophthalmic emulsion Place 1 drop into both eyes 2 (two) times daily.     diclofenac Sodium (VOLTAREN) 1 % GEL APPLY 4 GRAMS TOPICALLY TO THE AFFECTED AREA FOUR TIMES DAILY 300 g 0   esomeprazole (NEXIUM) 40 MG capsule Take 1 capsule by mouth once daily 90 capsule 1   Evolocumab (REPATHA SURECLICK) 916 MG/ML SOAJ INJECT 1 ML INTO THE SKIN EVERY 14 DAYS 2 mL 11   ezetimibe (ZETIA) 10 MG tablet Take 1 tablet by mouth once daily 90 tablet 1   folic acid (FOLVITE) 384 MCG tablet Take 400 mcg by mouth daily.     ibuprofen (ADVIL) 200 MG tablet Take 600 mg by mouth as needed.     losartan-hydrochlorothiazide (HYZAAR) 100-25 MG tablet Take 1 tablet by mouth daily. 90 tablet 3   Magnesium 100 MG CAPS Take 1 capsule by mouth  daily.     metoprolol succinate (TOPROL-XL) 50 MG 24 hr tablet TAKE 1 TABLET BY MOUTH ONCE DAILY WITH  OR  IMMEDIATELY  FOLLOWING  A  MEAL 30 tablet 3  Multiple Vitamins-Minerals (MULTIVITAMINS THER. W/MINERALS) TABS Take 1 tablet by mouth daily.     Netarsudil-Latanoprost 0.02-0.005 % SOLN Apply 1 drop to eye at bedtime.     rosuvastatin (CRESTOR) 40 MG tablet Take 1 tablet by mouth once daily 90 tablet 1   vitamin B-12 (CYANOCOBALAMIN) 100 MCG tablet Take 100 mcg by mouth daily. Unsure of strength     zinc gluconate 50 MG tablet Take 50 mg by mouth daily.     HYDROcodone-acetaminophen (NORCO/VICODIN) 5-325 MG tablet Take 1 tablet by mouth every 6 (six) hours as needed for moderate pain. 30 tablet 0   No facility-administered medications prior to visit.    Allergies  Allergen Reactions   Nubain [Nalbuphine Hcl] Anaphylaxis   Corticosteroids Other (See Comments)    Due to glaucoma can not have steroids   Dilaudid [Hydromorphone Hcl] Nausea And Vomiting    Severe itching   Nubain [Nalbuphine Hcl] Nausea And Vomiting   Oxycodone-Acetaminophen Itching   Percodan [Oxycodone-Aspirin] Hives and Itching   Tomato Rash    Rash, spots on tongue    Review of Systems  Constitutional:  Negative for fever.  HENT:  Negative for congestion, sinus pain and sore throat.   Respiratory:  Negative for cough, shortness of breath and wheezing.   Cardiovascular:  Negative for chest pain and palpitations.  Gastrointestinal:  Negative for abdominal pain, constipation, diarrhea, nausea and vomiting.  Genitourinary:  Negative for dysuria, frequency and hematuria.  Musculoskeletal:  Positive for joint pain. Negative for myalgias.       (+) Pain in the right lower extremity due to knee discomfort  Skin:        (-) New Moles  Neurological:  Negative for headaches.       Objective:    Physical Exam Constitutional:      Appearance: Normal appearance. She is not ill-appearing.  HENT:     Head:  Normocephalic and atraumatic.     Right Ear: Tympanic membrane, ear canal and external ear normal.     Left Ear: Tympanic membrane, ear canal and external ear normal.  Eyes:     Extraocular Movements: Extraocular movements intact.     Pupils: Pupils are equal, round, and reactive to light.  Cardiovascular:     Rate and Rhythm: Normal rate and regular rhythm.     Heart sounds: Murmur (soft murmur) heard.     No gallop.  Pulmonary:     Effort: Pulmonary effort is normal. No respiratory distress.     Breath sounds: Normal breath sounds. No wheezing or rales.  Abdominal:     General: Bowel sounds are normal. There is no distension.     Palpations: Abdomen is soft.     Tenderness: There is no abdominal tenderness. There is no guarding.  Musculoskeletal:        General: Tenderness present.     Right knee: Swelling present. Normal range of motion. Tenderness present.     Left knee: No swelling. No tenderness.     Comments: Pain with weight bearing -- R knee  Skin:    General: Skin is warm and dry.  Neurological:     Mental Status: She is alert and oriented to person, place, and time.  Psychiatric:        Judgment: Judgment normal.     BP 110/78 (BP Location: Left Arm, Patient Position: Sitting, Cuff Size: Large)   Pulse 70   Temp 97.8 F (36.6 C) (Oral)   Resp 18  Ht '5\' 4"'  (1.626 m)   Wt 209 lb 12.8 oz (95.2 kg)   LMP 09/03/2007 (Approximate)   SpO2 97%   BMI 36.01 kg/m  Wt Readings from Last 3 Encounters:  04/02/22 209 lb 12.8 oz (95.2 kg)  11/22/21 205 lb 3.2 oz (93.1 kg)  10/24/21 200 lb (90.7 kg)    Diabetic Foot Exam - Simple   No data filed    Lab Results  Component Value Date   WBC 3.9 (L) 03/30/2021   HGB 12.4 03/30/2021   HCT 38.9 03/30/2021   PLT 170.0 03/30/2021   GLUCOSE 83 01/31/2022   CHOL 137 10/02/2021   TRIG 75.0 10/02/2021   HDL 70.20 10/02/2021   LDLCALC 51 10/02/2021   ALT 30 10/02/2021   AST 24 10/02/2021   NA 143 01/31/2022   K 4.2  01/31/2022   CL 101 01/31/2022   CREATININE 0.68 01/31/2022   BUN 12 01/31/2022   CO2 25 01/31/2022   TSH 0.98 03/30/2021   HGBA1C 5.1 03/30/2021    Lab Results  Component Value Date   TSH 0.98 03/30/2021   Lab Results  Component Value Date   WBC 3.9 (L) 03/30/2021   HGB 12.4 03/30/2021   HCT 38.9 03/30/2021   MCV 85.9 03/30/2021   PLT 170.0 03/30/2021   Lab Results  Component Value Date   NA 143 01/31/2022   K 4.2 01/31/2022   CO2 25 01/31/2022   GLUCOSE 83 01/31/2022   BUN 12 01/31/2022   CREATININE 0.68 01/31/2022   BILITOT 0.5 10/02/2021   ALKPHOS 67 10/02/2021   AST 24 10/02/2021   ALT 30 10/02/2021   PROT 7.0 10/02/2021   ALBUMIN 4.3 10/02/2021   CALCIUM 9.9 01/31/2022   EGFR 100 01/31/2022   GFR 95.38 10/02/2021   Lab Results  Component Value Date   CHOL 137 10/02/2021   Lab Results  Component Value Date   HDL 70.20 10/02/2021   Lab Results  Component Value Date   LDLCALC 51 10/02/2021   Lab Results  Component Value Date   TRIG 75.0 10/02/2021   Lab Results  Component Value Date   CHOLHDL 2 10/02/2021   Lab Results  Component Value Date   HGBA1C 5.1 03/30/2021        Colonoscopy- Last completed 06/29/2020.  Results: The perianal and digital rectal examinations were normal.Two sessile polyps were found in the cecum. The polyps were 1 to 2 mm in size. These polyps were removed with a cold snare. Resection and retrieval were complete. Estimated blood loss was minimal. The exam was otherwise without abnormality on direct and retroflexion views.  Follow up in 5 years.  Mammogram- Last completed 06/21/2021. Results are normal. Follow up in 1 year.  Pap Smear- Last completed 10/24/2021. Results are normal. Follow up in 3 years.  Assessment & Plan:   Problem List Items Addressed This Visit       Unprioritized   Chronic pain syndrome   Relevant Medications   HYDROcodone-acetaminophen (NORCO/VICODIN) 5-325 MG tablet   Preventative health  care - Primary    ghm utd Check labs  See avs       Relevant Orders   CBC with Differential/Platelet   Comprehensive metabolic panel   Lipid panel   TSH   Lumbar foraminal stenosis    Per neurosurgeon       Hyperlipidemia    Encourage heart healthy diet such as MIND or DASH diet, increase exercise, avoid trans fats, simple carbohydrates and  processed foods, consider a krill or fish or flaxseed oil cap daily.       Relevant Medications   HYDROcodone-acetaminophen (NORCO/VICODIN) 5-325 MG tablet   Other Relevant Orders   CBC with Differential/Platelet   Comprehensive metabolic panel   Lipid panel   HTN (hypertension)    Well controlled, no changes to meds. Encouraged heart healthy diet such as the DASH diet and exercise as tolerated.       Relevant Orders   CBC with Differential/Platelet   Comprehensive metabolic panel   Lipid panel   TSH   Heart murmur, systolic    Per cardiology      Chronic glaucoma    Per opth      Other Visit Diagnoses     High risk medication use       Relevant Orders   Drug Monitoring Panel [phone] , Urine   Acute pain of right knee            Meds ordered this encounter  Medications   HYDROcodone-acetaminophen (NORCO/VICODIN) 5-325 MG tablet    Sig: Take 1 tablet by mouth every 6 (six) hours as needed for moderate pain.    Dispense:  30 tablet    Refill:  0    I, Ann Held, DO, personally preformed the services described in this documentation.  All medical record entries made by the scribe were at my direction and in my presence.  I have reviewed the chart and discharge instructions (if applicable) and agree that the record reflects my personal performance and is accurate and complete. 04/02/2022   I,Tinashe Williams,acting as a scribe for Ann Held, DO.,have documented all relevant documentation on the behalf of Ann Held, DO,as directed by  Ann Held, DO while in the presence of Ann Held, DO.    Ann Held, DO

## 2022-04-02 NOTE — Telephone Encounter (Signed)
[facility] rep called stating they needed clarification on a Diag code that was associated with the hydrocodone that was sent in:  Chronic Pain Syndrome [G89.4]

## 2022-04-03 NOTE — Telephone Encounter (Signed)
Spoke with pharmacy.Confirmed Dx

## 2022-04-05 LAB — DM TEMPLATE

## 2022-04-05 LAB — DRUG MONITORING PANEL 376104, URINE
Amphetamines: NEGATIVE ng/mL (ref <500)
Barbiturates: NEGATIVE ng/mL (ref <300)
Benzodiazepines: NEGATIVE ng/mL (ref <100)
Cocaine Metabolite: NEGATIVE ng/mL (ref <150)
Desmethyltramadol: NEGATIVE ng/mL (ref <100)
Opiates: NEGATIVE ng/mL (ref <100)
Oxycodone: NEGATIVE ng/mL (ref <100)
Tramadol: NEGATIVE ng/mL (ref <100)

## 2022-05-02 ENCOUNTER — Other Ambulatory Visit: Payer: Self-pay | Admitting: Family Medicine

## 2022-05-02 DIAGNOSIS — G8929 Other chronic pain: Secondary | ICD-10-CM

## 2022-05-12 NOTE — Progress Notes (Unsigned)
Cardiology Office Note:    Date:  05/14/2022   ID:  [name], DOB May 14, 1961, MRN [id number]  PCP:  Carollee Herter, Alferd Apa, DO  Cardiologist:  Sinclair Grooms, MD   Referring MD: Carollee Herter, Alferd Apa, *   Chief Complaint  Patient presents with   Cardiac Valve Problem    Aortic stenosis    History of Present Illness:    Yvonne Sanders is a 61 y.o. female with a hx of moderate  aortic valve stenosis, high risk coronary calcium score (1), family history of vascular disease and end-stage kidney disease, essential hypertension, and LVH.     Yvonne Sanders relative to aortic stenosis.  Echo done in April revealed maximum aortic valve outflow velocity of 3.1 m/s.  She denies orthopnea, PND, syncope, and angina.  Past Medical History:  Diagnosis Date   Abnormal pap 1989   cryo   Arthritis    Cataract    Chicken pox    Endometriosis    Fibroid 1994   History of post uterine   Gallstones    GERD (gastroesophageal reflux disease)    Glaucoma    Headache(784.0)    Hyperlipidemia    Hypertension        Migraine    Positive TB test    Tibia fracture    Right leg.      Past Surgical History:  Procedure Laterality Date   APPENDECTOMY  29   BREAST EXCISIONAL BIOPSY Right    CHOLECYSTECTOMY  88   DIAGNOSTIC LAPAROSCOPY  88   endometrious   GLAUCOMA SURGERY Left 2019   LUMBAR FUSION  08/2011   l 3-4   LUMBAR FUSION     l 4-5   RIGHT OOPHORECTOMY  75    Current Medications: Current Meds  Medication Sig   ascorbic acid (VITAMIN C) 500 MG tablet Take 500 mg by mouth daily.   aspirin-acetaminophen-caffeine (EXCEDRIN MIGRAINE) 250-250-65 MG tablet Take 1 tablet by mouth every 6 (six) hours as needed for headache or migraine.   Biotin 5000 MCG CAPS Take 5,000 mcg by mouth daily.   Brinzolamide-Brimonidine 1-0.2 % SUSP Place 1 drop into both eyes 3 (three) times daily.    Calcium Carbonate Antacid (TUMS PO) Take 1 tablet by mouth daily as needed  (heartburn).    carisoprodol (SOMA) 350 MG tablet TAKE 1 TABLET BY MOUTH 4 TIMES DAILY AS NEEDED FOR MUSCLE SPASM   Cholecalciferol (VITAMIN D3) 2000 units TABS Take 1 tablet by mouth daily.   Coenzyme Q10 200 MG capsule Take 400 mg by mouth daily.   cycloSPORINE (RESTASIS) 0.05 % ophthalmic emulsion Place 1 drop into both eyes 2 (two) times daily.   diclofenac Sodium (VOLTAREN) 1 % GEL Apply 4 g topically 4 (four) times daily.   esomeprazole (NEXIUM) 40 MG capsule Take 1 capsule by mouth once daily   Evolocumab (REPATHA SURECLICK) 622 MG/ML SOAJ INJECT 1 ML INTO THE SKIN EVERY 14 DAYS   ezetimibe (ZETIA) 10 MG tablet Take 1 tablet by mouth once daily   folic acid (FOLVITE) 297 MCG tablet Take 400 mcg by mouth daily.   HYDROcodone-acetaminophen (NORCO/VICODIN) 5-325 MG tablet Take 1 tablet by mouth every 6 (six) hours as needed for moderate pain.   ibuprofen (ADVIL) 200 MG tablet Take 600 mg by mouth as needed.   losartan-hydrochlorothiazide (HYZAAR) 100-25 MG tablet Take 1 tablet by mouth daily.   Magnesium 100 MG CAPS Take 1 capsule by mouth daily.  metoprolol succinate (TOPROL-XL) 50 MG 24 hr tablet TAKE 1 TABLET BY MOUTH ONCE DAILY WITH  OR  IMMEDIATELY  FOLLOWING  A  MEAL   Multiple Vitamins-Minerals (MULTIVITAMINS THER. W/MINERALS) TABS Take 1 tablet by mouth daily.   Netarsudil-Latanoprost 0.02-0.005 % SOLN Apply 1 drop to eye at bedtime.   rosuvastatin (CRESTOR) 40 MG tablet Take 1 tablet by mouth once daily   vitamin B-12 (CYANOCOBALAMIN) 100 MCG tablet Take 100 mcg by mouth daily. Unsure of strength   zinc gluconate 50 MG tablet Take 50 mg by mouth daily.     Allergies:   Nubain [nalbuphine hcl], Corticosteroids, Dilaudid [hydromorphone hcl], Nubain [nalbuphine hcl], Oxycodone-acetaminophen, Percodan [oxycodone-aspirin], and Tomato   Social History   Socioeconomic History   Marital status: Married    Spouse name: Not on file   Number of children: 1   Years of education: Not  on file   Highest education level: Not on file  Occupational History   Occupation: Surgical coodinator  Tobacco Use   Smoking status: Never   Smokeless tobacco: Never  Vaping Use   Vaping Use: Never used  Substance and Sexual Activity   Alcohol use: Not Currently   Drug use: No   Sexual activity: Yes    Partners: Male    Birth control/protection: Post-menopausal  Other Topics Concern   Not on file  Social History Narrative   Not much exercise   Social Determinants of Health   Financial Resource Strain: Not on file  Food Insecurity: Not on file  Transportation Needs: Not on file  Physical Activity: Not on file  Stress: Not on file  Social Connections: Not on file     Family History: The patient's family history includes Breast cancer in her cousin, maternal aunt, and maternal grandmother; Diabetes in her maternal aunt and paternal aunt; Glaucoma in her father; Heart disease in her brother and father; Hyperlipidemia in her mother and sister; Hypertension in her brother, brother, father, mother, sister, and sister; Kidney disease in her father; Kidney failure in her father; Macular degeneration in her father; Prostate cancer in her father; Renal Disease in her father; Stroke in her father. There is no history of Colon cancer, Colon polyps, Stomach cancer, or Esophageal cancer.  ROS:   Please see the history of present illness.    Right knee meniscus tear all other systems reviewed and are negative.  EKGs/Labs/Other Studies Reviewed:    The following studies were reviewed today:  2 D Doppler ECHOCARDIOGRAM 2023 IMPRESSIONS     1. Left ventricular ejection fraction, by estimation, is 60 to 65%. The  left ventricle has normal function. The left ventricle has no regional  wall motion abnormalities. There is mild concentric left ventricular  hypertrophy. Left ventricular diastolic  parameters were normal.   2. Right ventricular systolic function is normal. The right  ventricular  size is normal.   3. The mitral valve is normal in structure. No evidence of mitral valve  regurgitation. No evidence of mitral stenosis.   4. The aortic valve is calcified. There is moderate calcification of the  aortic valve. There is mild thickening of the aortic valve. Aortic valve  regurgitation is mild. Moderate aortic valve stenosis. Aortic valve area,  by VTI measures 1.08 cm. Aortic  valve mean gradient measures 19.0 mmHg. Aortic valve Vmax measures 3.07  m/s.   5. The inferior vena cava is normal in size with greater than 50%  respiratory variability, suggesting right atrial pressure of 3 mmHg.  Comparison(s): A prior study was performed on 12/08/2020. No significant  change from prior study.    EKG:  EKG not repeated  Recent Labs: 04/02/2022: ALT 47; BUN 12; Creatinine, Ser 0.78; Hemoglobin 11.6; Platelets 191.0; Potassium 3.6; Sodium 137; TSH 0.74  Recent Lipid Panel    Component Value Date/Time   CHOL 131 04/02/2022 0920   TRIG 57.0 04/02/2022 0920   HDL 70.30 04/02/2022 0920   CHOLHDL 2 04/02/2022 0920   VLDL 11.4 04/02/2022 0920   LDLCALC 49 04/02/2022 0920    Physical Exam:    VS:  BP 100/62   Pulse 71   Ht '5\' 4"'$  (1.626 m)   Wt 207 lb 9.6 oz (94.2 kg)   LMP 09/03/2007 (Approximate)   SpO2 96%   BMI 35.63 kg/m     Wt Readings from Last 3 Encounters:  05/14/22 207 lb 9.6 oz (94.2 kg)  04/02/22 209 lb 12.8 oz (95.2 kg)  11/22/21 205 lb 3.2 oz (93.1 kg)     GEN: Healthy appearing with stable weight. No acute distress HEENT: Normal NECK: No JVD. LYMPHATICS: No lymphadenopathy CARDIAC: 2-3/6 right upper sternal and left mid sternal diamond-shaped aortic stenosis murmur. RRR S4 but no S3 gallop, or edema. VASCULAR:  Normal Pulses. No bruits. RESPIRATORY:  Clear to auscultation without rales, wheezing or rhonchi  ABDOMEN: Soft, non-tender, non-distended, No pulsatile mass, MUSCULOSKELETAL: No deformity  SKIN: Warm and dry NEUROLOGIC:   Alert and oriented x 3 PSYCHIATRIC:  Normal affect   ASSESSMENT:    1. Aortic stenosis, moderate   2. Coronary artery disease involving native coronary artery of native heart without angina pectoris   3. Other hyperlipidemia   4. Essential hypertension   5. Morbid obesity ([place])    PLAN:    In order of problems listed above:  She is clinically stable.  She will have an echocardiogram performed in April and see Dr. Glenford Bayley shortly thereafter for longitudinal follow-up. Continue secondary prevention. Continue Zetia and Repatha.  Most recent LDL less than a month ago was 49. Blood pressure is well controlled on current regimen that includes Toprol and Hyzaar. She understands the relationship between weight and cardiovascular morbidity.  She is walking.  She is holding her weight stable.  Overall education and awareness concerning primary/secondary risk prevention was discussed in detail: LDL less than 70, hemoglobin A1c less than 7, blood pressure target less than 130/80 mmHg, >150 minutes of moderate aerobic activity per week, avoidance of smoking, weight control (via diet and exercise), and continued surveillance/management of/for obstructive sleep apnea.    Medication Adjustments/Labs and Tests Ordered: Current medicines are reviewed at length with the patient today.  Concerns regarding medicines are outlined above.  Orders Placed This Encounter  Procedures   ECHOCARDIOGRAM COMPLETE   No orders of the defined types were placed in this encounter.   Patient Instructions  Medication Instructions:  Your physician recommends that you continue on your current medications as directed. Please refer to the Current Medication list given to you today.  *If you need a refill on your cardiac medications before your next appointment, please call your pharmacy*  Lab Work: NONE  Testing/Procedures: Your physician has requested that you have an echocardiogram in March or April 2024 (1  month prior to follow-up office visit). Echocardiography is a painless test that uses sound waves to create images of your heart. It provides your doctor with information about the size and shape of your heart and how well your heart's chambers and  valves are working. This procedure takes approximately one hour. There are no restrictions for this procedure.  Follow-Up: At [facility], you and your health needs are our priority.  As part of our continuing mission to provide you with exceptional heart care, we have created designated Provider Care Teams.  These Care Teams include your primary Cardiologist (physician) and Advanced Practice Providers (APPs -  Physician Assistants and Nurse Practitioners) who all work together to provide you with the care you need, when you need it.  Your next appointment:   7-8 month(s)  The format for your next appointment:   In Person  Provider:   Rudean Haskell, MD  Important Information About Sugar         Signed, Sinclair Grooms, MD  05/14/2022 9:16 AM    [place]

## 2022-05-14 ENCOUNTER — Ambulatory Visit
Payer: No Typology Code available for payment source | Attending: Interventional Cardiology | Admitting: Interventional Cardiology

## 2022-05-14 ENCOUNTER — Encounter: Payer: Self-pay | Admitting: Interventional Cardiology

## 2022-05-14 VITALS — BP 100/62 | HR 71 | Ht 64.0 in | Wt 207.6 lb

## 2022-05-14 DIAGNOSIS — I350 Nonrheumatic aortic (valve) stenosis: Secondary | ICD-10-CM

## 2022-05-14 DIAGNOSIS — E7849 Other hyperlipidemia: Secondary | ICD-10-CM

## 2022-05-14 DIAGNOSIS — I10 Essential (primary) hypertension: Secondary | ICD-10-CM

## 2022-05-14 DIAGNOSIS — I2510 Atherosclerotic heart disease of native coronary artery without angina pectoris: Secondary | ICD-10-CM

## 2022-05-14 DIAGNOSIS — E785 Hyperlipidemia, unspecified: Secondary | ICD-10-CM

## 2022-05-14 NOTE — Patient Instructions (Signed)
Medication Instructions:  Your physician recommends that you continue on your current medications as directed. Please refer to the Current Medication list given to you today.  *If you need a refill on your cardiac medications before your next appointment, please call your pharmacy*  Lab Work: NONE  Testing/Procedures: Your physician has requested that you have an echocardiogram in March or April 2024 (1 month prior to follow-up office visit). Echocardiography is a painless test that uses sound waves to create images of your heart. It provides your doctor with information about the size and shape of your heart and how well your heart's chambers and valves are working. This procedure takes approximately one hour. There are no restrictions for this procedure.  Follow-Up: At [facility], you and your health needs are our priority.  As part of our continuing mission to provide you with exceptional heart care, we have created designated Provider Care Teams.  These Care Teams include your primary Cardiologist (physician) and Advanced Practice Providers (APPs -  Physician Assistants and Nurse Practitioners) who all work together to provide you with the care you need, when you need it.  Your next appointment:   7-8 month(s)  The format for your next appointment:   In Person  Provider:   Rudean Haskell, MD  Important Information About Sugar

## 2022-07-17 ENCOUNTER — Other Ambulatory Visit: Payer: Self-pay | Admitting: Family Medicine

## 2022-07-17 DIAGNOSIS — M48061 Spinal stenosis, lumbar region without neurogenic claudication: Secondary | ICD-10-CM

## 2022-07-18 NOTE — Telephone Encounter (Signed)
Requesting: carisoprodol '350mg'$   Contract: 04/02/22 UDS: 04/02/22 Last Visit: 04/02/22 Next Visit: 11/11/22 Last Refill: 03/14/22 #60 and 0RF   Please Advise

## 2022-08-22 ENCOUNTER — Other Ambulatory Visit: Payer: Self-pay

## 2022-08-22 DIAGNOSIS — I10 Essential (primary) hypertension: Secondary | ICD-10-CM

## 2022-08-22 MED ORDER — METOPROLOL SUCCINATE ER 50 MG PO TB24: ORAL_TABLET | ORAL | 2 refills | Status: DC

## 2022-09-03 ENCOUNTER — Other Ambulatory Visit (HOSPITAL_COMMUNITY): Payer: Self-pay

## 2022-09-05 ENCOUNTER — Telehealth: Payer: Self-pay

## 2022-09-05 ENCOUNTER — Other Ambulatory Visit (HOSPITAL_COMMUNITY): Payer: Self-pay

## 2022-09-05 NOTE — Telephone Encounter (Signed)
Pharmacy Patient Advocate Encounter   Received notification from [facility] that prior authorization for REPATHA 140 MG/ML INJ is needed.    PA submitted on 09/05/22 Key [id number] Status is pending  Karie Soda, [place] Patient Advocate Specialist Direct Number: [phone] Fax: [phone]

## 2022-09-06 ENCOUNTER — Other Ambulatory Visit (HOSPITAL_COMMUNITY): Payer: Self-pay

## 2022-09-09 ENCOUNTER — Other Ambulatory Visit (HOSPITAL_COMMUNITY): Payer: Self-pay

## 2022-09-09 NOTE — Telephone Encounter (Signed)
Pharmacy Patient Advocate Encounter  Prior Authorization for REPATHA 140 MG/ML INJ is not needed. Per test billing, plan is paying currently for a 30 day supply without PA.     PA# [id number]   Patient recently picked up 09/2022  Yvonne Sanders, [employer] Patient Advocate Specialist Direct Number: [phone] Fax: [phone]

## 2022-10-01 ENCOUNTER — Other Ambulatory Visit: Payer: Self-pay | Admitting: Family Medicine

## 2022-10-01 DIAGNOSIS — E785 Hyperlipidemia, unspecified: Secondary | ICD-10-CM

## 2022-10-08 ENCOUNTER — Ambulatory Visit: Payer: No Typology Code available for payment source | Admitting: Family Medicine

## 2022-10-16 NOTE — Progress Notes (Signed)
62 y.o. G69P1001 Married [place] American female here for annual exam.    No GYN concerns.  Denies vaginal bleeding.   Denies urinary incontinence, constipation, or fecal incontinence.  Occasionally feels her bladder prolapse.   PCP:   Dr. Carollee Herter  Patient's last menstrual period was 09/03/2007 (approximate).           Sexually active: Yes.    The current method of family planning is post menopausal status.    Exercising: No.     Smoker:  no  Health Maintenance: Pap:  10/24/21 neg: HR HPV neg, 09/22/19 neg: HR HPV neg History of abnormal Pap:  yes, Hx LGSIL 2011, 2012, 2013.  Pap ASCUS in 2014, and then normal in 2015 and 2016.  MMG:  06/21/21 Breast Density Category B, BI-RADS Category 1 Neg.  She will schedule.  Colonoscopy:  06/29/20 - polyps, due in 2026 BMD:   n/a  Result  n/a TDaP:  09/22/14 Gardasil:   no HIV: neg years ago Hep C: 10/12/15 neg Screening Labs:  PCP   reports that she has never smoked. She has never used smokeless tobacco. She reports that she does not currently use alcohol. She reports that she does not use drugs.  Past Medical History:  Diagnosis Date   Abnormal pap 1989   cryo   Arthritis    Cataract    Chicken pox    Endometriosis    Fibroid 1994   History of post uterine   Gallstones    GERD (gastroesophageal reflux disease)    Glaucoma    Headache(784.0)    Hyperlipidemia    Hypertension        Migraine    Positive TB test    Tibia fracture    Right leg.      Past Surgical History:  Procedure Laterality Date   APPENDECTOMY  52   BREAST EXCISIONAL BIOPSY Right    CHOLECYSTECTOMY  88   DIAGNOSTIC LAPAROSCOPY  88   endometrious   GLAUCOMA SURGERY Left 2019   LUMBAR FUSION  08/2011   l 3-4   LUMBAR FUSION     l 4-5   RIGHT OOPHORECTOMY  75    Current Outpatient Medications  Medication Sig Dispense Refill   ascorbic acid (VITAMIN C) 500 MG tablet Take 500 mg by mouth daily.     aspirin-acetaminophen-caffeine (EXCEDRIN  MIGRAINE) 250-250-65 MG tablet Take 1 tablet by mouth every 6 (six) hours as needed for headache or migraine.     Biotin 5000 MCG CAPS Take 5,000 mcg by mouth daily.     Brinzolamide-Brimonidine 1-0.2 % SUSP Place 1 drop into both eyes 3 (three) times daily.      Calcium Carbonate Antacid (TUMS PO) Take 1 tablet by mouth daily as needed (heartburn).      carisoprodol (SOMA) 350 MG tablet TAKE 1 TABLET BY MOUTH 4 TIMES DAILY AS NEEDED FOR MUSCLE SPASM 60 tablet 0   Cholecalciferol (VITAMIN D3) 2000 units TABS Take 1 tablet by mouth daily.     Coenzyme Q10 200 MG capsule Take 400 mg by mouth daily.     cycloSPORINE (RESTASIS) 0.05 % ophthalmic emulsion Place 1 drop into both eyes 2 (two) times daily.     diclofenac Sodium (VOLTAREN) 1 % GEL Apply 4 g topically 4 (four) times daily. 300 g 0   esomeprazole (NEXIUM) 40 MG capsule Take 1 capsule by mouth once daily 90 capsule 1   Evolocumab (REPATHA SURECLICK) [id number] MG/ML SOAJ INJECT 1 ML  INTO THE SKIN EVERY 14 DAYS 2 mL 11   ezetimibe (ZETIA) 10 MG tablet Take 1 tablet by mouth once daily 90 tablet 0   folic acid (FOLVITE) Q000111Q MCG tablet Take 400 mcg by mouth daily.     HYDROcodone-acetaminophen (NORCO/VICODIN) 5-325 MG tablet Take 1 tablet by mouth every 6 (six) hours as needed for moderate pain. 30 tablet 0   losartan-hydrochlorothiazide (HYZAAR) 100-25 MG tablet Take 1 tablet by mouth daily. 90 tablet 3   Magnesium 100 MG CAPS Take 1 capsule by mouth daily.     metoprolol succinate (TOPROL-XL) 50 MG 24 hr tablet TAKE 1 TABLET BY MOUTH ONCE DAILY WITH  OR  IMMEDIATELY  FOLLOWING  A  MEAL 90 tablet 2   Multiple Vitamins-Minerals (MULTIVITAMINS THER. W/MINERALS) TABS Take 1 tablet by mouth daily.     Netarsudil-Latanoprost 0.02-0.005 % SOLN Apply 1 drop to eye at bedtime.     rosuvastatin (CRESTOR) 40 MG tablet Take 1 tablet by mouth once daily 90 tablet 0   vitamin B-12 (CYANOCOBALAMIN) 100 MCG tablet Take 100 mcg by mouth daily. Unsure of strength      zinc gluconate 50 MG tablet Take 50 mg by mouth daily.     ibuprofen (ADVIL) 200 MG tablet Take 600 mg by mouth as needed. (Patient not taking: Reported on 10/30/2022)     No current facility-administered medications for this visit.    Family History  Problem Relation Age of Onset   Hyperlipidemia Mother    Hypertension Mother    Hypertension Father    Glaucoma Father    Prostate cancer Father    Renal Disease Father    Macular degeneration Father    Stroke Father    Kidney disease Father    Heart disease Father    Kidney failure Father    Hypertension Sister    Hyperlipidemia Sister    Hypertension Sister    Hypertension Brother    Heart disease Brother    Hypertension Brother    Breast cancer Maternal Grandmother    Breast cancer Maternal Aunt    Diabetes Maternal Aunt    Diabetes Paternal Aunt    Breast cancer Cousin    Colon cancer Neg Hx    Colon polyps Neg Hx    Stomach cancer Neg Hx    Esophageal cancer Neg Hx     Review of Systems  All other systems reviewed and are negative.   Exam:   BP 102/62 (BP Location: Left Arm, Patient Position: Sitting, Cuff Size: Large)   Pulse 67   Ht 5' 4.5" (1.638 m)   Wt 204 lb (92.5 kg)   LMP 09/03/2007 (Approximate)   SpO2 99%   BMI 34.48 kg/m     General appearance: alert, cooperative and appears stated age Head: normocephalic, without obvious abnormality, atraumatic Neck: no adenopathy, supple, symmetrical, trachea midline and thyroid normal to inspection and palpation Lungs: clear to auscultation bilaterally Breasts: normal appearance, no masses or tenderness, No nipple retraction or dimpling, No nipple discharge or bleeding, No axillary adenopathy Heart: regular rate and rhythm.  Systolic murmur. Abdomen: soft, non-tender; no masses, no organomegaly Extremities: extremities normal, atraumatic, no cyanosis or edema Skin: skin color, texture, turgor normal. No rashes or lesions Lymph nodes: cervical,  supraclavicular, and axillary nodes normal. Neurologic: grossly normal  Pelvic: External genitalia:  no lesions              No abnormal inguinal nodes palpated.  Urethra:  normal appearing urethra with no masses, tenderness or lesions              Bartholins and Skenes: normal                 Vagina: normal appearing vagina with normal color and discharge, no lesions.  Second degree cystocele, first degree uterine prolapse, first degree rectocele.              Cervix: no lesions              Pap taken: no Bimanual Exam:  Uterus:  normal size, contour, position, consistency, mobility, non-tender              Adnexa: no mass, fullness, tenderness              Rectal exam: yes.  Confirms.              Anus:  normal sphincter tone, no lesions  Chaperone was present for exam:  Emily  Assessment:   Well woman visit with gynecologic exam. Hx LGSIL.  Hx cryotherapy.  ?Hx LEEP.  Incomplete uterovaginal prolapse.  Second degree cystocele, first degree uterine prolapse, first degree rectocele. Glaucoma. Status post right oophorectomy.  Hx endometriosis. Hx systolic murmur.  Followed by cardiology.   Plan: Mammogram screening discussed.  She will schedule.  Self breast awareness reviewed. Pap and HR HPV 2028. Guidelines for Calcium, Vitamin D, regular exercise program including cardiovascular and weight bearing exercise. Labs with PCP.  Follow up annually and prn.   After visit summary provided.

## 2022-10-30 ENCOUNTER — Ambulatory Visit (INDEPENDENT_AMBULATORY_CARE_PROVIDER_SITE_OTHER): Payer: No Typology Code available for payment source | Admitting: Obstetrics and Gynecology

## 2022-10-30 ENCOUNTER — Encounter: Payer: Self-pay | Admitting: Obstetrics and Gynecology

## 2022-10-30 VITALS — BP 102/62 | HR 67 | Ht 64.5 in | Wt 204.0 lb

## 2022-10-30 DIAGNOSIS — Z01419 Encounter for gynecological examination (general) (routine) without abnormal findings: Secondary | ICD-10-CM | POA: Diagnosis not present

## 2022-10-30 NOTE — Patient Instructions (Signed)

## 2022-11-04 ENCOUNTER — Other Ambulatory Visit: Payer: Self-pay | Admitting: Pharmacist

## 2022-11-04 DIAGNOSIS — E7849 Other hyperlipidemia: Secondary | ICD-10-CM

## 2022-11-04 MED ORDER — REPATHA SURECLICK 140 MG/ML ~~LOC~~ SOAJ: SUBCUTANEOUS | 11 refills | Status: DC

## 2022-11-11 ENCOUNTER — Ambulatory Visit (INDEPENDENT_AMBULATORY_CARE_PROVIDER_SITE_OTHER): Payer: No Typology Code available for payment source | Admitting: Family Medicine

## 2022-11-11 ENCOUNTER — Encounter: Payer: Self-pay | Admitting: Family Medicine

## 2022-11-11 VITALS — BP 120/80 | HR 59 | Temp 98.5°F | Ht 64.5 in | Wt 206.6 lb

## 2022-11-11 DIAGNOSIS — I10 Essential (primary) hypertension: Secondary | ICD-10-CM

## 2022-11-11 DIAGNOSIS — E785 Hyperlipidemia, unspecified: Secondary | ICD-10-CM | POA: Diagnosis not present

## 2022-11-11 DIAGNOSIS — Z79899 Other long term (current) drug therapy: Secondary | ICD-10-CM

## 2022-11-11 DIAGNOSIS — M48061 Spinal stenosis, lumbar region without neurogenic claudication: Secondary | ICD-10-CM

## 2022-11-11 DIAGNOSIS — G894 Chronic pain syndrome: Secondary | ICD-10-CM | POA: Diagnosis not present

## 2022-11-11 LAB — COMPREHENSIVE METABOLIC PANEL
ALT: 34 U/L (ref 0–35)
AST: 25 U/L (ref 0–37)
Albumin: 4.1 g/dL (ref 3.5–5.2)
Alkaline Phosphatase: 78 U/L (ref 39–117)
BUN: 17 mg/dL (ref 6–23)
CO2: 26 mEq/L (ref 19–32)
Calcium: 9.6 mg/dL (ref 8.4–10.5)
Chloride: 106 mEq/L (ref 96–112)
Creatinine, Ser: 0.91 mg/dL (ref 0.40–1.20)
GFR: 68.11 mL/min (ref >60.00)
Glucose, Bld: 90 mg/dL (ref 70–99)
Potassium: 4.1 mEq/L (ref 3.5–5.1)
Sodium: 140 mEq/L (ref 135–145)
Total Bilirubin: 0.4 mg/dL (ref 0.2–1.2)
Total Protein: 6.9 g/dL (ref 6.0–8.3)

## 2022-11-11 LAB — LIPID PANEL
Cholesterol: 182 mg/dL (ref 0–200)
HDL: 70.9 mg/dL (ref >39.00)
LDL Cholesterol: 92 mg/dL (ref 0–99)
NonHDL: 111.34
Total CHOL/HDL Ratio: 3
Triglycerides: 95 mg/dL (ref 0.0–149.0)
VLDL: 19 mg/dL (ref 0.0–40.0)

## 2022-11-11 MED ORDER — CARISOPRODOL 350 MG PO TABS: ORAL_TABLET | ORAL | 1 refills | Status: DC

## 2022-11-11 MED ORDER — ROSUVASTATIN CALCIUM 40 MG PO TABS: 40.0000 mg | ORAL_TABLET | Freq: Every day | ORAL | 1 refills | Status: DC

## 2022-11-11 MED ORDER — HYDROCODONE-ACETAMINOPHEN 5-325 MG PO TABS: 1.0000 | ORAL_TABLET | Freq: Four times a day (QID) | ORAL | 0 refills | Status: DC | PRN

## 2022-11-11 MED ORDER — EZETIMIBE 10 MG PO TABS: 10.0000 mg | ORAL_TABLET | Freq: Every day | ORAL | 1 refills | Status: DC

## 2022-11-11 NOTE — Progress Notes (Addendum)
Subjective:   By signing my name below, I, Yvonne Sanders, attest that this documentation has been prepared under the direction and in the presence of Ann Held, DO. 11/11/2022   Patient ID: Yvonne Sanders, female    DOB: 11/08/1960, 62 y.o.   MRN: [id number]  Chief Complaint  Patient presents with   Hypertension   Hyperlipidemia   Follow-up    HPI Patient is in today for an office visit to f/u bp and cholesterol.   She is also f/u on back pain.  She uses opoid rarely when she is stiff.  She tries to get out in the yard now that the weather is nicer and move more.    Past Medical History:  Diagnosis Date   Abnormal pap 1989   cryo   Arthritis    Cataract    Chicken pox    Endometriosis    Fibroid 1994   History of post uterine   Gallstones    GERD (gastroesophageal reflux disease)    Glaucoma    Headache(784.0)    Hyperlipidemia    Hypertension        Migraine    Positive TB test    Tibia fracture    Right leg.      Past Surgical History:  Procedure Laterality Date   APPENDECTOMY  44   BREAST EXCISIONAL BIOPSY Right    CHOLECYSTECTOMY  88   DIAGNOSTIC LAPAROSCOPY  88   endometrious   GLAUCOMA SURGERY Left 2019   LUMBAR FUSION  08/2011   l 3-4   LUMBAR FUSION     l 4-5   RIGHT OOPHORECTOMY  75    Family History  Problem Relation Age of Onset   Hyperlipidemia Mother    Hypertension Mother    Hypertension Father    Glaucoma Father    Prostate cancer Father    Renal Disease Father    Macular degeneration Father    Stroke Father    Kidney disease Father    Heart disease Father    Kidney failure Father    Hypertension Sister    Hyperlipidemia Sister    Hypertension Sister    Hypertension Brother    Heart disease Brother    Hypertension Brother    Breast cancer Maternal Grandmother    Breast cancer Maternal Aunt    Diabetes Maternal Aunt    Diabetes Paternal Aunt    Breast cancer Cousin    Colon cancer Neg Hx    Colon polyps Neg  Hx    Stomach cancer Neg Hx    Esophageal cancer Neg Hx     Social History   Socioeconomic History   Marital status: Married    Spouse name: Not on file   Number of children: 1   Years of education: Not on file   Highest education level: Not on file  Occupational History   Occupation: Surgical coodinator  Tobacco Use   Smoking status: Never   Smokeless tobacco: Never  Vaping Use   Vaping Use: Never used  Substance and Sexual Activity   Alcohol use: Not Currently   Drug use: No   Sexual activity: Yes    Partners: Male    Birth control/protection: Post-menopausal  Other Topics Concern   Not on file  Social History Narrative   Not much exercise   Social Determinants of Health   Financial Resource Strain: Not on file  Food Insecurity: Not on file  Transportation Needs: Not on file  Physical Activity: Not on file  Stress: Not on file  Social Connections: Not on file  Intimate Partner Violence: Not on file    Outpatient Medications Prior to Visit  Medication Sig Dispense Refill   ascorbic acid (VITAMIN C) 500 MG tablet Take 500 mg by mouth daily.     aspirin-acetaminophen-caffeine (EXCEDRIN MIGRAINE) 250-250-65 MG tablet Take 1 tablet by mouth every 6 (six) hours as needed for headache or migraine.     Biotin 5000 MCG CAPS Take 5,000 mcg by mouth daily.     Brinzolamide-Brimonidine 1-0.2 % SUSP Place 1 drop into both eyes 3 (three) times daily.      Calcium Carbonate Antacid (TUMS PO) Take 1 tablet by mouth daily as needed (heartburn).      Cholecalciferol (VITAMIN D3) 2000 units TABS Take 1 tablet by mouth daily.     Coenzyme Q10 200 MG capsule Take 400 mg by mouth daily.     cycloSPORINE (RESTASIS) 0.05 % ophthalmic emulsion Place 1 drop into both eyes 2 (two) times daily.     diclofenac Sodium (VOLTAREN) 1 % GEL Apply 4 g topically 4 (four) times daily. 300 g 0   esomeprazole (NEXIUM) 40 MG capsule Take 1 capsule by mouth once daily 90 capsule 1   Evolocumab  (REPATHA SURECLICK) [id number] MG/ML SOAJ INJECT 1 ML INTO THE SKIN EVERY 14 DAYS 2 mL 11   folic acid (FOLVITE) Q000111Q MCG tablet Take 400 mcg by mouth daily.     losartan-hydrochlorothiazide (HYZAAR) 100-25 MG tablet Take 1 tablet by mouth daily. 90 tablet 3   Magnesium 100 MG CAPS Take 1 capsule by mouth daily.     metoprolol succinate (TOPROL-XL) 50 MG 24 hr tablet TAKE 1 TABLET BY MOUTH ONCE DAILY WITH  OR  IMMEDIATELY  FOLLOWING  A  MEAL 90 tablet 2   Multiple Vitamins-Minerals (MULTIVITAMINS THER. W/MINERALS) TABS Take 1 tablet by mouth daily.     Netarsudil-Latanoprost 0.02-0.005 % SOLN Apply 1 drop to eye at bedtime.     vitamin B-12 (CYANOCOBALAMIN) 100 MCG tablet Take 100 mcg by mouth daily. Unsure of strength     zinc gluconate 50 MG tablet Take 50 mg by mouth daily.     carisoprodol (SOMA) 350 MG tablet TAKE 1 TABLET BY MOUTH 4 TIMES DAILY AS NEEDED FOR MUSCLE SPASM 60 tablet 0   ezetimibe (ZETIA) 10 MG tablet Take 1 tablet by mouth once daily 90 tablet 0   HYDROcodone-acetaminophen (NORCO/VICODIN) 5-325 MG tablet Take 1 tablet by mouth every 6 (six) hours as needed for moderate pain. 30 tablet 0   rosuvastatin (CRESTOR) 40 MG tablet Take 1 tablet by mouth once daily 90 tablet 0   ibuprofen (ADVIL) 200 MG tablet Take 600 mg by mouth as needed. (Patient not taking: Reported on 10/30/2022)     No facility-administered medications prior to visit.    Allergies  Allergen Reactions   Nubain [Nalbuphine Hcl] Anaphylaxis   Corticosteroids Other (See Comments)    Due to glaucoma can not have steroids   Dilaudid [Hydromorphone Hcl] Nausea And Vomiting    Severe itching   Nubain [Nalbuphine Hcl] Nausea And Vomiting   Oxycodone-Acetaminophen Itching   Percodan [Oxycodone-Aspirin] Hives and Itching   Tomato Rash    Rash, spots on tongue    Review of Systems  Constitutional:  Negative for fever and malaise/fatigue.  HENT:  Negative for congestion.   Eyes:  Negative for blurred vision.   Respiratory:  Negative for shortness of breath.  Cardiovascular:  Negative for chest pain, palpitations and leg swelling.  Gastrointestinal:  Negative for abdominal pain, blood in stool and nausea.  Genitourinary:  Negative for dysuria and frequency.  Musculoskeletal:  Negative for falls.  Skin:  Negative for rash.  Neurological:  Negative for dizziness, loss of consciousness and headaches.  Endo/Heme/Allergies:  Negative for environmental allergies.  Psychiatric/Behavioral:  Negative for depression. The patient is not nervous/anxious.        Objective:    Physical Exam Vitals and nursing note reviewed.  Constitutional:      General: She is not in acute distress.    Appearance: She is well-developed.  HENT:     Head: Normocephalic and atraumatic.     Right Ear: External ear normal.     Left Ear: External ear normal.  Eyes:     Extraocular Movements: Extraocular movements intact.     Conjunctiva/sclera: Conjunctivae normal.     Pupils: Pupils are equal, round, and reactive to light.  Neck:     Thyroid: No thyromegaly.     Vascular: No carotid bruit or JVD.  Cardiovascular:     Rate and Rhythm: Normal rate and regular rhythm.     Heart sounds: Normal heart sounds. No murmur heard.    No gallop.     Comments: 2/5 heart murmur   Pulmonary:     Effort: Pulmonary effort is normal. No respiratory distress.     Breath sounds: Normal breath sounds. No wheezing or rales.  Chest:     Chest wall: No tenderness.  Musculoskeletal:     Cervical back: Normal range of motion and neck supple.  Skin:    General: Skin is warm.  Neurological:     Mental Status: She is alert and oriented to person, place, and time.     BP 120/80 (BP Location: Left Arm, Patient Position: Sitting, Cuff Size: Normal)   Pulse (!) 59   Temp 98.5 F (36.9 C) (Oral)   Ht 5' 4.5" (1.638 m)   Wt 206 lb 9.6 oz (93.7 kg)   LMP 09/03/2007 (Approximate)   SpO2 99%   BMI 34.92 kg/m  Wt Readings from Last 3  Encounters:  11/11/22 206 lb 9.6 oz (93.7 kg)  10/30/22 204 lb (92.5 kg)  05/14/22 207 lb 9.6 oz (94.2 kg)       Assessment & Plan:  Hyperlipidemia, unspecified hyperlipidemia type Assessment & Plan: Encourage heart healthy diet such as MIND or DASH diet, increase exercise, avoid trans fats, simple carbohydrates and processed foods, consider a krill or fish or flaxseed oil cap daily.    Orders: -     Ezetimibe; Take 1 tablet (10 mg total) by mouth daily.  Dispense: 90 tablet; Refill: 1 -     HYDROcodone-Acetaminophen; Take 1 tablet by mouth every 6 (six) hours as needed for moderate pain.  Dispense: 30 tablet; Refill: 0 -     Rosuvastatin Calcium; Take 1 tablet (40 mg total) by mouth daily.  Dispense: 90 tablet; Refill: 1 -     Comprehensive metabolic panel -     Lipid panel  Lumbar foraminal stenosis Assessment & Plan: Database reviewed  Uds and contract updated  Opoid refilled   Orders: -     Carisoprodol; 1 po qd pr  Dispense: 60 tablet; Refill: 1  Chronic pain syndrome -     HYDROcodone-Acetaminophen; Take 1 tablet by mouth every 6 (six) hours as needed for moderate pain.  Dispense: 30 tablet; Refill: 0 -  Drug Monitoring Panel [id number] , Urine  High risk medication use -     Drug Monitoring Panel [phone] , Urine  Primary hypertension Assessment & Plan: Well controlled, no changes to meds. Encouraged heart healthy diet such as the DASH diet and exercise as tolerated.       IAnn Held, DO, personally preformed the services described in this documentation.  All medical record entries made by the scribe were at my direction and in my presence.  I have reviewed the chart and discharge instructions (if applicable) and agree that the record reflects my personal performance and is accurate and complete. 11/11/2022  [place] as a scribe for Ann Held, DO.,have documented all relevant documentation on the  behalf of Ann Held, DO,as directed by  Ann Held, DO while in the presence of Ann Held, DO.

## 2022-11-11 NOTE — Assessment & Plan Note (Signed)
Encourage heart healthy diet such as MIND or DASH diet, increase exercise, avoid trans fats, simple carbohydrates and processed foods, consider a krill or fish or flaxseed oil cap daily.  °

## 2022-11-11 NOTE — Assessment & Plan Note (Signed)
Database reviewed  Uds and contract updated  Opoid refilled

## 2022-11-11 NOTE — Patient Instructions (Signed)

## 2022-11-11 NOTE — Assessment & Plan Note (Signed)
Well controlled, no changes to meds. Encouraged heart healthy diet such as the DASH diet and exercise as tolerated.  °

## 2022-11-13 LAB — DRUG MONITORING PANEL 376104, URINE
Amphetamines: NEGATIVE ng/mL (ref <500)
Barbiturates: NEGATIVE ng/mL (ref <300)
Benzodiazepines: NEGATIVE ng/mL (ref <100)
Cocaine Metabolite: NEGATIVE ng/mL (ref <150)
Desmethyltramadol: NEGATIVE ng/mL (ref <100)
Opiates: NEGATIVE ng/mL (ref <100)
Oxycodone: NEGATIVE ng/mL (ref <100)
Tramadol: NEGATIVE ng/mL (ref <100)

## 2022-11-13 LAB — DM TEMPLATE

## 2022-11-21 ENCOUNTER — Ambulatory Visit (HOSPITAL_COMMUNITY): Payer: No Typology Code available for payment source | Attending: Interventional Cardiology

## 2022-11-21 DIAGNOSIS — I350 Nonrheumatic aortic (valve) stenosis: Secondary | ICD-10-CM

## 2022-11-21 LAB — ECHOCARDIOGRAM COMPLETE
AR max vel: 1.36 cm2
AV Area VTI: 1.36 cm2
AV Area mean vel: 1.29 cm2
AV Mean grad: 15 mmHg
AV Peak grad: 29.9 mmHg
Ao pk vel: 2.74 m/s
Area-P 1/2: 6.12 cm2
S' Lateral: 2.2 cm

## 2022-11-28 ENCOUNTER — Ambulatory Visit: Payer: No Typology Code available for payment source | Admitting: Internal Medicine

## 2022-12-10 ENCOUNTER — Encounter: Payer: Self-pay | Admitting: Internal Medicine

## 2022-12-19 ENCOUNTER — Ambulatory Visit: Payer: No Typology Code available for payment source | Admitting: Internal Medicine

## 2023-01-13 ENCOUNTER — Other Ambulatory Visit: Payer: Self-pay | Admitting: *Deleted

## 2023-01-13 MED ORDER — LOSARTAN POTASSIUM-HCTZ 100-25 MG PO TABS: 1.0000 | ORAL_TABLET | Freq: Every day | ORAL | 3 refills | Status: DC

## 2023-02-12 ENCOUNTER — Other Ambulatory Visit: Payer: Self-pay | Admitting: Family Medicine

## 2023-04-04 ENCOUNTER — Encounter: Payer: Self-pay | Admitting: Family Medicine

## 2023-04-04 NOTE — Telephone Encounter (Signed)
Please advise 

## 2023-04-04 NOTE — Progress Notes (Signed)
Attempt to call for preop, left message for Pt to call Short stay Mon am for further instructions.

## 2023-04-06 NOTE — Anesthesia Preprocedure Evaluation (Signed)
Anesthesia Evaluation  Patient identified by MRN, date of birth, ID band Patient awake    Reviewed: Allergy & Precautions, NPO status , Patient's Chart, lab work & pertinent test results  History of Anesthesia Complications (+) PONV and history of anesthetic complications  Airway Mallampati: II  TM Distance: >3 FB Neck ROM: Full    Dental no notable dental hx. (+) Teeth Intact, Dental Advisory Given   Pulmonary    Pulmonary exam normal breath sounds clear to auscultation       Cardiovascular hypertension, Pt. on medications Normal cardiovascular exam Rhythm:Regular Rate:Normal     Neuro/Psych  Headaches    GI/Hepatic ,GERD  ,,  Endo/Other  negative endocrine ROS    Renal/GU      Musculoskeletal  (+) Arthritis , Osteoarthritis,    Abdominal   Peds  Hematology   Anesthesia Other Findings ALL : see list  Reproductive/Obstetrics                             Anesthesia Physical Anesthesia Plan  ASA: 2  Anesthesia Plan: General   Post-op Pain Management: Precedex and Toradol IV (intra-op)*   Induction: Intravenous  PONV Risk Score and Plan: 4 or greater and Treatment may vary due to age or medical condition, Ondansetron, Midazolam, Dexamethasone, TIVA, Propofol infusion and Scopolamine patch - Pre-op  Airway Management Planned: LMA  Additional Equipment: None  Intra-op Plan:   Post-operative Plan: Extubation in OR  Informed Consent: I have reviewed the patients History and Physical, chart, labs and discussed the procedure including the risks, benefits and alternatives for the proposed anesthesia with the patient or authorized representative who has indicated his/her understanding and acceptance.     Dental advisory given  Plan Discussed with: CRNA  Anesthesia Plan Comments: (TIVA LMA w Block )        Anesthesia Quick Evaluation

## 2023-04-07 ENCOUNTER — Other Ambulatory Visit: Payer: Self-pay | Admitting: Family Medicine

## 2023-04-07 ENCOUNTER — Ambulatory Visit (HOSPITAL_BASED_OUTPATIENT_CLINIC_OR_DEPARTMENT_OTHER): Payer: No Typology Code available for payment source | Admitting: Anesthesiology

## 2023-04-07 ENCOUNTER — Encounter (HOSPITAL_COMMUNITY): Payer: Self-pay | Admitting: Orthopedic Surgery

## 2023-04-07 ENCOUNTER — Other Ambulatory Visit: Payer: Self-pay

## 2023-04-07 ENCOUNTER — Ambulatory Visit (HOSPITAL_COMMUNITY)
Admission: RE | Admit: 2023-04-07 | Discharge: 2023-04-07 | Disposition: A | Discharge status: Home / Self Care | Payer: No Typology Code available for payment source | Attending: Orthopedic Surgery | Admitting: Orthopedic Surgery

## 2023-04-07 ENCOUNTER — Ambulatory Visit (HOSPITAL_COMMUNITY): Payer: No Typology Code available for payment source | Admitting: Anesthesiology

## 2023-04-07 ENCOUNTER — Encounter (HOSPITAL_COMMUNITY)
Admission: RE | Disposition: A | Discharge status: Home / Self Care | Payer: Self-pay | Source: Home / Self Care | Attending: Orthopedic Surgery

## 2023-04-07 DIAGNOSIS — X501XXA Overexertion from prolonged static or awkward postures, initial encounter: Secondary | ICD-10-CM | POA: Diagnosis not present

## 2023-04-07 DIAGNOSIS — S83242A Other tear of medial meniscus, current injury, left knee, initial encounter: Secondary | ICD-10-CM | POA: Diagnosis present

## 2023-04-07 DIAGNOSIS — I10 Essential (primary) hypertension: Secondary | ICD-10-CM | POA: Diagnosis not present

## 2023-04-07 DIAGNOSIS — S83249A Other tear of medial meniscus, current injury, unspecified knee, initial encounter: Secondary | ICD-10-CM | POA: Diagnosis present

## 2023-04-07 DIAGNOSIS — K219 Gastro-esophageal reflux disease without esophagitis: Secondary | ICD-10-CM | POA: Diagnosis not present

## 2023-04-07 DIAGNOSIS — G894 Chronic pain syndrome: Secondary | ICD-10-CM

## 2023-04-07 DIAGNOSIS — E785 Hyperlipidemia, unspecified: Secondary | ICD-10-CM

## 2023-04-07 HISTORY — PX: KNEE ARTHROSCOPY: SHX127

## 2023-04-07 HISTORY — DX: Nausea with vomiting, unspecified: R11.2

## 2023-04-07 HISTORY — PX: MENISCUS DEBRIDEMENT: SHX5178

## 2023-04-07 HISTORY — DX: Other specified postprocedural states: Z98.890

## 2023-04-07 LAB — CBC
HCT: 39.5 % (ref 36.0–46.0)
Hemoglobin: 12.1 g/dL (ref 12.0–15.0)
MCH: 27.4 pg (ref 26.0–34.0)
MCHC: 30.6 g/dL (ref 30.0–36.0)
MCV: 89.6 fL (ref 80.0–100.0)
Platelets: 180 10*3/uL (ref 150–400)
RBC: 4.41 MIL/uL (ref 3.87–5.11)
RDW: 12.2 % (ref 11.5–15.5)
WBC: 5.1 10*3/uL (ref 4.0–10.5)
nRBC: 0 % (ref 0.0–0.2)

## 2023-04-07 LAB — BASIC METABOLIC PANEL
Anion gap: 9 (ref 5–15)
BUN: 15 mg/dL (ref 8–23)
CO2: 25 mmol/L (ref 22–32)
Calcium: 9.5 mg/dL (ref 8.9–10.3)
Chloride: 107 mmol/L (ref 98–111)
Creatinine, Ser: 0.58 mg/dL (ref 0.44–1.00)
GFR, Estimated: >60 mL/min (ref >60)
Glucose, Bld: 95 mg/dL (ref 70–99)
Potassium: 3.2 mmol/L — ABNORMAL LOW (ref 3.5–5.1)
Sodium: 141 mmol/L (ref 135–145)

## 2023-04-07 SURGERY — ARTHROSCOPY, KNEE
Anesthesia: General | Site: Knee | Laterality: Left

## 2023-04-07 MED ORDER — MIDAZOLAM HCL 2 MG/2ML IJ SOLN
2.0000 mg | Freq: Once | INTRAMUSCULAR | Status: AC
  Administered 2023-04-07: 2 mg via INTRAVENOUS
  Filled 2023-04-07: qty 2

## 2023-04-07 MED ORDER — CHLORHEXIDINE GLUCONATE 0.12 % MT SOLN
15.0000 mL | Freq: Once | OROMUCOSAL | Status: AC
  Administered 2023-04-07: 15 mL via OROMUCOSAL

## 2023-04-07 MED ORDER — FENTANYL CITRATE PF 50 MCG/ML IJ SOSY
100.0000 ug | PREFILLED_SYRINGE | Freq: Once | INTRAMUSCULAR | Status: AC
  Administered 2023-04-07: 50 ug via INTRAVENOUS
  Filled 2023-04-07: qty 2

## 2023-04-07 MED ORDER — HYDROMORPHONE HCL 1 MG/ML IJ SOLN: 0.2500 mg | INTRAMUSCULAR | Status: DC | PRN

## 2023-04-07 MED ORDER — AMISULPRIDE (ANTIEMETIC) 5 MG/2ML IV SOLN: 10.0000 mg | Freq: Once | INTRAVENOUS | Status: DC | PRN

## 2023-04-07 MED ORDER — EPINEPHRINE PF 1 MG/ML IJ SOLN
INTRAMUSCULAR | Status: AC
  Filled 2023-04-07: qty 1

## 2023-04-07 MED ORDER — LACTATED RINGERS IV SOLN: INTRAVENOUS | Status: DC

## 2023-04-07 MED ORDER — LIDOCAINE 2% (20 MG/ML) 5 ML SYRINGE
INTRAMUSCULAR | Status: DC | PRN
  Administered 2023-04-07: 100 mg via INTRAVENOUS

## 2023-04-07 MED ORDER — ONDANSETRON HCL 4 MG/2ML IJ SOLN
INTRAMUSCULAR | Status: AC
  Filled 2023-04-07: qty 2

## 2023-04-07 MED ORDER — EPHEDRINE 5 MG/ML INJ
INTRAVENOUS | Status: AC
  Filled 2023-04-07: qty 5

## 2023-04-07 MED ORDER — HYDROMORPHONE HCL 1 MG/ML IJ SOLN
INTRAMUSCULAR | Status: AC
  Filled 2023-04-07: qty 1

## 2023-04-07 MED ORDER — FENTANYL CITRATE PF 50 MCG/ML IJ SOSY
25.0000 ug | PREFILLED_SYRINGE | INTRAMUSCULAR | Status: DC | PRN
  Administered 2023-04-07: 50 ug via INTRAVENOUS

## 2023-04-07 MED ORDER — CEFAZOLIN SODIUM-DEXTROSE 2-4 GM/100ML-% IV SOLN
2.0000 g | INTRAVENOUS | Status: AC
  Administered 2023-04-07: 2 g via INTRAVENOUS
  Filled 2023-04-07: qty 100

## 2023-04-07 MED ORDER — HYDROCODONE-ACETAMINOPHEN 5-325 MG PO TABS
1.0000 | ORAL_TABLET | Freq: Four times a day (QID) | ORAL | 0 refills | Status: DC | PRN
Start: 2023-04-07 — End: 2023-04-07

## 2023-04-07 MED ORDER — FENTANYL CITRATE (PF) 100 MCG/2ML IJ SOLN
INTRAMUSCULAR | Status: AC
  Filled 2023-04-07: qty 2

## 2023-04-07 MED ORDER — OXYCODONE HCL 5 MG PO TABS
ORAL_TABLET | ORAL | Status: AC
  Administered 2023-04-07: 5 mg via ORAL
  Filled 2023-04-07: qty 1

## 2023-04-07 MED ORDER — DEXAMETHASONE SODIUM PHOSPHATE 10 MG/ML IJ SOLN
INTRAMUSCULAR | Status: DC | PRN
  Administered 2023-04-07: 10 mg via INTRAVENOUS

## 2023-04-07 MED ORDER — FENTANYL CITRATE (PF) 100 MCG/2ML IJ SOLN
INTRAMUSCULAR | Status: DC | PRN
  Administered 2023-04-07 (×2): 25 ug via INTRAVENOUS

## 2023-04-07 MED ORDER — MIDAZOLAM HCL 2 MG/2ML IJ SOLN
INTRAMUSCULAR | Status: AC
  Filled 2023-04-07: qty 2

## 2023-04-07 MED ORDER — ACETAMINOPHEN 10 MG/ML IV SOLN
1000.0000 mg | Freq: Four times a day (QID) | INTRAVENOUS | Status: DC
  Administered 2023-04-07: 1000 mg via INTRAVENOUS
  Filled 2023-04-07: qty 100

## 2023-04-07 MED ORDER — FENTANYL CITRATE PF 50 MCG/ML IJ SOSY
PREFILLED_SYRINGE | INTRAMUSCULAR | Status: AC
  Administered 2023-04-07: 50 ug via INTRAVENOUS
  Filled 2023-04-07: qty 2

## 2023-04-07 MED ORDER — OXYCODONE HCL 5 MG/5ML PO SOLN: 5.0000 mg | Freq: Once | ORAL | Status: DC | PRN

## 2023-04-07 MED ORDER — BUPIVACAINE HCL (PF) 0.25 % IJ SOLN
INTRAMUSCULAR | Status: AC
  Filled 2023-04-07: qty 30

## 2023-04-07 MED ORDER — EPHEDRINE SULFATE-NACL 50-0.9 MG/10ML-% IV SOSY
PREFILLED_SYRINGE | INTRAVENOUS | Status: DC | PRN
  Administered 2023-04-07 (×2): 10 mg via INTRAVENOUS
  Administered 2023-04-07: 5 mg via INTRAVENOUS

## 2023-04-07 MED ORDER — DEXMEDETOMIDINE HCL IN NACL 80 MCG/20ML IV SOLN
INTRAVENOUS | Status: DC | PRN
  Administered 2023-04-07: 8 ug via INTRAVENOUS

## 2023-04-07 MED ORDER — ONDANSETRON HCL 4 MG/2ML IJ SOLN: 4.0000 mg | Freq: Once | INTRAMUSCULAR | Status: DC | PRN

## 2023-04-07 MED ORDER — OXYCODONE HCL 5 MG PO TABS: 5.0000 mg | ORAL_TABLET | Freq: Once | ORAL | Status: DC | PRN

## 2023-04-07 MED ORDER — ONDANSETRON HCL 4 MG/2ML IJ SOLN
INTRAMUSCULAR | Status: DC | PRN
Start: 2023-04-07 — End: 2023-04-07
  Administered 2023-04-07: 4 mg via INTRAVENOUS

## 2023-04-07 MED ORDER — ACETAMINOPHEN 10 MG/ML IV SOLN: 1000.0000 mg | Freq: Once | INTRAVENOUS | Status: DC | PRN

## 2023-04-07 MED ORDER — KETOROLAC TROMETHAMINE 30 MG/ML IJ SOLN
INTRAMUSCULAR | Status: AC
  Administered 2023-04-07: 30 mg via INTRAVENOUS
  Filled 2023-04-07: qty 1

## 2023-04-07 MED ORDER — ROPIVACAINE HCL 5 MG/ML IJ SOLN
INTRAMUSCULAR | Status: DC | PRN
  Administered 2023-04-07: 30 mL via PERINEURAL

## 2023-04-07 MED ORDER — LIDOCAINE HCL (PF) 2 % IJ SOLN
INTRAMUSCULAR | Status: AC
  Filled 2023-04-07: qty 5

## 2023-04-07 MED ORDER — PROPOFOL 10 MG/ML IV BOLUS
INTRAVENOUS | Status: DC | PRN
Start: 2023-04-07 — End: 2023-04-07
  Administered 2023-04-07: 150 mg via INTRAVENOUS

## 2023-04-07 MED ORDER — KETOROLAC TROMETHAMINE 30 MG/ML IJ SOLN: 30.0000 mg | Freq: Once | INTRAMUSCULAR | Status: AC

## 2023-04-07 MED ORDER — DEXAMETHASONE SODIUM PHOSPHATE 10 MG/ML IJ SOLN
INTRAMUSCULAR | Status: AC
  Filled 2023-04-07: qty 1

## 2023-04-07 MED ORDER — HYDROCODONE-ACETAMINOPHEN 5-325 MG PO TABS: 1.0000 | ORAL_TABLET | Freq: Four times a day (QID) | ORAL | 0 refills | Status: DC | PRN

## 2023-04-07 MED ORDER — DEXMEDETOMIDINE BOLUS VIA INFUSION
0.0890 ug/kg | Freq: Once | INTRAVENOUS | Status: DC
  Filled 2023-04-07: qty 9

## 2023-04-07 MED ORDER — CLONIDINE HCL (ANALGESIA) 100 MCG/ML EP SOLN
EPIDURAL | Status: DC | PRN
  Administered 2023-04-07: 100 ug

## 2023-04-07 MED ORDER — ORAL CARE MOUTH RINSE: 15.0000 mL | Freq: Once | OROMUCOSAL | Status: AC

## 2023-04-07 MED ORDER — BUPIVACAINE-EPINEPHRINE 0.25% -1:200000 IJ SOLN
INTRAMUSCULAR | Status: DC | PRN
  Administered 2023-04-07: 30 mL

## 2023-04-07 MED ORDER — DEXMEDETOMIDINE HCL IN NACL 80 MCG/20ML IV SOLN
INTRAVENOUS | Status: AC
  Filled 2023-04-07: qty 20

## 2023-04-07 SURGICAL SUPPLY — 34 items
BAG COUNTER SPONGE SURGICOUNT (BAG) IMPLANT
BAG SPNG CNTER NS LX DISP (BAG)
BLADE SHAVER TORPEDO 4X13 (MISCELLANEOUS) ×2 IMPLANT
BNDG CMPR 6 X 5 YARDS HK CLSR (GAUZE/BANDAGES/DRESSINGS) ×1
BNDG CMPR MED 10X6 ELC LF (GAUZE/BANDAGES/DRESSINGS) ×1
BNDG ELASTIC 6INX 5YD STR LF (GAUZE/BANDAGES/DRESSINGS) ×2 IMPLANT
BNDG ELASTIC 6X10 VLCR STRL LF (GAUZE/BANDAGES/DRESSINGS) IMPLANT
COVER SURGICAL LIGHT HANDLE (MISCELLANEOUS) ×2 IMPLANT
DRAPE ARTHROSCOPY W/POUCH 114 (DRAPES) ×2 IMPLANT
DRAPE U-SHAPE 47X51 STRL (DRAPES) ×2 IMPLANT
DRSG EMULSION OIL 3X16 NADH (GAUZE/BANDAGES/DRESSINGS) IMPLANT
DRSG EMULSION OIL 3X3 NADH (GAUZE/BANDAGES/DRESSINGS) ×2 IMPLANT
DURAPREP 26ML APPLICATOR (WOUND CARE) ×2 IMPLANT
GAUZE 4X4 16PLY ~~LOC~~+RFID DBL (SPONGE) ×2 IMPLANT
GAUZE PAD ABD 8X10 STRL (GAUZE/BANDAGES/DRESSINGS) ×2 IMPLANT
GAUZE SPONGE 4X4 12PLY STRL (GAUZE/BANDAGES/DRESSINGS) ×2 IMPLANT
GLOVE BIO SURGEON STRL SZ 6.5 (GLOVE) ×4 IMPLANT
GLOVE BIO SURGEON STRL SZ8 (GLOVE) ×4 IMPLANT
GLOVE BIOGEL PI IND STRL 7.0 (GLOVE) ×4 IMPLANT
GLOVE BIOGEL PI IND STRL 8 (GLOVE) ×2 IMPLANT
GLOVE SURG SS PI 7.0 STRL IVOR (GLOVE) ×2 IMPLANT
GOWN STRL REUS W/ TWL LRG LVL3 (GOWN DISPOSABLE) ×2 IMPLANT
GOWN STRL REUS W/TWL LRG LVL3 (GOWN DISPOSABLE) ×1
KIT BASIN OR (CUSTOM PROCEDURE TRAY) ×2 IMPLANT
MANIFOLD NEPTUNE II (INSTRUMENTS) ×2 IMPLANT
PACK ARTHROSCOPY WL (CUSTOM PROCEDURE TRAY) ×2 IMPLANT
PADDING CAST COTTON 6X4 STRL (CAST SUPPLIES) ×2 IMPLANT
PORT APPOLLO RF 90DEGREE MULTI (SURGICAL WAND) ×2 IMPLANT
PROTECTOR NERVE ULNAR (MISCELLANEOUS) ×2 IMPLANT
SUT ETHILON 4 0 PS 2 18 (SUTURE) ×2 IMPLANT
TOWEL OR 17X26 10 PK STRL BLUE (TOWEL DISPOSABLE) ×2 IMPLANT
TUBING ARTHROSCOPY IRRIG 16FT (MISCELLANEOUS) ×2 IMPLANT
TUBING CONNECTING 10 (TUBING) ×2 IMPLANT
WRAP KNEE MAXI GEL POST OP (GAUZE/BANDAGES/DRESSINGS) ×2 IMPLANT

## 2023-04-07 NOTE — Op Note (Signed)
Operative Report- KNEE ARTHROSCOPY  Preoperative diagnosis-  Left knee medial meniscal tear  Postoperative diagnosis Left- knee medial meniscal tear plus  chondral defect medial femoral condyle  Procedure- Left knee arthroscopy with medial  meniscal debridement and chondroplasty   Surgeon- Gus Rankin. Kari Kerth, MD  Anesthesia-General  EBL-  Minimal  Complications- None  Condition- PACU - hemodynamically stable.  Brief clinical note- -Yvonne Sanders is a 62 y.o.  female with a several month history of Left pain and mechanical symptoms. Exam and history suggested medial  meniscal tear confirmed by MRI. The patient presents now for arthroscopy and debridement   Procedure in detail -       After successful administration of General anesthetic, the Left lower extremity is prepped and draped in the usual sterile fashion. Time out is performed by the surgical team. Standard superomedial and inferolateral portal sites are marked and incisions made with an 11 blade. The inflow cannula is passed through the superomedial portal and camera through the inferolateral portal and inflow is initiated. Arthroscopic visualization proceeds.      The undersurface of the patella and trochlea are visualized and there is mild chondromalacia but no unstable defects. The medial and lateral gutters are visualized and there are no loose bodies. Flexion and valgus force is applied to the knee and the medial compartment is entered. A spinal needle is passed into the joint through the site marked for the inferomedial portal. A small incision is made and the dilator passed into the joint. The findings for the medial compartment are unstable tear posterior horn medial meniscus with 1 x 2 cm chondral defect medial femoral condyle . The tear is debrided to a stable base with baskets and a shaver and sealed off with the Arthrocare. The shaver is used to debride the unstable cartilage to a stable cartilaginous base with stable  edges. It is probed and found to be stable.    The intercondylar notch is visualized and the ACL appears normal . The lateral compartment is entered and the findings are normal .      The joint is again inspected and there are no other tears, defects or loose bodies identified. The arthroscopic equipment is then removed from the inferior portals which are closed with interrupted 4-0 nylon. 20 ml of .25% Marcaine with epinephrine are injected through the inflow cannula and the cannula is then removed and the portal closed with nylon. The incisions are cleaned and dried and a bulky sterile dressing is applied. The patient is then awakened and transported to recovery in stable condition.   04/07/2023, 2:50 PM

## 2023-04-07 NOTE — Anesthesia Postprocedure Evaluation (Signed)
Anesthesia Post Note  Patient: Neita Carp  Procedure(s) Performed: Left knee arthroscopy; medial meniscal debridement, chondroplasty (Left: Knee) DEBRIDEMENT OF MENISCUS (Left: Knee)     Patient location during evaluation: PACU Anesthesia Type: General Level of consciousness: awake and alert Pain management: pain level controlled Vital Signs Assessment: post-procedure vital signs reviewed and stable Respiratory status: spontaneous breathing, nonlabored ventilation, respiratory function stable and patient connected to nasal cannula oxygen Cardiovascular status: blood pressure returned to baseline and stable Postop Assessment: no apparent nausea or vomiting Anesthetic complications: no   No notable events documented.  Last Vitals:  Vitals:   04/07/23 1340 04/07/23 1630  BP: 116/75 (!) 143/95  Pulse: 70 71  Resp: 10 14  Temp:    SpO2: 100% 93%    Last Pain:  Vitals:   04/07/23 1630  TempSrc:   PainSc: 3                  Trevor Iha

## 2023-04-07 NOTE — Anesthesia Procedure Notes (Signed)
Procedure Name: LMA Insertion Date/Time: 04/07/2023 2:12 PM  Performed by: Pearson Grippe, CRNAPre-anesthesia Checklist: Patient identified, Emergency Drugs available, Suction available and Patient being monitored Patient Re-evaluated:Patient Re-evaluated prior to induction Oxygen Delivery Method: Circle system utilized Preoxygenation: Pre-oxygenation with 100% oxygen Induction Type: IV induction Ventilation: Mask ventilation without difficulty LMA: LMA inserted LMA Size: 4.0 Number of attempts: 1 Airway Equipment and Method: Bite block Placement Confirmation: positive ETCO2 Tube secured with: Tape Dental Injury: Teeth and Oropharynx as per pre-operative assessment

## 2023-04-07 NOTE — Discharge Instructions (Addendum)
 Dr. Frank Aluisio [facility] Specialist [facility] [address] [place], [place] [address] [phone]   Arthroscopic Procedure, Knee An arthroscopic procedure can find what is wrong with your knee. PROCEDURE Arthroscopy is a surgical technique that allows your orthopedic surgeon to diagnose and treat your knee injury with accuracy. They will look into your knee through a small instrument. This is almost like a small (pencil sized) telescope. Because arthroscopy affects your knee less than open knee surgery, you can anticipate a more rapid recovery. Taking an active role by following your caregiver's instructions will help with rapid and complete recovery. Use crutches, rest, elevation, ice, and knee exercises as instructed. The length of recovery depends on various factors including type of injury, age, physical condition, medical conditions, and your rehabilitation. Your knee is the joint between the large bones (femur and tibia) in your leg. Cartilage covers these bone ends which are smooth and slippery and allow your knee to bend and move smoothly. Two menisci, thick, semi-lunar shaped pads of cartilage which form a rim inside the joint, help absorb shock and stabilize your knee. Ligaments bind the bones together and support your knee joint. Muscles move the joint, help support your knee, and take stress off the joint itself. Because of this all programs and physical therapy to rehabilitate an injured or repaired knee require rebuilding and strengthening your muscles. AFTER THE PROCEDURE  After the procedure, you will be moved to a recovery area until most of the effects of the medication have worn off. Your caregiver will discuss the test results with you.   Only take over-the-counter or prescription medicines for pain, discomfort, or fever as directed by your caregiver.  SEEK MEDICAL CARE IF:   You have increased bleeding from your wounds.   You see redness,  swelling, or have increasing pain in your wounds.   You have pus coming from your wound.   You have an oral temperature above 102 F (38.9 C).   You notice a bad smell coming from the wound or dressing.   You have severe pain with any motion of your knee.  SEEK IMMEDIATE MEDICAL CARE IF:   You develop a rash.   You have difficulty breathing.   You have any allergic problems.  FURTHER INSTRUCTIONS:   ICE to the affected knee every three hours for 30 minutes at a time and then as needed for pain and swelling.  Continue to use ice on the knee for pain and swelling from surgery. You may notice swelling that will progress down to the foot and ankle.  This is normal after surgery.  Elevate the leg when you are not up walking on it.    DIET You may resume your previous home diet once your are discharged from the hospital.  DRESSING / WOUND CARE / SHOWERING You may shower 3 days after surgery, but keep the wounds dry during showering.  You may use an occlusive plastic wrap (Press'n Seal for example), NO SOAKING/SUBMERGING IN THE BATHTUB.  If the bandage gets wet, change with a clean dry gauze.  If the incision gets wet, pat the wound dry with a clean towel. You may start showering two days after being discharged home but do not submerge the incisions under water.  Change dressing 48 hours after the procedure and then cover the small incisions with band aids until your follow up visit. Change the surgical dressings daily and reapply a dry dressing each time.   ACTIVITY   Walk with your walker as instructed. Use walker as long as suggested by your caregivers. Avoid periods of inactivity such as sitting longer than an hour when not asleep. This helps prevent blood clots.  You may resume a sexual relationship in one month or when given the OK by your doctor.  You may return to work once you are cleared by your doctor.  Do not drive a car for 6 weeks or until released by you surgeon.  Do not  drive while taking narcotics.  WEIGHT BEARING Weight bearing as tolerated with assist device (walker, cane, etc) as directed, use it as long as suggested by your surgeon or therapist, typically at least 4-6 weeks.  POSTOPERATIVE CONSTIPATION PROTOCOL Constipation - defined medically as fewer than three stools per week and severe constipation as less than one stool per week.  One of the most common issues patients have following surgery is constipation.  Even if you have a regular bowel pattern at home, your normal regimen is likely to be disrupted due to multiple reasons following surgery.  Combination of anesthesia, postoperative narcotics, change in appetite and fluid intake all can affect your bowels.  In order to avoid complications following surgery, here are some recommendations in order to help you during your recovery period.  Colace (docusate) - Pick up an over-the-counter form of Colace or another stool softener and take twice a day as long as you are requiring postoperative pain medications.  Take with a full glass of water daily.  If you experience loose stools or diarrhea, hold the colace until you stool forms back up.  If your symptoms do not get better within 1 week or if they get worse, check with your doctor.  Dulcolax (bisacodyl) - Pick up over-the-counter and take as directed by the product packaging as needed to assist with the movement of your bowels.  Take with a full glass of water.  Use this product as needed if not relieved by Colace only.   MiraLax (polyethylene glycol) - Pick up over-the-counter to have on hand.  MiraLax is a solution that will increase the amount of water in your bowels to assist with bowel movements.  Take as directed and can mix with a glass of water, juice, soda, coffee, or tea.  Take if you go more than two days without a movement. Do not use MiraLax more than once per day. Call your doctor if you are still constipated or irregular after using this  medication for 7 days in a row.  If you continue to have problems with postoperative constipation, please contact the office for further assistance and recommendations.  If you experience "the worst abdominal pain ever" or develop nausea or vomiting, please contact the office immediatly for further recommendations for treatment.  ITCHING  If you experience itching with your medications, try taking only a single pain pill, or even half a pain pill at a time.  You can also use Benadryl over the counter for itching or also to help with sleep.   TED HOSE STOCKINGS Wear the elastic stockings on both legs for three weeks following surgery during the day but you may remove then at night for sleeping.  MEDICATIONS See your medication summary on the "After Visit Summary" that the nursing staff will review with you prior to discharge.  You may have some home medications which will be placed on hold until you complete the course of blood thinner medication.  It is important for you to complete the   blood thinner medication as prescribed by your surgeon.  Continue your approved medications as instructed at time of discharge. Do not drive while taking narcotics.   PRECAUTIONS If you experience chest pain or shortness of breath - call 911 immediately for transfer to the hospital emergency department.  If you develop a fever greater that 101 F, purulent drainage from wound, increased redness or drainage from wound, foul odor from the wound/dressing, or calf pain - CONTACT YOUR SURGEON.                                                   FOLLOW-UP APPOINTMENTS Make sure you keep all of your appointments after your operation with your surgeon and caregivers. You should call the office at [phone]  and make an appointment for approximately one week after the date of your surgery or on the date instructed by your surgeon outlined in the "After Visit Summary".  RANGE OF MOTION AND STRENGTHENING EXERCISES   Rehabilitation of the knee is important following a knee injury or an operation. After just a few days of immobilization, the muscles of the thigh which control the knee become weakened and shrink (atrophy). Knee exercises are designed to build up the tone and strength of the thigh muscles and to improve knee motion. Often times heat used for twenty to thirty minutes before working out will loosen up your tissues and help with improving the range of motion but do not use heat for the first two weeks following surgery. These exercises can be done on a training (exercise) mat, on the floor, on a table or on a bed. Use what ever works the best and is most comfortable for you Knee exercises include:  QUAD STRENGTHENING EXERCISES Strengthening Quadriceps Sets  Tighten muscles on top of thigh by pushing knees down into floor or table. Hold for 20 seconds. Repeat 10 times. Do 2 sessions per day.     Strengthening Terminal Knee Extension  With knee bent over bolster, straighten knee by tightening muscle on top of thigh. Be sure to keep bottom of knee on bolster. Hold for 20 seconds. Repeat 10 times. Do 2 sessions per day.   Straight Leg with Bent Knee  Lie on back with opposite leg bent. Keep involved knee slightly bent at knee and raise leg 4-6". Hold for 10 seconds. Repeat 20 times per set. Do 2 sets per session. Do 2 sessions per day.   

## 2023-04-07 NOTE — H&P (Signed)
CC- Yvonne Sanders is a 62 y.o. female who presents with left knee pain.  HPI- . Knee Pain: Patient presents with knee pain involving the  left knee. Onset of the symptoms was several months ago. Inciting event:  twisted and felt a pop left knee . Current symptoms include giving out, pain located medially, and swelling. Pain is aggravated by pivoting, rising after sitting, squatting, and walking.  Patient has had no prior knee problems. Evaluation to date: MRI: abnormal medial meniscal tear . Treatment to date: ice, OTC analgesics which are not very effective, and rest.  Past Medical History:  Diagnosis Date   Abnormal pap 1989   cryo   Arthritis    Cataract    Chicken pox    Endometriosis    Fibroid 1994   History of post uterine   Gallstones    GERD (gastroesophageal reflux disease)    Glaucoma    Headache(784.0)    Hyperlipidemia    Hypertension        Migraine    Positive TB test    Tibia fracture    Right leg.      Past Surgical History:  Procedure Laterality Date   APPENDECTOMY  79   BREAST EXCISIONAL BIOPSY Right    CHOLECYSTECTOMY  88   DIAGNOSTIC LAPAROSCOPY  88   endometrious   GLAUCOMA SURGERY Left 2019   LUMBAR FUSION  08/2011   l 3-4   LUMBAR FUSION     l 4-5   RIGHT OOPHORECTOMY  75    Prior to Admission medications   Medication Sig Start Date End Date Taking? Authorizing Provider  acetaminophen (TYLENOL) 500 MG tablet Take 1,000 mg by mouth daily.   Yes [provider]  b complex vitamins capsule Take 1 capsule by mouth 2 (two) times a week.   Yes [provider]  Biotin 5000 MCG CAPS Take 5,000 mcg by mouth daily.   Yes [provider]  Brinzolamide-Brimonidine 1-0.2 % SUSP Place 1 drop into both eyes 3 (three) times daily.  05/15/16  Yes [provider]  Calcium Carbonate Antacid (TUMS PO) Take 1 tablet by mouth daily as needed (heartburn).    Yes [provider]  carisoprodol (SOMA) 350 MG tablet 1 po qd  pr 11/11/22  Yes Lowne Florina Ou R, DO  Coenzyme Q10 100 MG TABS Take 100 mg by mouth daily.   Yes [provider]  cycloSPORINE (RESTASIS) 0.05 % ophthalmic emulsion Place 1 drop into both eyes 2 (two) times daily.   Yes [provider]  diclofenac Sodium (VOLTAREN) 1 % GEL Apply 4 g topically 4 (four) times daily. Patient taking differently: Apply 4 g topically daily as needed (pain). 05/02/22  Yes Seabron Spates R, DO  esomeprazole (NEXIUM) 40 MG capsule Take 1 capsule (40 mg total) by mouth daily. 02/12/23  Yes Lowne Chase, Yvonne R, DO  Evolocumab (REPATHA SURECLICK) 140 MG/ML SOAJ INJECT 1 ML INTO THE SKIN EVERY 14 DAYS 11/04/22  Yes Chandrasekhar, Mahesh A, MD  ezetimibe (ZETIA) 10 MG tablet Take 1 tablet (10 mg total) by mouth daily. 11/11/22  Yes Donato Schultz, DO  HYDROcodone-acetaminophen (NORCO/VICODIN) 5-325 MG tablet Take 1 tablet by mouth every 6 (six) hours as needed for moderate pain. 11/11/22  Yes Donato Schultz, DO  losartan-hydrochlorothiazide (HYZAAR) 100-25 MG tablet Take 1 tablet by mouth daily. 01/13/23  Yes Chandrasekhar, Mahesh A, MD  MAGNESIUM PO Take 420 mg by mouth daily.  Yes [provider]  metoprolol succinate (TOPROL-XL) 50 MG 24 hr tablet TAKE 1 TABLET BY MOUTH ONCE DAILY WITH  OR  IMMEDIATELY  FOLLOWING  A  MEAL 08/22/22  Yes Lyn Records, MD  Multiple Vitamins-Minerals (MULTIVITAMINS THER. W/MINERALS) TABS Take 1 tablet by mouth daily.   Yes [provider]  Netarsudil-Latanoprost 0.02-0.005 % SOLN Place 1 drop into both eyes at bedtime. 08/13/18  Yes Seabron Spates R, DO  rosuvastatin (CRESTOR) 40 MG tablet Take 1 tablet (40 mg total) by mouth daily. 11/11/22  Yes Seabron Spates R, DO   Knee exam soft tissue tenderness over medial joint line, no effusion, collateral ligaments intact  Physical Examination: General appearance - alert, well appearing, and in no distress Mental status - alert,  oriented to person, place, and time Chest - clear to auscultation, no wheezes, rales or rhonchi, symmetric air entry Heart - normal rate, regular rhythm, normal S1, S2, no murmurs, rubs, clicks or gallops Abdomen - soft, nontender, nondistended, no masses or organomegaly Neurological - alert, oriented, normal speech, no focal findings or movement disorder noted   Asessment/Plan--- Left knee medial meniscal tear- - Plan left knee arthroscopy with meniscal debridement. Procedure risks and potential comps discussed with patient who elects to proceed. Goals are decreased pain and increased function with a high likelihood of achieving both

## 2023-04-07 NOTE — Anesthesia Procedure Notes (Signed)
Anesthesia Regional Block: Adductor canal block   Pre-Anesthetic Checklist: , timeout performed,  Correct Patient, Correct Site, Correct Laterality,  Correct Procedure, Correct Position, site marked,  Risks and benefits discussed,  Surgical consent,  Pre-op evaluation,  At surgeon's request and post-op pain management  Laterality: Lower and Left  Prep: chloraprep       Needles:  Injection technique: Single-shot  Needle Type: Echogenic Needle     Needle Length: 9cm  Needle Gauge: 22     Additional Needles:   Procedures:,,,, ultrasound used (permanent image in chart),,    Narrative:  Start time: 04/07/2023 1:30 PM End time: 04/07/2023 1:40 PM Injection made incrementally with aspirations every 5 mL.  Performed by: Personally  Anesthesiologist: Trevor Iha, MD  Additional Notes: Block assessed prior to surgery. Pt tolerated procedure well.

## 2023-04-07 NOTE — Transfer of Care (Signed)
Immediate Anesthesia Transfer of Care Note  Patient: Yvonne Sanders  Procedure(s) Performed: Left knee arthroscopy; medial meniscal debridement, chondroplasty (Left: Knee) DEBRIDEMENT OF MENISCUS (Left: Knee)  Patient Location: PACU  Anesthesia Type:GA combined with regional for post-op pain  Level of Consciousness: awake, alert , and oriented  Airway & Oxygen Therapy: Patient Spontanous Breathing and Patient connected to face mask oxygen  Post-op Assessment: Report given to RN and Post -op Vital signs reviewed and stable  Post vital signs: Reviewed and stable  Last Vitals:  Vitals Value Taken Time  BP 108/65   Temp    Pulse 64   Resp 14   SpO2 99%     Last Pain:  Vitals:   04/07/23 1157  TempSrc: Oral  PainSc: 0-No pain         Complications: No notable events documented.

## 2023-04-08 ENCOUNTER — Encounter (HOSPITAL_COMMUNITY): Payer: Self-pay | Admitting: Orthopedic Surgery

## 2023-04-14 NOTE — Progress Notes (Unsigned)
Cardiology Office Note:    Date:  04/16/2023   ID:  Yvonne Sanders, DOB 1960/12/24, MRN [id number]  PCP:  Zola Button, Grayling Congress, DO  Cardiologist:  Christell Constant, MD   Referring MD: Zola Button, Grayling Congress, *   EX:[id number] to new cardiologist  History of Present Illness:    Yvonne Sanders is a 62 y.o. female with a hx of moderate  aortic valve stenosis, high risk coronary calcium score (597), family history of vascular disease and FHX end-stage kidney disease, essential hypertension, and LVH.  Last seen by Dr. Katrinka Blazing in 2023. 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  They are from [place])  Patient notes that she is doing well.   Since last visit notes she is doing well . Had meniscus repair surgery.   No chest pain or pressure .  No SOB/DOE and no PND/Orthopnea.  No weight gain or leg swelling.  No palpitations or syncope.  No rheumatic heart disease hx.  No FX of bicuspid AV.  No aortic dissection.  Brother had early CAD.   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      Past Surgical History:  Procedure Laterality Date   APPENDECTOMY  75   BREAST EXCISIONAL BIOPSY Right    CHOLECYSTECTOMY  88   DIAGNOSTIC LAPAROSCOPY  88   endometrious   GLAUCOMA SURGERY Left 2019   KNEE ARTHROSCOPY Left 04/07/2023   Procedure: Left knee arthroscopy; medial meniscal debridement, chondroplasty;  Surgeon: Ollen Gross, MD;  Location: [facility];  Service: Orthopedics;  Laterality: Left;    LUMBAR FUSION  08/2011   l 3-4    LUMBAR FUSION     l 4-5   MENISCUS DEBRIDEMENT Left 04/07/2023   Procedure: DEBRIDEMENT OF MENISCUS;  Surgeon: Ollen Gross, MD;  Location: [facility];  Service: Orthopedics;  Laterality: Left;   RIGHT OOPHORECTOMY  75    Current Medications: Current Meds  Medication Sig   acetaminophen (TYLENOL) 500 MG tablet Take 1,000 mg by mouth daily.   b complex vitamins capsule Take 1 capsule by mouth 2 (two) times a week.   Biotin 5000 MCG CAPS Take 5,000 mcg by mouth daily.   Brinzolamide-Brimonidine 1-0.2 % SUSP Place 1 drop into both eyes 3 (three) times daily.    Calcium Carbonate Antacid (TUMS PO) Take 1 tablet by mouth daily as needed (heartburn).    carisoprodol (SOMA) 350 MG tablet 1 po qd pr   Coenzyme Q10 100 MG TABS Take 100 mg by mouth daily.   cycloSPORINE (RESTASIS) 0.05 % ophthalmic emulsion Place 1 drop into both eyes 2 (two) times daily.   esomeprazole (NEXIUM) 40 MG capsule Take 1 capsule (40 mg total) by mouth daily.   Evolocumab (REPATHA SURECLICK) 140 MG/ML SOAJ INJECT 1 ML INTO THE SKIN EVERY 14 DAYS   ezetimibe (ZETIA) 10 MG  tablet Take 1 tablet (10 mg total) by mouth daily.   HYDROcodone-acetaminophen (NORCO/VICODIN) 5-325 MG tablet Take 1 tablet by mouth every 6 (six) hours as needed for moderate pain.   losartan-hydrochlorothiazide (HYZAAR) 100-25 MG tablet Take 1 tablet by mouth daily.   MAGNESIUM PO Take 420 mg by mouth daily.   methocarbamol (ROBAXIN) 500 MG tablet Take 500 mg by mouth every 6 (six) hours as needed.   metoprolol succinate (TOPROL-XL) 50 MG 24 hr tablet TAKE 1 TABLET BY MOUTH ONCE DAILY WITH  OR  IMMEDIATELY  FOLLOWING  A  MEAL   Multiple Vitamins-Minerals (MULTIVITAMINS THER. W/MINERALS) TABS Take 1 tablet by mouth daily.   Netarsudil-Latanoprost 0.02-0.005 % SOLN Place 1 drop into both eyes at bedtime.   rosuvastatin (CRESTOR) 40 MG tablet Take 1 tablet (40 mg total) by mouth daily.     Allergies:   Nubain [nalbuphine hcl], Nubain [nalbuphine hcl],  Corticosteroids, Dilaudid [hydromorphone hcl], Oxycodone-acetaminophen, Percodan [oxycodone-aspirin], and Tomato   Social History   Socioeconomic History   Marital status: Married    Spouse name: Not on file   Number of children: 1   Years of education: Not on file   Highest education level: Not on file  Occupational History   Occupation: Surgical coodinator  Tobacco Use   Smoking status: Never   Smokeless tobacco: Never  Vaping Use   Vaping status: Never Used  Substance and Sexual Activity   Alcohol use: Not Currently   Drug use: No   Sexual activity: Yes    Partners: Male    Birth control/protection: Post-menopausal  Other Topics Concern   Not on file  Social History Narrative   Not much exercise   Social Determinants of Health   Financial Resource Strain: Not on file  Food Insecurity: Not on file  Transportation Needs: Not on file  Physical Activity: Not on file  Stress: Not on file  Social Connections: Not on file     Family History: The patient's family history includes Breast cancer in her cousin, maternal aunt, and maternal grandmother; Diabetes in her maternal aunt and paternal aunt; Glaucoma in her father; Heart disease in her brother and father; Hyperlipidemia in her mother and sister; Hypertension in her brother, brother, father, mother, sister, and sister; Kidney disease in her father; Kidney failure in her father; Macular degeneration in her father; Prostate cancer in her father; Renal Disease in her father; Stroke in her father. There is no history of Colon cancer, Colon polyps, Stomach cancer, or Esophageal cancer.  ROS:   Please see the history of present illness.     EKGs/Labs/Other Studies Reviewed:    The following studies were reviewed today:  Cardiac Studies & Procedures       ECHOCARDIOGRAM  ECHOCARDIOGRAM COMPLETE 11/21/2022  Narrative ECHOCARDIOGRAM REPORT    Patient Name:   Yvonne Sanders Date of Exam: 11/21/2022 Medical Rec #:   [id number]        Height:       64.5 in Accession #:    [id number]       Weight:       206.6 lb Date of Birth:  Aug 21, 1961        BSA:          1.994 m Patient Age:    61 years         BP:           100/62 mmHg Patient Gender: F  HR:           57 bpm. Exam Location:  [address]  Procedure: 2D Echo, Cardiac Doppler and Color Doppler  Indications:    I35.0 Nonrheumatic aortic (valve) stenosis  History:        Patient has prior history of Echocardiogram examinations, most recent 12/20/2021. Risk Factors:Hypertension and Dyslipidemia. LVH. Obesity.  Sonographer:    Cathie Beams RCS Referring Phys: [phone] Barry Dienes [facility]  IMPRESSIONS   1. Left ventricular ejection fraction, by estimation, is 60 to 65%. The left ventricle has normal function. The left ventricle has no regional wall motion abnormalities. There is mild left ventricular hypertrophy. Left ventricular diastolic parameters were normal. 2. Right ventricular systolic function is normal. The right ventricular size is normal. 3. The mitral valve is normal in structure. Mild mitral valve regurgitation. No evidence of mitral stenosis. 4. The aortic valve has an indeterminant number of cusps. Aortic valve regurgitation is mild. Moderate aortic valve stenosis.  Comparison(s): No significant change from prior study.  FINDINGS Left Ventricle: Left ventricular ejection fraction, by estimation, is 60 to 65%. The left ventricle has normal function. The left ventricle has no regional wall motion abnormalities. The left ventricular internal cavity size was normal in size. There is mild left ventricular hypertrophy. Left ventricular diastolic parameters were normal.  Right Ventricle: The right ventricular size is normal. Right ventricular systolic function is normal.  Left Atrium: Left atrial size was normal in size.  Right Atrium: Right atrial size was normal in size.  Pericardium: There is no evidence of pericardial  effusion.  Mitral Valve: The mitral valve is normal in structure. Mild mitral valve regurgitation. No evidence of mitral valve stenosis.  Tricuspid Valve: The tricuspid valve is normal in structure. Tricuspid valve regurgitation is mild . No evidence of tricuspid stenosis.  Aortic Valve: The aortic valve has an indeterminant number of cusps. Aortic valve regurgitation is mild. Moderate aortic stenosis is present. Aortic valve mean gradient measures 15.0 mmHg. Aortic valve peak gradient measures 29.9 mmHg. Aortic valve area, by VTI measures 1.36 cm.  Pulmonic Valve: The pulmonic valve was normal in structure. Pulmonic valve regurgitation is trivial. No evidence of pulmonic stenosis.  Aorta: The aortic root is normal in size and structure.  Venous: The inferior vena cava was not well visualized.  IAS/Shunts: No atrial level shunt detected by color flow Doppler.   LEFT VENTRICLE PLAX 2D LVIDd:         3.30 cm   Diastology LVIDs:         2.20 cm   LV e' medial:    9.25 cm/s LV PW:         1.20 cm   LV E/e' medial:  10.6 LV IVS:        1.10 cm   LV e' lateral:   12.30 cm/s LVOT diam:     2.00 cm   LV E/e' lateral: 8.0 LV SV:         90 LV SV Index:   45 LVOT Area:     3.14 cm   RIGHT VENTRICLE RV Basal diam:  2.90 cm RV Mid diam:    2.70 cm RV S prime:     11.20 cm/s TAPSE (M-mode): 2.5 cm RVSP:           21.8 mmHg  LEFT ATRIUM             Index        RIGHT ATRIUM  Index LA diam:        3.80 cm 1.91 cm/m   RA Pressure: 3.00 mmHg LA Vol (A2C):   61.4 ml 30.79 ml/m  RA Area:     11.90 cm LA Vol (A4C):   38.5 ml 19.31 ml/m  RA Volume:   25.50 ml  12.79 ml/m LA Biplane Vol: 48.8 ml 24.47 ml/m AORTIC VALVE AV Area (Vmax):    1.36 cm AV Area (Vmean):   1.29 cm AV Area (VTI):     1.36 cm AV Vmax:           273.50 cm/s AV Vmean:          176.500 cm/s AV VTI:            0.665 m AV Peak Grad:      29.9 mmHg AV Mean Grad:      15.0 mmHg LVOT Vmax:          118.00 cm/s LVOT Vmean:        72.200 cm/s LVOT VTI:          0.287 m LVOT/AV VTI ratio: 0.43  AORTA Ao Root diam: 2.60 cm Ao Asc diam:  3.10 cm  MITRAL VALVE               TRICUSPID VALVE MV Area (PHT): 6.12 cm    TR Peak grad:   18.8 mmHg MV Decel Time: 124 msec    TR Vmax:        217.00 cm/s MV E velocity: 98.50 cm/s  Estimated RAP:  3.00 mmHg MV A velocity: 84.40 cm/s  RVSP:           21.8 mmHg MV E/A ratio:  1.17 SHUNTS Systemic VTI:  0.29 m Systemic Diam: 2.00 cm  Olga Millers MD Electronically signed by Olga Millers MD Signature Date/Time: 11/21/2022/3:11:41 PM    Final     CT SCANS  CT CARDIAC SCORING (SELF PAY ONLY) 10/19/2020  Addendum 10/19/2020  9:12 PM ADDENDUM REPORT: 10/19/2020 21:09  CLINICAL DATA:  Risk stratification  EXAM: Coronary Calcium Score  TECHNIQUE: The patient was scanned on a [employer] scanner. Axial non-contrast 3 mm slices were carried out through the heart. The data set was analyzed on a dedicated work station and scored using the Agatson method.  FINDINGS: Non-cardiac: See separate report from [facility].  Ascending Aorta: Severe calcifications of the aortic valve and aortic root. Normal caliber.  Pericardium: Normal.  Coronary arteries: Normal origin.  IMPRESSION: 1. Coronary calcium score of 597. This was 56 percentile for age and sex matched control.  2. Severe calcifications of the aortic valve (calcium score 3654) and of the aortic root. Consider an echocardiogram for evaluation of severity of aortic stenosis.   Electronically Signed By: Tobias Alexander On: 10/19/2020 21:09  Narrative EXAM: OVER-READ INTERPRETATION  CT CHEST  The following report is an over-read performed by radiologist Dr. Jeronimo Greaves of [facility] on 10/19/2020. This over-read does not include interpretation of cardiac or coronary anatomy or pathology. The calcium score interpretation by the cardiologist  is attached.  COMPARISON:  01/15/2016 plain film.  FINDINGS: Vascular: Hyperattenuation within the proximal ascending aorta is favored to be due to calcification rather than prior replacement. Mild descending thoracic calcified atherosclerosis.  Mediastinum/Nodes: No imaged thoracic adenopathy. Small to moderate hiatal hernia.  Lungs/Pleura: No pleural fluid. Left lower lobe calcified granuloma.  Upper Abdomen: Normal imaged portions of the liver, spleen.  Musculoskeletal: Midthoracic spondylosis.  IMPRESSION: 1.  No acute findings in the imaged extracardiac chest. 2.  Aortic Atherosclerosis (ICD10-I70.0). 3. Hiatal hernia.  Electronically Signed: By: Jeronimo Greaves M.D. On: 10/19/2020 16:44           Recent Labs: 11/11/2022: ALT 34 04/07/2023: BUN 15; Creatinine, Ser 0.58; Hemoglobin 12.1; Platelets 180; Potassium 3.2; Sodium 141  Recent Lipid Panel    Component Value Date/Time   CHOL 182 11/11/2022 0900   TRIG 95.0 11/11/2022 0900   HDL 70.90 11/11/2022 0900   CHOLHDL 3 11/11/2022 0900   VLDL 19.0 11/11/2022 0900   LDLCALC 92 11/11/2022 0900    Physical Exam:    VS:  BP (!) 100/58   Pulse (!) 56   Ht 5\' 5"  (1.651 m)   Wt 202 lb 12.8 oz (92 kg)   LMP 09/03/2007 (Approximate)   SpO2 99%   BMI 33.75 kg/m     Wt Readings from Last 3 Encounters:  04/16/23 202 lb 12.8 oz (92 kg)  04/07/23 198 lb 12.8 oz (90.2 kg)  11/11/22 206 lb 9.6 oz (93.7 kg)     GEN: No acute distress HEENT: Normal NECK: No JVD. CARDIAC: 2/6 right upper sternal systolic murmur, RRR  RESPIRATORY:  Clear to auscultation without rales, wheezing or rhonchi  ABDOMEN: Soft, non-tender, non-distended, No pulsatile mass, MUSCULOSKELETAL: No deformity  SKIN: Warm and dry NEUROLOGIC:  Alert and oriented x 3 PSYCHIATRIC:  Normal affect   ASSESSMENT:    1. Aortic stenosis, moderate   2. Coronary artery calcification   3. Mixed hyperlipidemia   4. Essential hypertension     PLAN:     Moderate AS - one year follow up, mechanism is unclear; no family hx of Bicuspid Aortic valve - low threshold for cardiac CT  CAC HLD - LDL is above goal on maximal tolerated therapy - continue, and will return to exercise post surgery  HTN - controlled on current therapy; average ambulatory BP 120/70; pt to call if symptomatic hypotension  March 2025 f/u after AS echo   Medication Adjustments/Labs and Tests Ordered: Current medicines are reviewed at length with the patient today.  Concerns regarding medicines are outlined above.  Orders Placed This Encounter  Procedures   ECHOCARDIOGRAM COMPLETE   No orders of the defined types were placed in this encounter.   Patient Instructions  Medication Instructions:  Your physician recommends that you continue on your current medications as directed. Please refer to the Current Medication list given to you today.  *If you need a refill on your cardiac medications before your next appointment, please call your pharmacy*   Lab Work: NONE  If you have labs (blood work) drawn today and your tests are completely normal, you will receive your results only by: MyChart Message (if you have MyChart) OR A paper copy in the mail If you have any lab test that is abnormal or we need to change your treatment, we will call you to review the results.   Testing/Procedures: Your physician has requested that you have an echocardiogram. Echocardiography is a painless test that uses sound waves to create images of your heart. It provides your doctor with information about the size and shape of your heart and how well your heart's chambers and valves are working. This procedure takes approximately one hour. There are no restrictions for this procedure. Please do NOT wear cologne, perfume, aftershave, or lotions (deodorant is allowed). Please arrive 15 minutes prior to your appointment time.    Follow-Up: At [facility],  you and your  health needs are our priority.  As part of our continuing mission to provide you with exceptional heart care, we have created designated Provider Care Teams.  These Care Teams include your primary Cardiologist (physician) and Advanced Practice Providers (APPs -  Physician Assistants and Nurse Practitioners) who all work together to provide you with the care you need, when you need it.     Your next appointment:   7 month(s) after March Echo   Provider:   Christell Constant, MD       Signed, Christell Constant, MD  04/16/2023 8:41 AM    [facility]

## 2023-04-16 ENCOUNTER — Ambulatory Visit: Payer: No Typology Code available for payment source | Attending: Internal Medicine | Admitting: Internal Medicine

## 2023-04-16 ENCOUNTER — Encounter: Payer: Self-pay | Admitting: Internal Medicine

## 2023-04-16 VITALS — BP 100/58 | HR 56 | Ht 65.0 in | Wt 202.8 lb

## 2023-04-16 DIAGNOSIS — I350 Nonrheumatic aortic (valve) stenosis: Secondary | ICD-10-CM | POA: Insufficient documentation

## 2023-04-16 DIAGNOSIS — I10 Essential (primary) hypertension: Secondary | ICD-10-CM | POA: Diagnosis not present

## 2023-04-16 DIAGNOSIS — I2510 Atherosclerotic heart disease of native coronary artery without angina pectoris: Secondary | ICD-10-CM | POA: Diagnosis not present

## 2023-04-16 DIAGNOSIS — E782 Mixed hyperlipidemia: Secondary | ICD-10-CM

## 2023-04-16 DIAGNOSIS — I2584 Coronary atherosclerosis due to calcified coronary lesion: Secondary | ICD-10-CM

## 2023-04-16 NOTE — Patient Instructions (Signed)
Medication Instructions:  Your physician recommends that you continue on your current medications as directed. Please refer to the Current Medication list given to you today.  *If you need a refill on your cardiac medications before your next appointment, please call your pharmacy*   Lab Work: NONE  If you have labs (blood work) drawn today and your tests are completely normal, you will receive your results only by: MyChart Message (if you have MyChart) OR A paper copy in the mail If you have any lab test that is abnormal or we need to change your treatment, we will call you to review the results.   Testing/Procedures: Your physician has requested that you have an echocardiogram. Echocardiography is a painless test that uses sound waves to create images of your heart. It provides your doctor with information about the size and shape of your heart and how well your heart's chambers and valves are working. This procedure takes approximately one hour. There are no restrictions for this procedure. Please do NOT wear cologne, perfume, aftershave, or lotions (deodorant is allowed). Please arrive 15 minutes prior to your appointment time.    Follow-Up: At [facility], you and your health needs are our priority.  As part of our continuing mission to provide you with exceptional heart care, we have created designated Provider Care Teams.  These Care Teams include your primary Cardiologist (physician) and Advanced Practice Providers (APPs -  Physician Assistants and Nurse Practitioners) who all work together to provide you with the care you need, when you need it.     Your next appointment:   7 month(s) after March Echo   Provider:   Christell Constant, MD

## 2023-04-17 ENCOUNTER — Encounter (INDEPENDENT_AMBULATORY_CARE_PROVIDER_SITE_OTHER): Payer: Self-pay

## 2023-05-12 ENCOUNTER — Ambulatory Visit (INDEPENDENT_AMBULATORY_CARE_PROVIDER_SITE_OTHER): Payer: No Typology Code available for payment source | Admitting: Family Medicine

## 2023-05-12 ENCOUNTER — Encounter: Payer: Self-pay | Admitting: Family Medicine

## 2023-05-12 VITALS — BP 110/70 | HR 61 | Temp 97.8°F | Resp 20 | Ht 65.0 in | Wt 208.6 lb

## 2023-05-12 DIAGNOSIS — Z23 Encounter for immunization: Secondary | ICD-10-CM

## 2023-05-12 DIAGNOSIS — Z9889 Other specified postprocedural states: Secondary | ICD-10-CM

## 2023-05-12 DIAGNOSIS — I10 Essential (primary) hypertension: Secondary | ICD-10-CM | POA: Diagnosis not present

## 2023-05-12 DIAGNOSIS — E785 Hyperlipidemia, unspecified: Secondary | ICD-10-CM | POA: Diagnosis not present

## 2023-05-12 LAB — COMPREHENSIVE METABOLIC PANEL
ALT: 67 U/L — ABNORMAL HIGH (ref 0–35)
AST: 39 U/L — ABNORMAL HIGH (ref 0–37)
Albumin: 4.1 g/dL (ref 3.5–5.2)
Alkaline Phosphatase: 76 U/L (ref 39–117)
BUN: 11 mg/dL (ref 6–23)
CO2: 30 meq/L (ref 19–32)
Calcium: 9.6 mg/dL (ref 8.4–10.5)
Chloride: 106 meq/L (ref 96–112)
Creatinine, Ser: 0.7 mg/dL (ref 0.40–1.20)
GFR: 92.98 mL/min (ref >60.00)
Glucose, Bld: 94 mg/dL (ref 70–99)
Potassium: 4.2 meq/L (ref 3.5–5.1)
Sodium: 142 meq/L (ref 135–145)
Total Bilirubin: 0.6 mg/dL (ref 0.2–1.2)
Total Protein: 7 g/dL (ref 6.0–8.3)

## 2023-05-12 LAB — LIPID PANEL
Cholesterol: 185 mg/dL (ref 0–200)
HDL: 81.5 mg/dL (ref >39.00)
LDL Cholesterol: 87 mg/dL (ref 0–99)
NonHDL: 103.82
Total CHOL/HDL Ratio: 2
Triglycerides: 85 mg/dL (ref 0.0–149.0)
VLDL: 17 mg/dL (ref 0.0–40.0)

## 2023-05-12 MED ORDER — MELOXICAM 15 MG PO TABS
15.0000 mg | ORAL_TABLET | Freq: Every day | ORAL | Status: DC
Start: 2023-05-12 — End: 2024-01-05

## 2023-05-12 MED ORDER — NONFORMULARY OR COMPOUNDED ITEM
Status: DC
Start: 2023-05-12 — End: 2024-01-05

## 2023-05-12 NOTE — Patient Instructions (Signed)

## 2023-05-12 NOTE — Progress Notes (Signed)
Established Patient Office Visit  Subjective   Patient ID: Yvonne Sanders, female    DOB: 1961/06/18  Age: 63 y.o. MRN: [id number]  Chief Complaint  Patient presents with   Hypertension   Hyperlipidemia   Follow-up    HPI Discussed the use of AI scribe software for clinical note transcription with the patient, who gave verbal consent to proceed.  History of Present Illness   The patient, with a history of arthritis, presents for a follow-up after a meniscus repair on August 5th. She reports that her knee is 'hurting' and 'aching a lot' today, especially after walking a significant distance for the first time since the surgery. She is using a cane for support. The patient was advised to start walking more by her orthopedic surgeon, Dr. Lequita Halt. She was recently started on meloxicam 15mg  daily for pain and inflammation, and has also been taking turmeric 1000mg  daily as a supplement. The patient has not started physical therapy due to ongoing inflammation. She has been managing the pain at home with ice packs.      Patient Active Problem List   Diagnosis Date Noted   Aortic stenosis, moderate 04/16/2023   Coronary artery calcification 04/16/2023   Acute medial meniscal tear 04/07/2023   Preventative health care 03/30/2021   Acute right ankle pain 01/03/2021   Mid-back pain, acute 01/03/2021   Numbness and tingling of right arm 01/03/2021   Chronic pain syndrome 08/21/2020   Chronic pain of both knees 08/21/2020   Gastroesophageal reflux disease 08/21/2020   Chest pain 08/21/2020   Migraine    Right anterior knee pain 05/15/2017   Plantar fasciitis 05/15/2017   Essential hypertension 04/11/2016   Hyperlipidemia 03/30/2015   GERD (gastroesophageal reflux disease) 03/30/2015   Elevated liver enzymes 03/30/2015   Posterior vitreous detachment 11/15/2014   LVH (left ventricular hypertrophy) 03/28/2014   Elevated blood pressure 03/28/2014   Heart murmur, systolic 03/28/2014    Glaucoma suspect 06/09/2012   Chronic glaucoma 06/09/2012   Primary open angle glaucoma 06/09/2012   Spondylosis of lumbar joint 08/29/2011   Synovial cyst of lumbar facet joint 08/29/2011   Lumbar foraminal stenosis 08/29/2011   Past Medical History:  Diagnosis Date   Abnormal pap 1989   cryo   Arthritis    Cataract    Chicken pox    Endometriosis    Fibroid 1994   History of post uterine   Gallstones    GERD (gastroesophageal reflux disease)    Glaucoma    Headache(784.0)    Hyperlipidemia    Hypertension        Migraine    PONV (postoperative nausea and vomiting)    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     Past Surgical History:  Procedure Laterality Date   APPENDECTOMY  75   BREAST EXCISIONAL BIOPSY Right    CHOLECYSTECTOMY  88   DIAGNOSTIC LAPAROSCOPY  88   endometrious   GLAUCOMA SURGERY Left 2019   KNEE ARTHROSCOPY Left 04/07/2023   Procedure: Left knee arthroscopy; medial meniscal debridement, chondroplasty;  Surgeon: Ollen Gross, MD;  Location: [facility];  Service: Orthopedics;  Laterality: Left;    LUMBAR FUSION  08/2011   l 3-4   LUMBAR FUSION     l 4-5   MENISCUS DEBRIDEMENT Left 04/07/2023   Procedure: DEBRIDEMENT OF MENISCUS;  Surgeon: Ollen Gross, MD;  Location: [facility];  Service: Orthopedics;  Laterality: Left;   RIGHT OOPHORECTOMY  75   Social History   Tobacco Use   Smoking status: Never   Smokeless tobacco: Never  Vaping Use   Vaping status: Never Used  Substance Use Topics   Alcohol use: Not Currently   Drug use: No   Social History   Socioeconomic History   Marital status: Married    Spouse name: Not on file   Number of children: 1   Years of education: Not on file   Highest education level: Not on file  Occupational History   Occupation: Surgical coodinator  Tobacco Use   Smoking status: Never   Smokeless tobacco: Never  Vaping Use    Vaping status: Never Used  Substance and Sexual Activity   Alcohol use: Not Currently   Drug use: No   Sexual activity: Yes    Partners: Male    Birth control/protection: Post-menopausal  Other Topics Concern   Not on file  Social History Narrative   Not much exercise   Social Determinants of Health   Financial Resource Strain: Not on file  Food Insecurity: Not on file  Transportation Needs: Not on file  Physical Activity: Not on file  Stress: Not on file  Social Connections: Not on file  Intimate Partner Violence: Not on file   Family Status  Relation Name Status   Mother  Deceased   Father  Deceased at age 26   Sister  (Not Specified)   Sister  (Not Specified)   Brother  (Not Specified)   Brother  (Not Specified)   MGM  Deceased   MGF  Deceased   PGM  Deceased   PGF  Deceased   Son  Chemical engineer  (Not Specified)   Mat Aunt  (Not Specified)   Oceanographer  (Not Specified)   Cousin  (Not Specified)   Neg Hx  (Not Specified)  No partnership data on file   Family History  Problem Relation Age of Onset   Hyperlipidemia Mother    Hypertension Mother    Hypertension Father    Glaucoma Father    Prostate cancer Father    Renal Disease Father    Macular degeneration Father    Stroke Father    Kidney disease Father    Heart disease Father    Kidney failure Father    Hypertension Sister    Hyperlipidemia Sister    Hypertension Sister    Hypertension Brother    Heart disease Brother    Hypertension Brother    Breast cancer Maternal Grandmother    Breast cancer Maternal Aunt    Diabetes Maternal Aunt    Diabetes Paternal Aunt    Breast cancer Cousin    Colon cancer Neg Hx    Colon polyps Neg Hx    Stomach cancer Neg Hx    Esophageal cancer Neg Hx    Allergies  Allergen Reactions   Nubain [Nalbuphine Hcl] Anaphylaxis   Nubain [Nalbuphine Hcl] Nausea And Vomiting   Corticosteroids Other (See Comments)    Due to glaucoma can not have steroids   Dilaudid  [Hydromorphone Hcl] Nausea And Vomiting    Severe itching   Oxycodone-Acetaminophen Itching   Percodan [Oxycodone-Aspirin] Hives and Itching   Tomato Rash    Rash, spots on tongue      Review of Systems  Constitutional:  Negative for chills, fever and malaise/fatigue.  HENT:  Negative for congestion and hearing  loss.   Eyes:  Negative for blurred vision and discharge.  Respiratory:  Negative for cough, sputum production and shortness of breath.   Cardiovascular:  Negative for chest pain, palpitations and leg swelling.  Gastrointestinal:  Negative for abdominal pain, blood in stool, constipation, diarrhea, heartburn, nausea and vomiting.  Genitourinary:  Negative for dysuria, frequency, hematuria and urgency.  Musculoskeletal:  Negative for back pain, falls and myalgias.  Skin:  Negative for rash.  Neurological:  Positive for weakness. Negative for dizziness, sensory change, loss of consciousness and headaches.  Endo/Heme/Allergies:  Negative for environmental allergies. Does not bruise/bleed easily.  Psychiatric/Behavioral:  Negative for depression and suicidal ideas. The patient is not nervous/anxious and does not have insomnia.       Objective:     BP 110/70 (BP Location: Left Arm, Patient Position: Sitting, Cuff Size: Large)   Pulse 61   Temp 97.8 F (36.6 C) (Oral)   Resp 20   Ht 5\' 5"  (1.651 m)   Wt 208 lb 9.6 oz (94.6 kg)   LMP 09/03/2007 (Approximate)   SpO2 97%   BMI 34.71 kg/m  BP Readings from Last 3 Encounters:  05/12/23 110/70  04/16/23 (!) 100/58  04/07/23 (!) 143/95   Wt Readings from Last 3 Encounters:  05/12/23 208 lb 9.6 oz (94.6 kg)  04/16/23 202 lb 12.8 oz (92 kg)  04/07/23 198 lb 12.8 oz (90.2 kg)   SpO2 Readings from Last 3 Encounters:  05/12/23 97%  04/16/23 99%  04/07/23 93%      Physical Exam Vitals and nursing note reviewed.  Constitutional:      General: She is not in acute distress.    Appearance: Normal appearance. She is  well-developed.  HENT:     Head: Normocephalic and atraumatic.  Eyes:     General: No scleral icterus.       Right eye: No discharge.        Left eye: No discharge.  Cardiovascular:     Rate and Rhythm: Normal rate and regular rhythm.     Heart sounds: No murmur heard. Pulmonary:     Effort: Pulmonary effort is normal. No respiratory distress.     Breath sounds: Normal breath sounds.  Musculoskeletal:        General: Normal range of motion.     Cervical back: Normal range of motion and neck supple.     Right lower leg: No edema.     Left lower leg: No edema.  Skin:    General: Skin is warm and dry.  Neurological:     Mental Status: She is alert and oriented to person, place, and time.     Motor: Weakness present.     Gait: Gait abnormal.  Psychiatric:        Mood and Affect: Mood normal.        Behavior: Behavior normal.        Thought Content: Thought content normal.        Judgment: Judgment normal.      No results found for any visits on 05/12/23.  Last CBC Lab Results  Component Value Date   WBC 5.1 04/07/2023   HGB 12.1 04/07/2023   HCT 39.5 04/07/2023   MCV 89.6 04/07/2023   MCH 27.4 04/07/2023   RDW 12.2 04/07/2023   PLT 180 04/07/2023   Last metabolic panel Lab Results  Component Value Date   GLUCOSE 95 04/07/2023   NA 141 04/07/2023   K 3.2 (L) 04/07/2023  CL 107 04/07/2023   CO2 25 04/07/2023   BUN 15 04/07/2023   CREATININE 0.58 04/07/2023   GFRNONAA >60 04/07/2023   CALCIUM 9.5 04/07/2023   PHOS 3.6 02/28/2020   PROT 6.9 11/11/2022   ALBUMIN 4.1 11/11/2022   BILITOT 0.4 11/11/2022   ALKPHOS 78 11/11/2022   AST 25 11/11/2022   ALT 34 11/11/2022   ANIONGAP 9 04/07/2023   Last lipids Lab Results  Component Value Date   CHOL 182 11/11/2022   HDL 70.90 11/11/2022   LDLCALC 92 11/11/2022   TRIG 95.0 11/11/2022   CHOLHDL 3 11/11/2022   Last hemoglobin A1c Lab Results  Component Value Date   HGBA1C 5.1 03/30/2021   Last thyroid  functions Lab Results  Component Value Date   TSH 0.74 04/02/2022  Last vitamin D No results found for: "25OHVITD2", "25OHVITD3", "VD25OH" Last vitamin B12 and Folate Lab Results  Component Value Date   VITAMINB12 1,505 (H) 08/21/2020      The 10-year ASCVD risk score (Arnett DK, et al., 2019) is: 8.9%    Assessment & Plan:   Problem List Items Addressed This Visit       Unprioritized   Hyperlipidemia - Primary    Tolerating statin, encouraged heart healthy diet, avoid trans fats, minimize simple carbs and saturated fats. Increase exercise as tolerated       Relevant Orders   Comprehensive metabolic panel   Lipid panel   Essential hypertension    Well controlled, no changes  to meds. Encouraged heart healthy diet such as the DASH diet and exercise as tolerated.        Other Visit Diagnoses     Need for influenza vaccination       Relevant Orders   Flu vaccine trivalent PF, 6mos and older(Flulaval,Afluria,Fluarix,Fluzone) (Completed)   Primary hypertension       Relevant Orders   Comprehensive metabolic panel   Lipid panel   S/P knee surgery       Relevant Medications   meloxicam (MOBIC) 15 MG tablet   NONFORMULARY OR COMPOUNDED ITEM     Assessment and Plan    Post-operative Meniscus Repair Pain and spasms reported during first significant ambulation post-surgery. No physical therapy initiated due to inflammation. Meloxicam 15mg  daily and turmeric 1000mg  daily added for pain and inflammation management. -Continue Meloxicam 15mg  daily and turmeric 1000mg  daily. -Gradually increase ambulation as tolerated. -Consider physical therapy when inflammation subsides.  Hyperlipidemia On Rosuvastatin, no current concerns. -Continue Rosuvastatin as prescribed. -Check cholesterol and liver function today.  Gastroesophageal Reflux Disease On Nexium, no current concerns. -Continue Nexium as prescribed.  General Health Maintenance -Received flu shot today. -Check thyroid function today.        Return in about 6 months (around 11/09/2023), or if symptoms worsen or fail to improve, for annual exam, fasting.    Donato Schultz, DO

## 2023-05-12 NOTE — Assessment & Plan Note (Signed)
Tolerating statin, encouraged heart healthy diet, avoid trans fats, minimize simple carbs and saturated fats. Increase exercise as tolerated 

## 2023-05-12 NOTE — Assessment & Plan Note (Signed)
Well controlled, no changes to meds. Encouraged heart healthy diet such as the DASH diet and exercise as tolerated.  °

## 2023-05-14 ENCOUNTER — Other Ambulatory Visit: Payer: Self-pay | Admitting: Family Medicine

## 2023-05-14 DIAGNOSIS — R748 Abnormal levels of other serum enzymes: Secondary | ICD-10-CM

## 2023-06-10 ENCOUNTER — Emergency Department (HOSPITAL_COMMUNITY)
Admission: EM | Admit: 2023-06-10 | Discharge: 2023-06-10 | Disposition: A | Discharge status: Home / Self Care | Payer: Worker's Compensation | Attending: Emergency Medicine | Admitting: Emergency Medicine

## 2023-06-10 ENCOUNTER — Other Ambulatory Visit: Payer: Self-pay

## 2023-06-10 ENCOUNTER — Emergency Department (HOSPITAL_COMMUNITY): Payer: No Typology Code available for payment source

## 2023-06-10 ENCOUNTER — Encounter (HOSPITAL_COMMUNITY): Payer: Self-pay

## 2023-06-10 DIAGNOSIS — S060X0A Concussion without loss of consciousness, initial encounter: Secondary | ICD-10-CM | POA: Diagnosis not present

## 2023-06-10 DIAGNOSIS — Z026 Encounter for examination for insurance purposes: Secondary | ICD-10-CM | POA: Diagnosis not present

## 2023-06-10 DIAGNOSIS — W208XXA Other cause of strike by thrown, projected or falling object, initial encounter: Secondary | ICD-10-CM | POA: Insufficient documentation

## 2023-06-10 DIAGNOSIS — Y990 Civilian activity done for income or pay: Secondary | ICD-10-CM | POA: Insufficient documentation

## 2023-06-10 DIAGNOSIS — S0083XA Contusion of other part of head, initial encounter: Secondary | ICD-10-CM | POA: Diagnosis not present

## 2023-06-10 DIAGNOSIS — S0990XA Unspecified injury of head, initial encounter: Secondary | ICD-10-CM | POA: Diagnosis present

## 2023-06-10 MED ORDER — ONDANSETRON HCL 4 MG/2ML IJ SOLN
4.0000 mg | Freq: Once | INTRAMUSCULAR | Status: AC
  Administered 2023-06-10: 4 mg via INTRAVENOUS
  Filled 2023-06-10: qty 2

## 2023-06-10 MED ORDER — ONDANSETRON HCL 4 MG PO TABS: 4.0000 mg | ORAL_TABLET | Freq: Four times a day (QID) | ORAL | 0 refills | Status: DC

## 2023-06-10 MED ORDER — MORPHINE SULFATE (PF) 4 MG/ML IV SOLN
4.0000 mg | Freq: Once | INTRAVENOUS | Status: AC
  Administered 2023-06-10: 4 mg via INTRAVENOUS
  Filled 2023-06-10: qty 1

## 2023-06-10 MED ORDER — FENTANYL CITRATE PF 50 MCG/ML IJ SOSY
50.0000 ug | PREFILLED_SYRINGE | Freq: Once | INTRAMUSCULAR | Status: AC
  Administered 2023-06-10: 50 ug via INTRAVENOUS
  Filled 2023-06-10: qty 1

## 2023-06-10 MED ORDER — IBUPROFEN 200 MG PO TABS
600.0000 mg | ORAL_TABLET | Freq: Once | ORAL | Status: DC
  Filled 2023-06-10: qty 3

## 2023-06-10 MED ORDER — IBUPROFEN 600 MG PO TABS: 600.0000 mg | ORAL_TABLET | Freq: Four times a day (QID) | ORAL | 0 refills | Status: DC | PRN

## 2023-06-10 MED ORDER — METOCLOPRAMIDE HCL 5 MG/ML IJ SOLN
10.0000 mg | Freq: Once | INTRAMUSCULAR | Status: AC
  Administered 2023-06-10: 10 mg via INTRAVENOUS
  Filled 2023-06-10: qty 2

## 2023-06-10 MED ORDER — ACETAMINOPHEN 500 MG PO TABS: 500.0000 mg | ORAL_TABLET | Freq: Four times a day (QID) | ORAL | 0 refills | Status: AC | PRN

## 2023-06-10 NOTE — ED Triage Notes (Signed)
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 Hit under her right eye. Complaining of a headache and blurred vision. Takes aspirin daily. No LOC.

## 2023-06-10 NOTE — Discharge Instructions (Signed)
Please take the medicines prescribed. You might have a mild concussion, and symptoms should improve over time. If symptoms not better in a week, please call PCP.

## 2023-06-10 NOTE — ED Provider Notes (Signed)
[place] EMERGENCY DEPARTMENT AT [facility] Provider Note   CSN: [id number] Arrival date & time: 06/10/23  1017     History  Chief Complaint  Patient presents with   Head Injury    Yvonne Sanders is a 62 y.o. female.  62 year old female comes in a chief complaint of head injury.  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  She states that she is doing at work, when the overhead cabinet fell onto her.  In the process of having and struck the right side of her head and face.  Patient has headache, nausea.  She denies loss of consciousness.  Patient has subtle discomfort with movement of her eye, but no eye pain itself.  She denies any loss of vision, but thinks that the vision is slightly off.  She is certain that the cabinet did not strike her eye.  Review of system is negative for loss of consciousness, memory loss/amnesia, vision loss.  Review of system is positive for photophobia, nausea.  Patient did try to hold a cabinet and has mild shoulder discomfort.   Home Medications Prior to Admission medications   Medication Sig Start Date End Date Taking? Authorizing Provider  acetaminophen (TYLENOL) 500 MG tablet Take 1 tablet (500 mg total) by mouth every 6 (six) hours as needed. 06/10/23  Yes Izabela Ow, MD  ondansetron (ZOFRAN) 4 MG tablet Take 1 tablet (4 mg total) by mouth every 6 (six) hours. 06/10/23  Yes Derwood Kaplan, MD  b complex vitamins capsule Take 1 capsule by mouth 2 (two) times a week.    [provider]  Biotin 5000 MCG CAPS Take 5,000 mcg by mouth daily.    [provider]  Brinzolamide-Brimonidine 1-0.2 % SUSP Place 1 drop into both eyes 3 (three) times daily.  05/15/16   [provider]  Calcium Carbonate Antacid (TUMS PO) Take 1 tablet by mouth daily as needed (heartburn).     [provider]  carisoprodol (SOMA) 350 MG tablet 1 po qd pr 11/11/22   Zola Button, Grayling Congress, DO  Coenzyme Q10 100 MG TABS Take 100 mg by mouth  daily.    [provider]  cycloSPORINE (RESTASIS) 0.05 % ophthalmic emulsion Place 1 drop into both eyes 2 (two) times daily.    [provider]  esomeprazole (NEXIUM) 40 MG capsule Take 1 capsule (40 mg total) by mouth daily. 02/12/23   Lowne Chase, Yvonne R, DO  Evolocumab (REPATHA SURECLICK) 140 MG/ML SOAJ INJECT 1 ML INTO THE SKIN EVERY 14 DAYS 11/04/22   Chandrasekhar, Mahesh A, MD  ezetimibe (ZETIA) 10 MG tablet Take 1 tablet (10 mg total) by mouth daily. 11/11/22   Donato Schultz, DO  HYDROcodone-acetaminophen (NORCO/VICODIN) 5-325 MG tablet Take 1 tablet by mouth every 6 (six) hours as needed for moderate pain. 04/07/23   Donato Schultz, DO  losartan-hydrochlorothiazide (HYZAAR) 100-25 MG tablet Take 1 tablet by mouth daily. 01/13/23   Chandrasekhar, Lafayette Dragon A, MD  MAGNESIUM PO Take 420 mg by mouth daily.    [provider]  meloxicam (MOBIC) 15 MG tablet Take 1 tablet (15 mg total) by mouth daily. 05/12/23   Donato Schultz, DO  methocarbamol (ROBAXIN) 500 MG tablet Take 500 mg by mouth every 6 (six) hours as needed. 04/04/23   [provider]  metoprolol succinate (TOPROL-XL) 50 MG 24 hr tablet TAKE 1 TABLET BY MOUTH ONCE DAILY WITH  OR  IMMEDIATELY  FOLLOWING  A  MEAL 08/22/22   Lyn Records, MD  Multiple Vitamins-Minerals (MULTIVITAMINS THER. W/MINERALS) TABS Take 1 tablet by mouth daily.    [provider]  Netarsudil-Latanoprost 0.02-0.005 % SOLN Place 1 drop into both eyes at bedtime. 08/13/18   Seabron Spates R, DO  NONFORMULARY OR COMPOUNDED ITEM Tumeric 1000mg  daily 05/12/23   Zola Button, Grayling Congress, DO  rosuvastatin (CRESTOR) 40 MG tablet Take 1 tablet (40 mg total) by mouth daily. 11/11/22   Donato Schultz, DO      Allergies    Nubain [nalbuphine hcl], Nubain [nalbuphine hcl], Corticosteroids, Dilaudid [hydromorphone hcl], Oxycodone-acetaminophen, Percodan [oxycodone-aspirin], and Tomato    Review of Systems    Review of Systems  All other systems reviewed and are negative.   Physical Exam Updated Vital Signs BP (!) 119/94   Pulse (!) 59   Temp 98.6 F (37 C) (Oral)   Resp 17   Ht 5\' 5"  (1.651 m)   Wt 90.7 kg   LMP 09/03/2007 (Approximate)   SpO2 97%   BMI 33.28 kg/m  Physical Exam Vitals and nursing note reviewed.  Constitutional:      Appearance: She is well-developed.  HENT:     Head:     Comments: Patient has right-sided facial ecchymosis lateral and superior to the right eye and over the right medication.. Eyes:     Extraocular Movements: Extraocular movements intact.     Comments: No consensual photophobia, no direct photophobia. Peripheral visual fields intact, patient is able to finger count without difficulty Mild chemosis of the right conjunctiva noted  Neck:     Comments: No midline c-spine tenderness, pt able to turn head to 45 degrees bilaterally without any pain and able to flex neck to the chest and extend without any pain or neurologic symptoms.  Cardiovascular:     Rate and Rhythm: Normal rate.  Pulmonary:     Effort: Pulmonary effort is normal.  Musculoskeletal:        General: No deformity.     Cervical back: Normal range of motion and neck supple.  Skin:    General: Skin is warm and dry.  Neurological:     Mental Status: She is alert and oriented to person, place, and time.     ED Results / Procedures / Treatments   Labs (all labs ordered are listed, but only abnormal results are displayed) Labs Reviewed - No data to display  EKG None  Radiology CT Maxillofacial Wo Contrast  Result Date: 06/10/2023 CLINICAL DATA:  62 year old female status post blunt trauma, struck on head/right eye with metal board. Headache and blurred vision. EXAM: CT MAXILLOFACIAL WITHOUT CONTRAST TECHNIQUE: Multidetector CT imaging of the maxillofacial structures was performed. Multiplanar CT image reconstructions were also generated. RADIATION DOSE REDUCTION: This exam was  performed according to the departmental dose-optimization program which includes automated exposure control, adjustment of the mA and/or kV according to patient size and/or use of iterative reconstruction technique. COMPARISON:  Head CT today.  Orbit MRI 09/12/2010. FINDINGS: Osseous: Or mandible intact and normally located. No acute dental finding. Bilateral maxilla, zygoma, pterygoid, and nasal bones appear intact. Central skull base and visible cervical vertebrae appear intact and aligned. Widespread advanced cervical disc and endplate degeneration incidentally noted. Orbits: No orbital wall fracture. There is right inferolateral periorbital soft tissue swelling and stranding on series 4, image 21. The right globe and intraorbital soft tissues remain normal. No soft tissue gas identified. Underlying right zygomaticomaxillary bony confluence is intact. Left  orbits soft tissues are normal. Sinuses: Clear throughout. Soft tissues: Negative visible noncontrast thyroid, larynx, pharynx, parapharyngeal spaces, retropharyngeal space, sublingual space, submandibular spaces, masticator and parotid spaces. Visible cervical lymph nodes appear normal. Limited intracranial: Reported separately. IMPRESSION: 1. Right periorbital soft tissue swelling with no facial fracture or orbital injury identified. 2. Widespread advanced cervical spine degeneration incidentally noted. Electronically Signed   By: Odessa Fleming M.D.   On: 06/10/2023 13:07   CT Head Wo Contrast  Result Date: 06/10/2023 CLINICAL DATA:  62 year old female status post blunt trauma, struck on head/right eye with metal board. Headache and blurred vision. EXAM: CT HEAD WITHOUT CONTRAST TECHNIQUE: Contiguous axial images were obtained from the base of the skull through the vertex without intravenous contrast. RADIATION DOSE REDUCTION: This exam was performed according to the departmental dose-optimization program which includes automated exposure control, adjustment of  the mA and/or kV according to patient size and/or use of iterative reconstruction technique. COMPARISON:  Face CT today reported separately. Prior head CT 11/21/2003, orbit MRI 09/12/2010. FINDINGS: Brain: Cerebral volume remains normal for age. No midline shift, ventriculomegaly, mass effect, evidence of mass lesion, intracranial hemorrhage or evidence of cortically based acute infarction. Gray-white matter differentiation is within normal limits throughout the brain. Vascular: No suspicious intracranial vascular hyperdensity. Calcified atherosclerosis at the skull base. Skull: Chronic and benign left posterior skull exostosis on series 5, image 41, present in 2005 and mildly larger since that time. No skull fracture identified. Face is detailed separately. Sinuses/Orbits: Visualized paranasal sinuses and mastoids are stable and well aerated. Other: Visualized scalp soft tissues are within normal limits. Orbits are detailed on the face CT separately. IMPRESSION: 1. Normal for age noncontrast CT appearance of the brain. No skull fracture identified. 2. Face CT reported separately. Electronically Signed   By: Odessa Fleming M.D.   On: 06/10/2023 13:03    Procedures Procedures    Medications Ordered in ED Medications  morphine (PF) 4 MG/ML injection 4 mg (4 mg Intravenous Given 06/10/23 1059)  ondansetron (ZOFRAN) injection 4 mg (4 mg Intravenous Given 06/10/23 1100)  fentaNYL (SUBLIMAZE) injection 50 mcg (50 mcg Intravenous Given 06/10/23 1248)  metoCLOPramide (REGLAN) injection 10 mg (10 mg Intravenous Given 06/10/23 1248)    ED Course/ Medical Decision Making/ A&P                                 Medical Decision Making Amount and/or Complexity of Data Reviewed Radiology: ordered.  Risk OTC drugs. Prescription drug management.   62 year old patient comes in with chief complaint of headache, head trauma.  Patient has clear evidence of ecchymosis to the right side of the face, and right periorbital  region.  Differential diagnosis considered for this patient includes TBI, concussion, facial fractures.  Our evaluation overall is reassuring from ocular perspective.  Patient did not have photophobia on my exam and extraocular muscles intact, finger counting is normal.  Plan is to get CT scan of the head, CT scan of the face.  Patient does not have any blood thinners on which is also reassuring.   Final Clinical Impression(s) / ED Diagnoses Final diagnoses:  Encounter for assessment of work-related causation of injury  Contusion of face, initial encounter  Concussion without loss of consciousness, initial encounter    Rx / DC Orders ED Discharge Orders          Ordered    ibuprofen (ADVIL) 600 MG tablet  Every 6 hours PRN,   Status:  Discontinued        06/10/23 1332    ondansetron (ZOFRAN) 4 MG tablet  Every 6 hours        06/10/23 1332    acetaminophen (TYLENOL) 500 MG tablet  Every 6 hours PRN        06/10/23 1337              Derwood Kaplan, MD 06/10/23 1612

## 2023-06-17 ENCOUNTER — Ambulatory Visit: Payer: No Typology Code available for payment source | Admitting: Family Medicine

## 2023-06-17 ENCOUNTER — Ambulatory Visit (HOSPITAL_BASED_OUTPATIENT_CLINIC_OR_DEPARTMENT_OTHER)
Admission: RE | Admit: 2023-06-17 | Discharge: 2023-06-17 | Disposition: A | Discharge status: Home / Self Care | Payer: No Typology Code available for payment source | Source: Ambulatory Visit | Attending: Family Medicine | Admitting: Family Medicine

## 2023-06-17 ENCOUNTER — Ambulatory Visit (INDEPENDENT_AMBULATORY_CARE_PROVIDER_SITE_OTHER): Payer: No Typology Code available for payment source | Admitting: Family Medicine

## 2023-06-17 ENCOUNTER — Encounter: Payer: Self-pay | Admitting: Family Medicine

## 2023-06-17 VITALS — BP 150/100 | HR 78 | Temp 98.4°F | Resp 18 | Ht 65.0 in | Wt 211.8 lb

## 2023-06-17 DIAGNOSIS — M48061 Spinal stenosis, lumbar region without neurogenic claudication: Secondary | ICD-10-CM

## 2023-06-17 DIAGNOSIS — G894 Chronic pain syndrome: Secondary | ICD-10-CM | POA: Diagnosis not present

## 2023-06-17 DIAGNOSIS — E785 Hyperlipidemia, unspecified: Secondary | ICD-10-CM | POA: Diagnosis not present

## 2023-06-17 DIAGNOSIS — S0990XA Unspecified injury of head, initial encounter: Secondary | ICD-10-CM | POA: Diagnosis not present

## 2023-06-17 DIAGNOSIS — M25511 Pain in right shoulder: Secondary | ICD-10-CM | POA: Insufficient documentation

## 2023-06-17 LAB — COMPREHENSIVE METABOLIC PANEL
ALT: 30 U/L (ref 0–35)
AST: 20 U/L (ref 0–37)
Albumin: 4.3 g/dL (ref 3.5–5.2)
Alkaline Phosphatase: 87 U/L (ref 39–117)
BUN: 21 mg/dL (ref 6–23)
CO2: 28 meq/L (ref 19–32)
Calcium: 9.7 mg/dL (ref 8.4–10.5)
Chloride: 102 meq/L (ref 96–112)
Creatinine, Ser: 0.89 mg/dL (ref 0.40–1.20)
GFR: 69.65 mL/min (ref >60.00)
Glucose, Bld: 90 mg/dL (ref 70–99)
Potassium: 4 meq/L (ref 3.5–5.1)
Sodium: 139 meq/L (ref 135–145)
Total Bilirubin: 0.4 mg/dL (ref 0.2–1.2)
Total Protein: 7.4 g/dL (ref 6.0–8.3)

## 2023-06-17 MED ORDER — HYDROCODONE-ACETAMINOPHEN 5-325 MG PO TABS
1.0000 | ORAL_TABLET | Freq: Four times a day (QID) | ORAL | 0 refills | Status: DC | PRN
Start: 2023-06-17 — End: 2023-11-14

## 2023-06-17 MED ORDER — CARISOPRODOL 350 MG PO TABS
ORAL_TABLET | ORAL | 1 refills | Status: DC
Start: 2023-06-17 — End: 2023-11-14

## 2023-06-17 NOTE — Progress Notes (Signed)
ri  Established Patient Office Visit  Subjective   Patient ID: Yvonne Sanders, female    DOB: 04-14-1961  Age: 62 y.o. MRN: [id number]  Chief Complaint  Patient presents with   ED follow up    Pt states having right shoulder, back, and neck pain.    HPI Discussed the use of AI scribe software for clinical note transcription with the patient, who gave verbal consent to proceed.  History of Present Illness   The patient, with a history of knee surgery, presents after a recent workplace accident. 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 At the time of the accident, the patient was sitting at her desk and did not fall. The patient was taken to the ED where a CT scan of her head and face was performed, but no imaging was done on her shoulder or wrist.  The patient reports that her shoulder and neck started hurting the evening of the accident. She describes a pinching sensation in her neck and shoulder, and difficulty lifting her arm due to pain. The patient also reports muscle spasms in her rib cage the day after the accident. She has a specific spot of pain in her back, between her shoulder blades. The patient's wrist is still a bit sore, but is improving.  The patient also reports a constant headache that worsens with movement and exposure to sunlight. She describes a feeling of fullness on one side of her head, and has been experiencing nausea, particularly when moving around or trying to focus on a computer screen.  The patient has been managing her pain with Excedrin, which contains a lower dose of Tylenol, due to previous concerns about her liver enzymes. She has also been taking meloxicam for her knee, and has a few doses of Robaxin left from her knee surgery, which seems to help with the pain. The patient has also taken a half dose of hydrocodone a few times to help manage the pain.      Patient  Active Problem List   Diagnosis Date Noted   Aortic stenosis, moderate 04/16/2023   Coronary artery calcification 04/16/2023   Acute medial meniscal tear 04/07/2023   Preventative health care 03/30/2021   Acute right ankle pain 01/03/2021   Mid-back pain, acute 01/03/2021   Numbness and tingling of right arm 01/03/2021   Chronic pain syndrome 08/21/2020   Chronic pain of both knees 08/21/2020   Gastroesophageal reflux disease 08/21/2020   Chest pain 08/21/2020   Migraine    Right anterior knee pain 05/15/2017   Plantar fasciitis 05/15/2017   Essential hypertension 04/11/2016   Hyperlipidemia 03/30/2015   GERD (gastroesophageal reflux disease) 03/30/2015   Elevated liver enzymes 03/30/2015   Posterior vitreous detachment 11/15/2014   LVH (left ventricular hypertrophy) 03/28/2014   Elevated blood pressure 03/28/2014   Heart murmur, systolic 03/28/2014   Glaucoma suspect 06/09/2012   Chronic glaucoma 06/09/2012   Primary open angle glaucoma 06/09/2012   Spondylosis of lumbar joint 08/29/2011   Synovial cyst of lumbar facet joint 08/29/2011   Lumbar foraminal stenosis 08/29/2011   Past Medical History:  Diagnosis Date   Abnormal pap 1989   cryo   Arthritis    Cataract    Chicken pox    Endometriosis    Fibroid 1994   History of post uterine   Gallstones    GERD (gastroesophageal reflux disease)  Glaucoma    Headache(784.0)    Hyperlipidemia    Hypertension        Migraine    PONV (postoperative nausea and vomiting)    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     Past Surgical History:  Procedure Laterality Date   APPENDECTOMY  50   BREAST EXCISIONAL BIOPSY Right    CHOLECYSTECTOMY  88   DIAGNOSTIC LAPAROSCOPY  88   endometrious   GLAUCOMA SURGERY Left 2019   KNEE ARTHROSCOPY Left 04/07/2023   Procedure: Left knee arthroscopy; medial meniscal debridement, chondroplasty;   Surgeon: Ollen Gross, MD;  Location: [facility];  Service: Orthopedics;  Laterality: Left;    LUMBAR FUSION  08/2011   l 3-4   LUMBAR FUSION     l 4-5   MENISCUS DEBRIDEMENT Left 04/07/2023   Procedure: DEBRIDEMENT OF MENISCUS;  Surgeon: Ollen Gross, MD;  Location: [facility];  Service: Orthopedics;  Laterality: Left;   RIGHT OOPHORECTOMY  75   Social History   Tobacco Use   Smoking status: Never   Smokeless tobacco: Never  Vaping Use   Vaping status: Never Used  Substance Use Topics   Alcohol use: Not Currently   Drug use: No   Social History   Socioeconomic History   Marital status: Married    Spouse name: Not on file   Number of children: 1   Years of education: Not on file   Highest education level: Not on file  Occupational History   Occupation: Surgical coodinator  Tobacco Use   Smoking status: Never   Smokeless tobacco: Never  Vaping Use   Vaping status: Never Used  Substance and Sexual Activity   Alcohol use: Not Currently   Drug use: No   Sexual activity: Yes    Partners: Male    Birth control/protection: Post-menopausal  Other Topics Concern   Not on file  Social History Narrative   Not much exercise   Social Determinants of Health   Financial Resource Strain: Not on file  Food Insecurity: Not on file  Transportation Needs: Not on file  Physical Activity: Not on file  Stress: Not on file  Social Connections: Not on file  Intimate Partner Violence: Not on file   Family Status  Relation Name Status   Mother  Deceased   Father  Deceased at age 21   Sister  (Not Specified)   Sister  (Not Specified)   Brother  (Not Specified)   Brother  (Not Specified)   MGM  Deceased   MGF  Deceased   PGM  Deceased   PGF  Deceased   Son  Chemical engineer  (Not Specified)   Mat Aunt  (Not Specified)   Emelda Brothers  (Not Specified)   Cousin  (Not Specified)   Neg Hx  (Not Specified)  No partnership data on file   Family History  Problem Relation Age of  Onset   Hyperlipidemia Mother    Hypertension Mother    Hypertension Father    Glaucoma Father    Prostate cancer Father    Renal Disease Father    Macular degeneration Father    Stroke Father    Kidney disease Father    Heart disease Father    Kidney failure Father    Hypertension Sister    Hyperlipidemia Sister    Hypertension  Sister    Hypertension Brother    Heart disease Brother    Hypertension Brother    Breast cancer Maternal Grandmother    Breast cancer Maternal Aunt    Diabetes Maternal Aunt    Diabetes Paternal Aunt    Breast cancer Cousin    Colon cancer Neg Hx    Colon polyps Neg Hx    Stomach cancer Neg Hx    Esophageal cancer Neg Hx    Allergies  Allergen Reactions   Nubain [Nalbuphine Hcl] Anaphylaxis   Nubain [Nalbuphine Hcl] Nausea And Vomiting   Corticosteroids Other (See Comments)    Due to glaucoma can not have steroids   Dilaudid [Hydromorphone Hcl] Nausea And Vomiting    Severe itching   Oxycodone-Acetaminophen Itching   Percodan [Oxycodone-Aspirin] Hives and Itching   Tomato Rash    Rash, spots on tongue      Review of Systems  Constitutional:  Negative for fever and malaise/fatigue.  HENT:  Negative for congestion.   Eyes:  Positive for pain. Negative for blurred vision.  Respiratory:  Negative for shortness of breath.   Cardiovascular:  Negative for chest pain, palpitations and leg swelling.  Gastrointestinal:  Negative for abdominal pain, blood in stool and nausea.  Genitourinary:  Negative for dysuria and frequency.  Musculoskeletal:  Positive for back pain and joint pain. Negative for falls.  Skin:  Negative for rash.  Neurological:  Positive for headaches. Negative for dizziness and loss of consciousness.  Endo/Heme/Allergies:  Negative for environmental allergies.  Psychiatric/Behavioral:  Negative for depression. The patient is not nervous/anxious.       Objective:     BP (!) 150/100 (BP Location: Left Arm, Patient Position:  Sitting, Cuff Size: Large)   Pulse 78   Temp 98.4 F (36.9 C) (Oral)   Resp 18   Ht 5\' 5"  (1.651 m)   Wt 211 lb 12.8 oz (96.1 kg)   LMP 09/03/2007 (Approximate)   SpO2 100%   BMI 35.25 kg/m  BP Readings from Last 3 Encounters:  06/17/23 (!) 150/100  06/10/23 (!) 119/94  05/12/23 110/70   Wt Readings from Last 3 Encounters:  06/17/23 211 lb 12.8 oz (96.1 kg)  06/10/23 200 lb (90.7 kg)  05/12/23 208 lb 9.6 oz (94.6 kg)   SpO2 Readings from Last 3 Encounters:  06/17/23 100%  06/10/23 97%  05/12/23 97%      Physical Exam Vitals and nursing note reviewed.  Constitutional:      General: She is not in acute distress.    Appearance: Normal appearance. She is well-developed.  HENT:     Head: Normocephalic and atraumatic.     Right Ear: Tympanic membrane, ear canal and external ear normal. There is no impacted cerumen.     Left Ear: Tympanic membrane, ear canal and external ear normal. There is no impacted cerumen.     Nose: Nose normal.     Mouth/Throat:     Mouth: Mucous membranes are moist.     Pharynx: Oropharynx is clear. No oropharyngeal exudate or posterior oropharyngeal erythema.  Eyes:     General: No scleral icterus.       Right eye: No discharge.        Left eye: No discharge.     Conjunctiva/sclera: Conjunctivae normal.     Pupils: Pupils are equal, round, and reactive to light.  Neck:     Thyroid: No thyromegaly or thyroid tenderness.     Vascular: No JVD.  Cardiovascular:  Rate and Rhythm: Normal rate and regular rhythm.     Heart sounds: Normal heart sounds. No murmur heard. Pulmonary:     Effort: Pulmonary effort is normal. No respiratory distress.     Breath sounds: Normal breath sounds.  Abdominal:     General: Bowel sounds are normal. There is no distension.     Palpations: Abdomen is soft. There is no mass.     Tenderness: There is no abdominal tenderness. There is no guarding or rebound.  Genitourinary:    Vagina: Normal.  Musculoskeletal:         General: Swelling, tenderness and signs of injury present.     Right shoulder: Swelling, tenderness and crepitus present. Decreased range of motion. Decreased strength.     Left shoulder: Normal.     Cervical back: Normal range of motion and neck supple.     Right lower leg: No edema.     Left lower leg: No edema.  Lymphadenopathy:     Cervical: No cervical adenopathy.  Skin:    General: Skin is warm and dry.     Findings: No erythema or rash.  Neurological:     Mental Status: She is alert and oriented to person, place, and time.     Cranial Nerves: No cranial nerve deficit.     Deep Tendon Reflexes: Reflexes are normal and symmetric.  Psychiatric:        Mood and Affect: Mood normal.        Behavior: Behavior normal.        Thought Content: Thought content normal.        Judgment: Judgment normal.      No results found for any visits on 06/17/23.  Last CBC Lab Results  Component Value Date   WBC 5.1 04/07/2023   HGB 12.1 04/07/2023   HCT 39.5 04/07/2023   MCV 89.6 04/07/2023   MCH 27.4 04/07/2023   RDW 12.2 04/07/2023   PLT 180 04/07/2023   Last metabolic panel Lab Results  Component Value Date   GLUCOSE 94 05/12/2023   NA 142 05/12/2023   K 4.2 05/12/2023   CL 106 05/12/2023   CO2 30 05/12/2023   BUN 11 05/12/2023   CREATININE 0.70 05/12/2023   GFR 92.98 05/12/2023   CALCIUM 9.6 05/12/2023   PHOS 3.6 02/28/2020   PROT 7.0 05/12/2023   ALBUMIN 4.1 05/12/2023   BILITOT 0.6 05/12/2023   ALKPHOS 76 05/12/2023   AST 39 (H) 05/12/2023   ALT 67 (H) 05/12/2023   ANIONGAP 9 04/07/2023   Last lipids Lab Results  Component Value Date   CHOL 185 05/12/2023   HDL 81.50 05/12/2023   LDLCALC 87 05/12/2023   TRIG 85.0 05/12/2023   CHOLHDL 2 05/12/2023   Last hemoglobin A1c Lab Results  Component Value Date   HGBA1C 5.1 03/30/2021   Last thyroid functions Lab Results  Component Value Date   TSH 0.74 04/02/2022   Last vitamin D No results found for:  "25OHVITD2", "25OHVITD3", "VD25OH" Last vitamin B12 and Folate Lab Results  Component Value Date   VITAMINB12 1,505 (H) 08/21/2020      The 10-year ASCVD risk score (Arnett DK, et al., 2019) is: 20.6%    Assessment & Plan:   Problem List Items Addressed This Visit       Unprioritized   Chronic pain syndrome   Relevant Medications   carisoprodol (SOMA) 350 MG tablet   HYDROcodone-acetaminophen (NORCO/VICODIN) 5-325 MG tablet   Lumbar foraminal stenosis  Relevant Medications   carisoprodol (SOMA) 350 MG tablet   Hyperlipidemia   Relevant Medications   HYDROcodone-acetaminophen (NORCO/VICODIN) 5-325 MG tablet   Other Visit Diagnoses     Acute pain of right shoulder    -  Primary   Relevant Orders   DG Shoulder Right   Ambulatory referral to Orthopedic Surgery   Comprehensive metabolic panel   Traumatic injury of head, initial encounter       Relevant Orders   MR Brain Wo Contrast   Comprehensive metabolic panel     Assessment and Plan    Head Trauma Sustained from falling cabinet, resulting in facial bruising and persistent headaches. Possible concussion given symptoms of nausea and imbalance. CT scan performed at ER visit. -Order MRI of the brain to further evaluate. -Continue Excedrin as needed for headache pain, pending liver function test results.  Shoulder Injury Pain and limited range of motion following trauma. Bruising noted on shoulder. -Order shoulder X-ray. -Referral to orthopedic specialist for further evaluation and management. -Continue Meloxicam for pain and inflammation.  Wrist Pain Mild pain following trauma. -Monitor and manage conservatively.  Elevated Blood Pressure Likely secondary to pain. -Monitor closely.  General Health Maintenance -Order comprehensive metabolic panel to assess liver and kidney function given recent increased use of analgesics. -Follow-up appointment scheduled for 06/24/2023.        Return if symptoms worsen  or fail to improve.    Donato Schultz, DO

## 2023-06-17 NOTE — Patient Instructions (Signed)
Head Injury, Adult There are many types of head injuries. Head injuries can be as minor as a small bump, or they can be a serious medical issue. More severe head injuries include: A jarring injury to the brain (concussion). A bruise (contusion) of the brain. This means there is bleeding in the brain that can cause swelling. A cracked skull (skull fracture). Bleeding in the brain that collects, clots, and forms a bump (hematoma). After a head injury, most problems occur within the first 24 hours, but side effects may occur up to 7-10 days after the injury. It is important to watch your condition for any changes. You may need to be observed in the emergency department or urgent care, or you may have to stay in the hospital. What are the causes? There are many causes of a head injury. Serious head injuries may be caused by car crashes, bicycle or motorcycle crashes, sports injuries, falls, or being struck by an object. What are the symptoms? Symptoms of a head injury include a contusion, bump, or bleeding at the site of the injury. Other physical symptoms may include: Headache. Nausea or vomiting. Dizziness. Blurred or double vision. Sensitivity to bright lights or loud noises. Feeling tired. Trouble waking up. Severe symptoms such as: Weakness or numbness on one side of the body. Slurred speech or swallowing problems. Loss of consciousness. Seizures. Mental symptoms may include: Irritability. Confusion and memory problems. Poor attention and concentration. Changes in eating or sleeping habits. Anxiety or depression. How is this diagnosed? This condition is diagnosed based on your symptoms and a physical exam. You may also have imaging tests done, such as a CT scan or an MRI. How is this treated? Treatment for this condition depends on the severity and type of injury you have. The main goal of treatment is to prevent complications and allow the brain time to heal. Mild head injury If  you have a mild head injury, you may be sent home, and treatment may include: Observation. A responsible adult should stay with you for 24 hours after your injury and check on you often. Physical rest. Brain rest. Pain medicines. Severe head injury If you have a severe head injury, treatment may include: Close observation. You may have to stay in the hospital and have: Frequent physical exams. Frequent checks of how your brain and nervous system are working. Your blood pressure and oxygen levels checked. Medicines to relieve pain, prevent seizures, and decrease brain swelling. Airway protection and breathing support. This may include using a ventilator. Monitoring and managing swelling inside the brain. Brain surgery. Surgery may include: Removing a collection of blood or blood clots. Stopping the bleeding. Removing a part of the skull to make room for the brain to swell. Follow these instructions at home: Activity Rest. Avoid activities that are hard or tiring. Make sure you get enough sleep. Let your brain rest by limiting activities that take a lot of thought or attention, such as: Watching TV. Playing memory games and doing puzzles. Job-related work or homework. Working on [employer], [employer], and texting. Avoid activities that could cause another head injury, such as playing sports, until your health care provider approves. Ask your provider when it is safe for you to return to your regular activities, such as work or school. Ask your provider when you can drive, ride a bicycle, or use machinery. Your ability to react may be slower after a brain injury. Do not do these activities if you are dizzy. Lifestyle  Do not drink alcohol until your provider approves. Do not use drugs. Alcohol and certain drugs may slow your recovery and can put you at risk of further injury. If it is hard to remember things, write them down. If you are easily distracted, try to do one thing  at a time. Talk with family members or close friends when making important decisions. Tell your friends, family, a trusted colleague, and work Production designer, theatre/television/film about your injury, symptoms, and restrictions. Ask them to watch for any problems that are new or get worse. General instructions Take over-the-counter and prescription medicines only as told by your provider. Have a responsible adult stay with you for 24 hours after your head injury. They should watch you for any changes in your symptoms and be ready to get help right away. Keep all follow-up visits to make sure your needs are being met and catch any new problems early. How is this prevented? Avoiding another brain injury is very important. In rare cases, another injury can lead to permanent brain damage, brain swelling, or death. The risk of this is greatest during the first 7-10 days after a head injury. To avoid injuries: Improve your balance and strength to avoid falls. Wear a seat belt when you are in a moving vehicle. Wear a helmet when riding a bicycle, skiing, or doing any other sport that has a risk of injury. Take safety measures in your home to prevent falls, such as: Removing clutter and tripping hazards. Using grab bars in bathrooms and handrails by stairs. Placing non-slip mats on floors and in bathtubs. Improving lighting in dim areas. Where to find more information Brain Injury Association: biausa.org Contact a health care provider if: You have headaches that do not go away. You have dizziness that does not go away. You have double vision or vision changes that do not go away. You have difficulty sleeping. You have changes in your mood. You have new symptoms. Get help right away if: You have sudden: Severe headache. Severe vomiting. Unequal pupil size. One is bigger than the other. Vision problems. Confusion or irritability. You have a seizure. Your symptoms get worse. You have clear or bloody fluid coming from your  nose or ears. These symptoms may be an emergency. Get help right away. Call 911. Do not wait to see if the symptoms will go away. Do not drive yourself to the hospital. This information is not intended to replace advice given to you by your health care provider. Make sure you discuss any questions you have with your health care provider. Document Revised: 06/06/2022 Document Reviewed: 06/06/2022 Elsevier Patient Education  2024 [employer].

## 2023-06-18 ENCOUNTER — Telehealth: Payer: Self-pay | Admitting: Family Medicine

## 2023-06-18 ENCOUNTER — Ambulatory Visit (HOSPITAL_COMMUNITY): Admission: RE | Admit: 2023-06-18 | Payer: No Typology Code available for payment source | Source: Ambulatory Visit

## 2023-06-18 NOTE — Telephone Encounter (Signed)
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 I explained that she would need to contact her employer and ask who she would need to follow up with. For MR I advised her to contact the group who is handling the [facility] case for her and ask them to send a MR request (with her signature not electronic one) to our MR dept and they would send records needed. I also explained that for the OV she had with [facility] would be filed to her insurance however, that claim my get denied due to this being WC and she may receive a bill from [facility]. She stated and understanding and may call back if she has questions. I told her I would be happy to help if I can.

## 2023-06-18 NOTE — Telephone Encounter (Signed)
Pt called requesting the OV notes and xray report from 10.15.24 be sent to her case worker for a WC claim. Pt gave the following information for contact:  Alfonso Ramus P: [phone] F: [phone]

## 2023-06-18 NOTE — Telephone Encounter (Signed)
Pt states Victorino Dike is also needing the MRI order faxed to her so they can send it to a participating facility. F# [phone].

## 2023-06-23 ENCOUNTER — Ambulatory Visit: Payer: No Typology Code available for payment source | Admitting: Family Medicine

## 2023-08-22 ENCOUNTER — Other Ambulatory Visit: Payer: Self-pay

## 2023-08-22 DIAGNOSIS — I10 Essential (primary) hypertension: Secondary | ICD-10-CM

## 2023-08-22 MED ORDER — METOPROLOL SUCCINATE ER 50 MG PO TB24: ORAL_TABLET | ORAL | 2 refills | Status: DC

## 2023-09-08 NOTE — Progress Notes (Signed)
 [facility] [place] NEW PATIENT EVALUATION   Referring Physician:  No ref. provider found Primary Care Physician:  No primary care provider on file.   Patient ID:  Yvonne Sanders is a 63 y.o. (DOB [birth year]) female.    Subjective   HPI:  Yvonne Sanders is a 63 y.o. female who presents for follow-up.  The patient continues to endorse significant difficulties with her balance.  Headaches are improving.  Patient is noticing that the headaches are less intense and she is beginning to have periods of time without her headaches.  She is also no longer waking up during the night with her headaches.  Headache pain is typically decreased with lying down.  Patient is also on amitriptyline  and taking this daily.  The patient's imbalance difficulties are different than what she was experiencing last time.  She continues to have waxing and waning of her balance difficulties.  Some days she will have 1 episode of imbalance.  Other days she will be following into objects throughout the entire day.  Of note, the patient has a full-thickness tear of the supraspinatus at the footprint measuring 1.3 x 1.8 cm with tendon retraction to the level of the mid humeral head.  Once patient is cleared by neurology, she is planning on undergoing surgery of her shoulder.  Past Medical History, Past Surgery History, Social History, and Family History were reviewed and updated.    No past medical history on file.  No past surgical history on file. No family history on file. Social History   Socioeconomic History  . Marital status: Married  Tobacco Use  . Smoking status: Never    Passive exposure: Never  . Smokeless tobacco: Never  Vaping Use  . Vaping status: Never Used  Substance and Sexual Activity  . Alcohol use: Never  . Drug use: Never      Current Home Medications   Medication Sig  amitriptyline  HCl (ELAVIL ) 10 mg tablet Take one tablet (10 mg dose) by mouth at  bedtime.  aspirin  81 mg chewable tablet Chew one tablet (81 mg dose) by mouth daily.  Biotin 5000 MCG CAPS Take one capsule (5 mg dose) by mouth daily.  Carboxymethylcellul-Glycerin  (REFRESH OPTIVE) 0.5-0.9 % SOLN Apply one drop to two drops to eye as needed.  Coenzyme Q10 100 MG TABS Take 100 mg by mouth daily.  cycloSPORINE  (RESTASIS ) 0.05 % ophthalmic emulsion Place one drop into both eyes 2 (two) times daily.  esomeprazole  magnesium  (NEXIUM ) 40 mg capsule Take one capsule (40 mg dose) by mouth daily.  ezetimibe  (ZETIA ) 10 MG tablet Take one tablet (10 mg dose) by mouth daily.  losartan -hydrochlorothiazide  (HYZAAR) 100-25 MG per tablet Take one tablet by mouth daily.  meloxicam  (MOBIC ) 15 mg tablet Take one tablet (15 mg dose) by mouth daily.  methocarbamol (ROBAXIN) 500 MG tablet Take one tablet (500 mg dose) by mouth 3 (three) times a day as needed.  metoprolol  succinate (TOPROL -XL) 50 mg 24 hr tablet Take one tablet (50 mg dose) by mouth daily.  metoprolol  tartrate (LOPRESSOR ) 50 mg tablet Take one tablet (50 mg dose) by mouth daily.  ondansetron  (ZOFRAN ) 4 mg tablet Take one tablet (4 mg dose) by mouth every 6 (six) hours.  ROCKLATAN  0.02-0.005 % SOLN ophthalmic solution Place one drop into both eyes at bedtime.  rosuvastatin  calcium  (CRESTOR ) 40 mg tablet Take one tablet (40 mg dose) by mouth daily.  SIMBRINZA 1-0.2 % ophthalmic solution Place one drop (0.05 mLs dose) into  both eyes 3 (three) times a day.  THERA M PLUS (THERA M PLUS) TABS tablet Take one tablet by mouth daily.    The patient is allergic to hydromorphone  hcl and tomato.  Review of Systems is complete and negative except as noted in History of Present Illness.  Objective    PHYSICAL EXAM: BP 134/82 (BP Location: Right Upper Arm, Patient Position: Sitting)   Pulse 84   Ht 5' 5 (1.651 m)   Wt 220 lb (99.8 kg)   SpO2 97%   BMI 36.61 kg/m  GENERAL: Awake, alert, in no acute distress Psych: Affect appropriate  for situation, patient is calm and cooperative with examination Head: Normocephalic and atraumatic, without obvious abnormality EENT: Normal conjunctivae, dry mucous membranes, no OP obstruction LUNGS: Normal respiratory effort. Non-labored breathing on room air. Clear to auscultation bilaterally. CV: Auscultation: regular rate and rhythm. Extremities: Warm, well perfused, without obvious deformity  MENTAL STATUS EXAM: Orientation: Alert and oriented to person, place and time. Memory: Cooperative, follows commands well. Recent and remote memory normal.. Attention, concentration: Attention span and concentration are normal. Language: Speech is clear and language is normal. Fund of knowledge: Aware of current events, vocabulary appropriate for patient age.   CRANIAL NERVES: CN 2 (Optic): Visual fields intact to confrontation, funduscopic examination without optic disk pallor or edema, retinal vessels are normal. CN 3,4,6 (EOM): Pupils equal and reactive to light. Full extraocular eye movement without nystagmus. CN 5 (Trigeminal): Facial sensation is normal, no weakness of masticatory muscles. CN 7 (Facial): No facial weakness or asymmetry. CN 8 (Auditory): Auditory acuity grossly normal. CN 9,10 (Glossophar): The uvula is midline, the palate elevates symmetrically. CN 11 (spinal access): Normal sternocleidomastoid and trapezius strength. CN 12 (Hypoglossal): The tongue is midline. No atrophy or fasciculations.   MOTOR: Muscle Strength: Strength - 5/5 and symmetric in the upper and lower extremities, no pronation, slight RUE drift. Muscle Tone: Tone and muscle bulk are normal in the upper and lower extremities.    REFLEXES:   DTRs - 2+ and symmetrical in all four extremities, plantar responses are flexor bilaterally.    COORDINATION:   Intact finger-to-nose, heel-to-shin, and rapid alternating movements, no tremor.    SENSATION:  Intact to light touch, vibration, pinprick.  No sensory  extinction to DSS. Negative Romberg test.   GAIT: Routine gait is impaired, tandem gait is impaired.  Romberg positive   DIAGNOSTIC STUDIES:    MRI Head w/ and w/o contrast - 08/21/2023 1.   No acute intracranial abnormality. 2.  A couple small FLAIR hyperintensities in the cerebral white matter. 3.  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 4.  Partial imaged upper cervical degenerative disc disease with spinal canal narrowing, not optimally evaluated on this exam. Correlate clinically, consider dedicated cervical imaging if indicated. 5.  A small 5 mm oval-shaped nodule at the superficial left parotid gland, differential diagnosis includes a intraparotid/periparotid lymph node or small nonspecific parotid gland lesion. Correlate for a focal palpable finding.  CT Head w/o contrast -06/10/2023 -No acute intracranial abnormality   CT maxillofacial w/o contrast -06/10/2023  LABORATORY STUDIES:   No visits with results within 1 Year(s) from this visit.  Latest known visit with results is:  No results found for any previous visit.      Assessment   63 y.o. female with PMHx as above who presents for follow-up.  Patient is approximately 2-1/2 months status post traumatic work injury.  Patient's headaches are beginning  to improve and are becoming less intense.  However, the patient's balance is continuing to cause her significant difficulties.  Her neurological examination revealed slight right upper extremity drift and a positive Romberg and impaired tandem and routine gait.  The patient's recent MRI of her head did not show any acute intracranial abnormalities.  There were a few small FLAIR hyperintensities in the cerebral white matter.  There is also a small 5 mm oval-shaped nodule at the superficial left parotid gland.  On my examination today, I was unable to palpate the oval-shaped nodule that was reported on the MRI.  I recommended the patient follow-up  with her primary care provider for continued evaluation.  Since the patient is continuing to have significant imbalance with evidence of a spinal stenosis on her MRI head, I have recommended that we proceed with an MRI of the cervical spine.  We will also proceed with a CT angiogram of the head and neck to ensure there are no areas of stenosis that could be causing some of the patient's symptoms.  Plan   1. Traumatic brain injury, with unknown loss of consciousness status, subsequent encounter 2. Post concussive syndrome - amitriptyline  HCl (ELAVIL ) 10 mg tablet; Take one tablet (10 mg dose) by mouth at bedtime.  Dispense: 30 tablet; Refill: 3  3. Nodule of parotid gland -Recommended follow-up with PCP  4. Cervical stenosis of spinal canal - MRI Spine Cervical WO Contrast; Future  5. Imbalance - CT Angio Head Neck; Future   Risks, benefits, and alternatives of the medications and treatment plan prescribed today were discussed, and patient expressed understanding and agreement with the plan.  All new prescription medications and changes in current prescription dosages were discussed with the patient, including patient education, medication name, use, dosage, potential side effects, drug interactions, consequences of not using/taking and special instructions. Patient expressed understanding. No barriers to adherence.  Follow up in about 4 weeks (around 10/06/2023).  Electronically Signed: Fonda FREDRIK Minus, DNP, AGNP-C Neurology Nurse Practitioner  [facility] Neurology [facility] 09/08/2023 10:39 AM  *This note was dictated with voice recognition software. Inadvertently, similar sounding words can, sometimes, get transcribed incorrectly

## 2023-09-09 ENCOUNTER — Ambulatory Visit (INDEPENDENT_AMBULATORY_CARE_PROVIDER_SITE_OTHER): Payer: No Typology Code available for payment source | Admitting: Family Medicine

## 2023-09-09 ENCOUNTER — Encounter: Payer: Self-pay | Admitting: Family Medicine

## 2023-09-09 VITALS — BP 140/60 | HR 98 | Temp 98.2°F | Resp 18 | Ht 65.0 in | Wt 223.0 lb

## 2023-09-09 DIAGNOSIS — R42 Dizziness and giddiness: Secondary | ICD-10-CM | POA: Diagnosis not present

## 2023-09-09 DIAGNOSIS — M25511 Pain in right shoulder: Secondary | ICD-10-CM | POA: Diagnosis not present

## 2023-09-09 NOTE — Patient Instructions (Signed)
 Rotator Cuff Tear  A rotator cuff tear is a partial or complete tear of the cord-like bands (tendons) that connect muscle to bone in the rotator cuff. The rotator cuff is a group of muscles and tendons that surround the shoulder joint and keep the upper arm bone (humerus) in the shoulder socket. The tear can occur suddenly (acute tear) or can develop over a long period of time (chronic tear). What are the causes? Acute tears may be caused by: A fall, especially on an outstretched arm. Lifting very heavy objects with a jerking motion. Chronic tears may be caused by overuse of the muscles. This may happen in sports, physical work, or activities in which your arm repeatedly moves over your head. What increases the risk? This condition is more likely to occur in: Athletes and workers who frequently use their shoulder or reach over their head. This may include activities such as: Tennis. Baseball and softball. Swimming and rowing. Weight lifting. Construction work. Painting. People who smoke. Older people who have arthritis or poor blood supply. These can make the muscles and tendons weaker. What are the signs or symptoms? Symptoms of this condition depend on the type and severity of the injury: An acute tear may include a sudden tearing feeling, followed by severe pain that goes from your upper shoulder, down your arm, and toward your elbow. A chronic tear includes a gradual weakness and decreased shoulder motion as the pain gets worse. The pain is usually worse at night. Both types may have symptoms such as: Pain that spreads (radiates) from the shoulder to the upper arm. Swelling and tenderness in front of the shoulder. Decreased range of motion. Pain when: Reaching, pulling, or lifting the arm above the head. Lowering the arm from above the head. Not being able to raise your arm out to the side. Difficulty placing the arm behind your back. How is this diagnosed? This condition is  diagnosed with a medical history and physical exam. Imaging tests may also be done, including: X-rays. MRI. Ultrasound. CT or MR arthrogram. During this test, a contrast material is injected into your shoulder, and then images are taken. How is this treated? Treatment for this condition depends on the type and severity of the condition. In less severe cases, treatment may include: Rest. This may be done with a sling that holds the shoulder still (immobilization). Your health care provider may also recommend avoiding activities that involve lifting your arm over your head. Icing the shoulder. Anti-inflammatory medicines, such as aspirin  or ibuprofen . Strengthening and stretching exercises. Your health care provider may recommend specific exercises to improve your range of motion and strengthen your shoulder. In more severe cases, treatment may include: Physical therapy. Steroid injections. Surgery. Follow these instructions at home: Managing pain, stiffness, and swelling     If directed, put ice on the injured area. To do this: If you have a removable sling, remove it as told by your health care provider. Put ice in a plastic bag. Place a towel between your skin and the bag. Leave the ice on for 20 minutes, 2-3 times a day. Remove the ice if your skin turns bright red. This is very important. If you cannot feel pain, heat, or cold, you have a greater risk of damage to the area. Raise (elevate) the injured area above the level of your heart while you are lying down. Find a comfortable sleeping position or sleep on a recliner, if available. Move your fingers often to reduce stiffness and  swelling. Once the swelling has gone down, your health care provider may direct you to apply heat to relax the muscles. Use the heat source that your health care provider recommends, such as a moist heat pack or a heating pad. Place a towel between your skin and the heat source. Leave the heat on for  20-30 minutes. Remove the heat if your skin turns bright red. This is especially important if you are unable to feel pain, heat, or cold. You have a greater risk of getting burned. If you have a sling: Wear the sling as told by your health care provider. Remove it only as told by your health care provider. Loosen the sling if your fingers tingle, become numb, or turn cold and blue. Keep the sling clean. If the sling is not waterproof: Do not let it get wet. Cover it with a watertight covering when you take a bath or a shower. Activity Rest your shoulder as told by your health care provider. Return to your normal activities as told by your health care provider. Ask your health care provider what activities are safe for you. Ask your health care provider when it is safe to drive if you have a sling on your arm. Do any exercises or stretches as told by your health care provider. General instructions Take over-the-counter and prescription medicines only as told by your health care provider. Do not use any products that contain nicotine or tobacco, such as cigarettes, e-cigarettes, and chewing tobacco. If you need help quitting, ask your health care provider. Keep all follow-up visits. This is important. Contact a health care provider if: Your pain gets worse. You have new pain in your arm, hands, or fingers. Medicine does not help your pain. Get help right away if: Your arm, hand, or fingers are numb or tingling. Your arm, hand, or fingers are swollen or painful or they turn white or blue. Your hand or fingers on your injured arm are colder than your other hand. Summary A rotator cuff tear is a partial or complete tear of the cord-like bands (tendons) that connect muscle to bone in the rotator cuff. The tear can occur suddenly (acute tear) or can develop over a long period of time (chronic tear). Treatment generally includes rest, anti-inflammatory medicines, and icing. In some cases,  physical therapy and steroid injections may be needed. In severe cases, surgery may be needed. This information is not intended to replace advice given to you by your health care provider. Make sure you discuss any questions you have with your health care provider. Document Revised: 12/22/2019 Document Reviewed: 12/22/2019 Elsevier Patient Education  2024 [employer] How to Perform the Epley Maneuver The Epley maneuver is an exercise that relieves symptoms of vertigo. Vertigo is the feeling that you or your surroundings are moving when they are not. When you feel vertigo, you may feel like the room is spinning and may have trouble walking. The Epley maneuver is used for a type of vertigo caused by a calcium  deposit in a part of the inner ear. The maneuver involves changing head positions to help the deposit move out of the area. You can do this maneuver at home whenever you have symptoms of vertigo. You can repeat it in 24 hours if your vertigo has not gone away. Even though the Epley maneuver may relieve your vertigo for a few weeks, it is possible that your symptoms will return. This maneuver relieves vertigo, but it does not relieve dizziness. What are  the risks? If it is done correctly, the Epley maneuver is considered safe. Sometimes it can lead to dizziness or nausea that goes away after a short time. If you develop other symptoms--such as changes in vision, weakness, or numbness--stop doing the maneuver and call your health care provider. Supplies needed: A bed or table. A pillow. How to do the Epley maneuver     Sit on the edge of a bed or table with your back straight and your legs extended or hanging over the edge of the bed or table. Turn your head halfway toward the affected ear or side as told by your health care provider. Lie backward quickly with your head turned until you are lying flat on your back. Your head should dangle (head-hanging position). You may want to position a  pillow under your shoulders. Hold this position for at least 30 seconds. If you feel dizzy or have symptoms of vertigo, continue to hold the position until the symptoms stop. Turn your head to the opposite direction until your unaffected ear is facing down. Your head should continue to dangle. Hold this position for at least 30 seconds. If you feel dizzy or have symptoms of vertigo, continue to hold the position until the symptoms stop. Turn your whole body to the same side as your head so that you are positioned on your side. Your head will now be nearly facedown and no longer needs to dangle. Hold for at least 30 seconds. If you feel dizzy or have symptoms of vertigo, continue to hold the position until the symptoms stop. Sit back up. You can repeat the maneuver in 24 hours if your vertigo does not go away. Follow these instructions at home: For 24 hours after doing the Epley maneuver: Keep your head in an upright position. When lying down to sleep or rest, keep your head raised (elevated) with two or more pillows. Avoid excessive neck movements. Activity Do not drive or use machinery if you feel dizzy. After doing the Epley maneuver, return to your normal activities as told by your health care provider. Ask your health care provider what activities are safe for you. General instructions Drink enough fluid to keep your urine pale yellow. Do not drink alcohol. Take over-the-counter and prescription medicines only as told by your health care provider. Keep all follow-up visits. This is important. Preventing vertigo symptoms Ask your health care provider if there is anything you should do at home to prevent vertigo. He or she may recommend that you: Keep your head elevated with two or more pillows while you sleep. Do not sleep on the side of your affected ear. Get up slowly from bed. Avoid sudden movements during the day. Avoid extreme head positions or movement, such as looking up or  bending over. Contact a health care provider if: Your vertigo gets worse. You have other symptoms, including: Nausea. Vomiting. Headache. Get help right away if you: Have vision changes. Have a headache or neck pain that is severe or getting worse. Cannot stop vomiting. Have new numbness or weakness in any part of your body. These symptoms may represent a serious problem that is an emergency. Do not wait to see if the symptoms will go away. Get medical help right away. Call your local emergency services (911 in the [place] Do not drive yourself to the hospital. Summary Vertigo is the feeling that you or your surroundings are moving when they are not. The Epley maneuver is an exercise that relieves symptoms of  vertigo. If the Epley maneuver is done correctly, it is considered safe. This information is not intended to replace advice given to you by your health care provider. Make sure you discuss any questions you have with your health care provider. Document Revised: 07/19/2020 Document Reviewed: 07/19/2020 Elsevier Patient Education  2024 [employer].

## 2023-09-09 NOTE — Progress Notes (Addendum)
 Established Patient Office Visit  Subjective   Patient ID: Yvonne Sanders, female    DOB: 08/03/1961  Age: 63 y.o. MRN: [id number]  Chief Complaint  Patient presents with   Follow-up    HPI The patient, with a history of a recent accident, presents with ongoing balance issues and dizziness. She reports a sensation of her head feeling funky and has experienced falls since the accident in October. An MRI of the brain has been conducted, and the patient is awaiting further neurology clearance, including an MRI of the cervical spine and a CT angiogram. The patient also mentions a recent consultation with an ENT specialist due to potential inner ear damage.  In addition to the neurological symptoms, the patient reports a concern about a possible nodule on the left parotid gland. However, she notes that the specialist did not feel anything during the examination. The patient is also awaiting shoulder surgery, which has been delayed due to the need for neurology clearance. She expresses concern about post-operative pain management and the potential impact on her existing pain contract.  The patient also discusses weight management issues, attributing recent weight gain to her current immobility. She inquires about the use of appetite suppressants, specifically phentermine, but expresses a desire to return to regular exercise post-surgery. The patient also mentions perimenopausal symptoms, including hot flashes and anxiety. She is not currently taking birth control but considers resuming it to manage these symptoms. Patient Active Problem List   Diagnosis Date Noted   Dizziness 09/09/2023   Acute pain of right shoulder 09/09/2023   Aortic stenosis, moderate 04/16/2023   Coronary artery calcification 04/16/2023   Acute medial meniscal tear 04/07/2023   Preventative health care 03/30/2021   Acute right ankle pain 01/03/2021   Mid-back pain, acute 01/03/2021   Numbness and tingling of right arm  01/03/2021   Chronic pain syndrome 08/21/2020   Chronic pain of both knees 08/21/2020   Gastroesophageal reflux disease 08/21/2020   Chest pain 08/21/2020   Migraine    Right anterior knee pain 05/15/2017   Plantar fasciitis 05/15/2017   Essential hypertension 04/11/2016   Hyperlipidemia 03/30/2015   GERD (gastroesophageal reflux disease) 03/30/2015   Elevated liver enzymes 03/30/2015   Posterior vitreous detachment 11/15/2014   LVH (left ventricular hypertrophy) 03/28/2014   Elevated blood pressure 03/28/2014   Heart murmur, systolic 03/28/2014   Glaucoma suspect 06/09/2012   Chronic glaucoma 06/09/2012   Primary open angle glaucoma 06/09/2012   Spondylosis of lumbar joint 08/29/2011   Synovial cyst of lumbar facet joint 08/29/2011   Lumbar foraminal stenosis 08/29/2011   Past Medical History:  Diagnosis Date   Abnormal pap 1989   cryo   Arthritis    Cataract    Chicken pox    Endometriosis    Fibroid 1994   History of post uterine   Gallstones    GERD (gastroesophageal reflux disease)    Glaucoma    Headache(784.0)    Hyperlipidemia    Hypertension        Migraine    PONV (postoperative nausea and vomiting)    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     Past Surgical History:  Procedure Laterality Date   APPENDECTOMY  90   BREAST EXCISIONAL BIOPSY Right    CHOLECYSTECTOMY  88   DIAGNOSTIC LAPAROSCOPY  88   endometrious   GLAUCOMA  SURGERY Left 2019   KNEE ARTHROSCOPY Left 04/07/2023   Procedure: Left knee arthroscopy; medial meniscal debridement, chondroplasty;  Surgeon: Melodi Lerner, MD;  Location: [facility];  Service: Orthopedics;  Laterality: Left;    LUMBAR FUSION  08/2011   l 3-4   LUMBAR FUSION     l 4-5   MENISCUS DEBRIDEMENT Left 04/07/2023   Procedure: DEBRIDEMENT OF MENISCUS;  Surgeon: Melodi Lerner, MD;  Location: [facility];  Service: Orthopedics;  Laterality:  Left;   RIGHT OOPHORECTOMY  75   Social History   Tobacco Use   Smoking status: Never   Smokeless tobacco: Never  Vaping Use   Vaping status: Never Used  Substance Use Topics   Alcohol use: Not Currently   Drug use: No   Social History   Socioeconomic History   Marital status: Married    Spouse name: Not on file   Number of children: 1   Years of education: Not on file   Highest education level: Not on file  Occupational History   Occupation: Surgical coodinator  Tobacco Use   Smoking status: Never   Smokeless tobacco: Never  Vaping Use   Vaping status: Never Used  Substance and Sexual Activity   Alcohol use: Not Currently   Drug use: No   Sexual activity: Yes    Partners: Male    Birth control/protection: Post-menopausal  Other Topics Concern   Not on file  Social History Narrative   Not much exercise   Social Drivers of Health   Financial Resource Strain: Low Risk  (09/08/2023)   Received from [facility]   Overall Financial Resource Strain (CARDIA)    Difficulty of Paying Living Expenses: Not very hard  Food Insecurity: No Food Insecurity (09/08/2023)   Received from [facility]   Hunger Vital Sign    Worried About Running Out of Food in the Last Year: Never true    Ran Out of Food in the Last Year: Never true  Transportation Needs: No Transportation Needs (09/08/2023)   Received from [facility] - Transportation    Lack of Transportation (Medical): No    Lack of Transportation (Non-Medical): No  Physical Activity: Not on file  Stress: Not on file  Social Connections: Not on file  Intimate Partner Violence: Not on file   Family Status  Relation Name Status   Mother  Deceased   Father  Deceased at age 66   Sister  (Not Specified)   Sister  (Not Specified)   Brother  (Not Specified)   Brother  (Not Specified)   MGM  Deceased   MGF  Deceased   PGM  Deceased   PGF  Deceased   Son  Chemical Engineer  (Not Specified)   Mat Aunt  (Not  Specified)   Bruna Nyhan  (Not Specified)   Cousin  (Not Specified)   Neg Hx  (Not Specified)  No partnership data on file   Family History  Problem Relation Age of Onset   Hyperlipidemia Mother    Hypertension Mother    Hypertension Father    Glaucoma Father    Prostate cancer Father    Renal Disease Father    Macular degeneration Father    Stroke Father    Kidney disease Father    Heart disease Father    Kidney failure Father    Hypertension Sister    Hyperlipidemia Sister    Hypertension Sister    Hypertension Brother  Heart disease Brother    Hypertension Brother    Breast cancer Maternal Grandmother    Breast cancer Maternal Aunt    Diabetes Maternal Aunt    Diabetes Paternal Aunt    Breast cancer Cousin    Colon cancer Neg Hx    Colon polyps Neg Hx    Stomach cancer Neg Hx    Esophageal cancer Neg Hx    Allergies  Allergen Reactions   Nubain [Nalbuphine Hcl] Anaphylaxis   Nubain [Nalbuphine Hcl] Nausea And Vomiting   Corticosteroids Other (See Comments)    Due to glaucoma can not have steroids   Dilaudid  [Hydromorphone  Hcl] Nausea And Vomiting    Severe itching   Oxycodone -Acetaminophen  Itching   Percodan [Oxycodone -Aspirin ] Hives and Itching   Tomato Rash    Rash, spots on tongue      Review of Systems  Constitutional:  Negative for fever and malaise/fatigue.  HENT:  Negative for congestion.   Eyes:  Negative for blurred vision.  Respiratory:  Negative for cough and shortness of breath.   Cardiovascular:  Negative for chest pain, palpitations and leg swelling.  Gastrointestinal:  Negative for abdominal pain, blood in stool, nausea and vomiting.  Genitourinary:  Negative for dysuria and frequency.  Musculoskeletal:  Positive for joint pain. Negative for back pain and falls.  Skin:  Negative for rash.  Neurological:  Positive for dizziness. Negative for loss of consciousness and headaches.  Endo/Heme/Allergies:  Negative for environmental allergies.   Psychiatric/Behavioral:  Negative for depression. The patient is not nervous/anxious.       Objective:     BP (!) 140/60   Pulse 98   Temp 98.2 F (36.8 C)   Resp 18   Ht 5' 5 (1.651 m)   Wt 223 lb (101.2 kg)   LMP 09/03/2007 (Approximate)   SpO2 100%   BMI 37.11 kg/m  BP Readings from Last 3 Encounters:  09/09/23 (!) 140/60  06/17/23 (!) 150/100  06/10/23 (!) 119/94   Wt Readings from Last 3 Encounters:  09/09/23 223 lb (101.2 kg)  06/17/23 211 lb 12.8 oz (96.1 kg)  06/10/23 200 lb (90.7 kg)   SpO2 Readings from Last 3 Encounters:  09/09/23 100%  06/17/23 100%  06/10/23 97%      Physical Exam Vitals and nursing note reviewed.  Constitutional:      General: She is not in acute distress.    Appearance: Normal appearance. She is well-developed.  HENT:     Head: Normocephalic and atraumatic.  Eyes:     General: No scleral icterus.       Right eye: No discharge.        Left eye: No discharge.  Cardiovascular:     Rate and Rhythm: Normal rate and regular rhythm.     Heart sounds: No murmur heard. Pulmonary:     Effort: Pulmonary effort is normal. No respiratory distress.     Breath sounds: Normal breath sounds.  Musculoskeletal:        General: Tenderness present. Normal range of motion.     Cervical back: Normal range of motion and neck supple.     Right lower leg: No edema.     Left lower leg: No edema.  Skin:    General: Skin is warm and dry.  Neurological:     Mental Status: She is alert and oriented to person, place, and time.     Gait: Gait abnormal.     Comments: Pt walking with cane due to  dizziness   Psychiatric:        Mood and Affect: Mood normal.        Behavior: Behavior normal.        Thought Content: Thought content normal.        Judgment: Judgment normal.      No results found for any visits on 09/09/23.  Last CBC Lab Results  Component Value Date   WBC 5.1 04/07/2023   HGB 12.1 04/07/2023   HCT 39.5 04/07/2023   MCV 89.6  04/07/2023   MCH 27.4 04/07/2023   RDW 12.2 04/07/2023   PLT 180 04/07/2023   Last metabolic panel Lab Results  Component Value Date   GLUCOSE 90 06/17/2023   NA 139 06/17/2023   K 4.0 06/17/2023   CL 102 06/17/2023   CO2 28 06/17/2023   BUN 21 06/17/2023   CREATININE 0.89 06/17/2023   GFR 69.65 06/17/2023   CALCIUM  9.7 06/17/2023   PHOS 3.6 02/28/2020   PROT 7.4 06/17/2023   ALBUMIN 4.3 06/17/2023   BILITOT 0.4 06/17/2023   ALKPHOS 87 06/17/2023   AST 20 06/17/2023   ALT 30 06/17/2023   ANIONGAP 9 04/07/2023   Last lipids Lab Results  Component Value Date   CHOL 185 05/12/2023   HDL 81.50 05/12/2023   LDLCALC 87 05/12/2023   TRIG 85.0 05/12/2023   CHOLHDL 2 05/12/2023   Last hemoglobin A1c Lab Results  Component Value Date   HGBA1C 5.1 03/30/2021   Last thyroid functions Lab Results  Component Value Date   TSH 0.74 04/02/2022   Last vitamin D No results found for: 25OHVITD2, 25OHVITD3, VD25OH Last vitamin B12 and Folate Lab Results  Component Value Date   VITAMINB12 1,505 (H) 08/21/2020      The 10-year ASCVD risk score (Arnett DK, et al., 2019) is: 7.7%    Assessment & Plan:   Problem List Items Addressed This Visit       Unprioritized   Dizziness - Primary   Per neuro  Mri brain reviewed  Pt waiting for ct angio       Acute pain of right shoulder   Pt waiting for neuro clearance for shoulder surgery for torn rotator cuff     Assessment and Plan   Assessment and Plan                    Return if symptoms worsen or fail to improve.    Annleigh Knueppel R Lowne Chase, DO

## 2023-09-09 NOTE — Assessment & Plan Note (Signed)
 Per neuro  Mri brain reviewed  Pt waiting for ct angio

## 2023-09-09 NOTE — Assessment & Plan Note (Signed)
 Pt waiting for neuro clearance for shoulder surgery for torn rotator cuff

## 2023-11-06 ENCOUNTER — Ambulatory Visit (HOSPITAL_COMMUNITY): Payer: No Typology Code available for payment source | Attending: Internal Medicine

## 2023-11-06 DIAGNOSIS — I350 Nonrheumatic aortic (valve) stenosis: Secondary | ICD-10-CM

## 2023-11-06 LAB — ECHOCARDIOGRAM COMPLETE
AR max vel: 1.15 cm2
AV Area VTI: 1.24 cm2
AV Area mean vel: 1.14 cm2
AV Mean grad: 24 mmHg
AV Peak grad: 46 mmHg
Ao pk vel: 3.39 m/s
Area-P 1/2: 3.53 cm2
S' Lateral: 2.4 cm

## 2023-11-14 ENCOUNTER — Ambulatory Visit (INDEPENDENT_AMBULATORY_CARE_PROVIDER_SITE_OTHER): Payer: No Typology Code available for payment source | Admitting: Family Medicine

## 2023-11-14 ENCOUNTER — Encounter: Payer: Self-pay | Admitting: Family Medicine

## 2023-11-14 VITALS — BP 126/60 | HR 63 | Temp 97.7°F | Resp 18 | Ht 65.0 in | Wt 220.8 lb

## 2023-11-14 DIAGNOSIS — Z0000 Encounter for general adult medical examination without abnormal findings: Secondary | ICD-10-CM | POA: Diagnosis not present

## 2023-11-14 DIAGNOSIS — I10 Essential (primary) hypertension: Secondary | ICD-10-CM

## 2023-11-14 DIAGNOSIS — E785 Hyperlipidemia, unspecified: Secondary | ICD-10-CM

## 2023-11-14 DIAGNOSIS — M48061 Spinal stenosis, lumbar region without neurogenic claudication: Secondary | ICD-10-CM

## 2023-11-14 DIAGNOSIS — Z79899 Other long term (current) drug therapy: Secondary | ICD-10-CM

## 2023-11-14 DIAGNOSIS — R011 Cardiac murmur, unspecified: Secondary | ICD-10-CM

## 2023-11-14 DIAGNOSIS — G894 Chronic pain syndrome: Secondary | ICD-10-CM

## 2023-11-14 LAB — COMPREHENSIVE METABOLIC PANEL
ALT: 49 U/L — ABNORMAL HIGH (ref 0–35)
AST: 32 U/L (ref 0–37)
Albumin: 4.5 g/dL (ref 3.5–5.2)
Alkaline Phosphatase: 79 U/L (ref 39–117)
BUN: 12 mg/dL (ref 6–23)
CO2: 25 meq/L (ref 19–32)
Calcium: 9.7 mg/dL (ref 8.4–10.5)
Chloride: 104 meq/L (ref 96–112)
Creatinine, Ser: 0.69 mg/dL (ref 0.40–1.20)
GFR: 92.97 mL/min (ref >60.00)
Glucose, Bld: 101 mg/dL — ABNORMAL HIGH (ref 70–99)
Potassium: 3.9 meq/L (ref 3.5–5.1)
Sodium: 141 meq/L (ref 135–145)
Total Bilirubin: 0.6 mg/dL (ref 0.2–1.2)
Total Protein: 7.7 g/dL (ref 6.0–8.3)

## 2023-11-14 LAB — CBC WITH DIFFERENTIAL/PLATELET
Basophils Absolute: 0 10*3/uL (ref 0.0–0.1)
Basophils Relative: 0.2 % (ref 0.0–3.0)
Eosinophils Absolute: 0 10*3/uL (ref 0.0–0.7)
Eosinophils Relative: 0.4 % (ref 0.0–5.0)
HCT: 36.6 % (ref 36.0–46.0)
Hemoglobin: 11.6 g/dL — ABNORMAL LOW (ref 12.0–15.0)
Lymphocytes Relative: 29.3 % (ref 12.0–46.0)
Lymphs Abs: 1.9 10*3/uL (ref 0.7–4.0)
MCHC: 31.6 g/dL (ref 30.0–36.0)
MCV: 86.1 fl (ref 78.0–100.0)
Monocytes Absolute: 0.6 10*3/uL (ref 0.1–1.0)
Monocytes Relative: 9 % (ref 3.0–12.0)
Neutro Abs: 4 10*3/uL (ref 1.4–7.7)
Neutrophils Relative %: 61.1 % (ref 43.0–77.0)
Platelets: 262 10*3/uL (ref 150.0–400.0)
RBC: 4.25 Mil/uL (ref 3.87–5.11)
RDW: 13.7 % (ref 11.5–15.5)
WBC: 6.5 10*3/uL (ref 4.0–10.5)

## 2023-11-14 LAB — LIPID PANEL
Cholesterol: 163 mg/dL (ref 0–200)
HDL: 61.5 mg/dL (ref >39.00)
LDL Cholesterol: 76 mg/dL (ref 0–99)
NonHDL: 101
Total CHOL/HDL Ratio: 3
Triglycerides: 124 mg/dL (ref 0.0–149.0)
VLDL: 24.8 mg/dL (ref 0.0–40.0)

## 2023-11-14 LAB — TSH: TSH: 1.03 u[IU]/mL (ref 0.35–5.50)

## 2023-11-14 MED ORDER — ROSUVASTATIN CALCIUM 40 MG PO TABS: 40.0000 mg | ORAL_TABLET | Freq: Every day | ORAL | 1 refills | Status: DC

## 2023-11-14 MED ORDER — CARISOPRODOL 350 MG PO TABS
ORAL_TABLET | ORAL | 1 refills | Status: DC
Start: 2023-11-14 — End: 2024-05-17

## 2023-11-14 MED ORDER — EZETIMIBE 10 MG PO TABS: 10.0000 mg | ORAL_TABLET | Freq: Every day | ORAL | 1 refills | Status: DC

## 2023-11-14 MED ORDER — HYDROCODONE-ACETAMINOPHEN 5-325 MG PO TABS: 1.0000 | ORAL_TABLET | Freq: Four times a day (QID) | ORAL | 0 refills | Status: AC | PRN

## 2023-11-14 NOTE — Assessment & Plan Note (Signed)
Stable  F/u cardiology 

## 2023-11-14 NOTE — Progress Notes (Signed)
 Established Patient Office Visit  Subjective   Patient ID: Yvonne Sanders, female    DOB: [birth year]  Age: 63 y.o. MRN: [id number]  Chief Complaint  Patient presents with  . Annual Exam    Pt states fasting     HPI Discussed the use of AI scribe software for clinical note transcription with the patient, who gave verbal consent to proceed.  History of Present Illness   Yvonne Sanders is a 63 year old female who presents for an annual physical exam.  Her last physical was in August 2023, followed by a six-month follow-up in September 2023. She is up to date with her flu vaccination, administered in September, but declined pneumonia and COVID-19 vaccinations. She is due for a colonoscopy in October 2026 and a mammogram in October. She plans to switch to a new dentist for her upcoming dental cleaning.  She has glaucoma and sees a local doctor in [place] and a specialist at [facility]. Her glaucoma is stable, with an upcoming appointment with the specialist on April 1st. She also sees a retina specialist annually, with the next visit in June.  She is awaiting shoulder surgery and is currently dealing with a right shoulder problem. She is not in physical therapy and is waiting for clearance from neurology and cardiology. She had an echocardiogram last week and has a follow-up appointment with her cardiologist on March 28th.  She mentions having knee issues, which occasionally stiffen, but overall, the knee is doing well. She is not currently in physical therapy and is waiting for clearance for shoulder surgery.  She experiences balance issues but reports that her headaches have improved. She is not currently in physical therapy and is awaiting clearance for shoulder surgery.  No history of diabetes, despite it being listed in her records. She regularly sees doctors for her glaucoma.  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 She is currently managing her health conditions while maintaining regular medical appointments.      Patient Active Problem List   Diagnosis Date Noted  . Dizziness 09/09/2023  . Acute pain of right shoulder 09/09/2023  . Aortic stenosis, moderate 04/16/2023  . Coronary artery calcification 04/16/2023  . Acute medial meniscal tear 04/07/2023  . Preventative health care 03/30/2021  . Acute right ankle pain 01/03/2021  . Mid-back pain, acute 01/03/2021  . Numbness and tingling of right arm 01/03/2021  . Chronic pain syndrome 08/21/2020  . Chronic pain of both knees 08/21/2020  . Gastroesophageal reflux disease 08/21/2020  . Chest pain 08/21/2020  . Migraine   . Right anterior knee pain 05/15/2017  . Plantar fasciitis 05/15/2017  . Essential hypertension 04/11/2016  . Hyperlipidemia 03/30/2015  . GERD (gastroesophageal reflux disease) 03/30/2015  . Elevated liver enzymes 03/30/2015  . Posterior vitreous detachment 11/15/2014  . LVH (left ventricular hypertrophy) 03/28/2014  . Elevated blood pressure 03/28/2014  . Heart murmur, systolic 03/28/2014  . Glaucoma suspect 06/09/2012  . Chronic glaucoma 06/09/2012  . Primary open angle glaucoma 06/09/2012  . 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 Synovial cyst of lumbar facet joint 08/29/2011  . Lumbar foraminal stenosis 08/29/2011   Past Medical History:  Diagnosis Date  . Abnormal pap 1989   cryo  . Arthritis   . Cataract   . Chicken pox   . Endometriosis   . Fibroid 1994   History of post  uterine  . Gallstones   . GERD (gastroesophageal reflux disease)   . Glaucoma   . Headache(784.0)   . Hyperlipidemia   . Hypertension       . Migraine   . PONV (postoperative nausea and vomiting)   . 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 Tibia fracture    Right leg.     Past Surgical History:  Procedure Laterality Date  . APPENDECTOMY  75   . BREAST EXCISIONAL BIOPSY Right   . CHOLECYSTECTOMY  88  . DIAGNOSTIC LAPAROSCOPY  88   endometrious  . GLAUCOMA SURGERY Left 2019  . KNEE ARTHROSCOPY Left 04/07/2023   Procedure: Left knee arthroscopy; medial meniscal debridement, chondroplasty;  Surgeon: Ollen Gross, MD;  Location: [facility];  Service: Orthopedics;  Laterality: Left;   . LUMBAR FUSION  08/2011   l 3-4  . LUMBAR FUSION     l 4-5  . MENISCUS DEBRIDEMENT Left 04/07/2023   Procedure: DEBRIDEMENT OF MENISCUS;  Surgeon: Ollen Gross, MD;  Location: [facility];  Service: Orthopedics;  Laterality: Left;  . RIGHT OOPHORECTOMY  14   Social History   Tobacco Use  . Smoking status: Never  . Smokeless tobacco: Never  Vaping Use  . Vaping status: Never Used  Substance Use Topics  . Alcohol use: Not Currently  . Drug use: No   Social History   Socioeconomic History  . Marital status: Married    Spouse name: Not on file  . Number of children: 1  . Years of education: Not on file  . Highest education level: Not on file  Occupational History  . Occupation: Surgical coodinator  Tobacco Use  . Smoking status: Never  . Smokeless tobacco: Never  Vaping Use  . Vaping status: Never Used  Substance and Sexual Activity  . Alcohol use: Not Currently  . Drug use: No  . Sexual activity: Yes    Partners: Male    Birth control/protection: Post-menopausal  Other Topics Concern  . Not on file  Social History Narrative   Not much exercise   Social Drivers of Health   Financial Resource Strain: Low Risk  (09/08/2023)   [facility]   Overall Financial Resource Strain (CARDIA)   . Difficulty of Paying Living Expenses: Not very hard  Food Insecurity: No Food Insecurity (09/08/2023)   [facility] from [facility]   Hunger Vital Sign   . Worried About Programme researcher, broadcasting/film/video in the Last Year: Never true   . Ran Out of Food in the Last Year: Never true  Transportation Needs: No Transportation Needs (09/08/2023)    Received from [facility] - Transportation   . Lack of Transportation (Medical): No   . Lack of Transportation (Non-Medical): No  Physical Activity: Not on file  Stress: Not on file  Social Connections: Not on file  Intimate Partner Violence: Not on file   Family Status  Relation Name Status  . Mother  Deceased  . Father  Deceased at age 10  . Sister  (Not Specified)  . Sister  (Not Specified)  . Brother  (Not Specified)  . Brother  (Not Specified)  . MGM  Deceased  . MGF  Deceased  . PGM  Deceased  . PGF  Deceased  . Son  Alive  . Mat Aunt  (Not Specified)  . Mat Aunt  (Not Specified)  . Dennie Bible  Aunt  (Not Specified)  . Cousin  (Not Specified)  . Neg Hx  (Not Specified)  No partnership data on file   Family History  Problem Relation Age of Onset  . Hyperlipidemia Mother   . Hypertension Mother   . Hypertension Father   . Glaucoma Father   . Prostate cancer Father   . Renal Disease Father   . Macular degeneration Father   . Stroke Father   . Kidney disease Father   . Heart disease Father   . Kidney failure Father   . Hypertension Sister   . Hyperlipidemia Sister   . Hypertension Sister   . Hypertension Brother   . Heart disease Brother   . Hypertension Brother   . Breast cancer Maternal Grandmother   . Breast cancer Maternal Aunt   . Diabetes Maternal Aunt   . Diabetes Paternal Aunt   . Breast cancer Cousin   . Colon cancer Neg Hx   . Colon polyps Neg Hx   . Stomach cancer Neg Hx   . Esophageal cancer Neg Hx    Allergies  Allergen Reactions  . Nubain [Nalbuphine Hcl] Anaphylaxis  . Nubain [Nalbuphine Hcl] Nausea And Vomiting  . Corticosteroids Other (See Comments)    Due to glaucoma can not have steroids  . Dilaudid [Hydromorphone Hcl] Nausea And Vomiting    Severe itching  . Oxycodone-Acetaminophen Itching  . Percodan [Oxycodone-Aspirin] Hives and Itching  . Tomato Rash    Rash, spots on tongue      Review of Systems  Constitutional:   Negative for fever and malaise/fatigue.  HENT:  Negative for congestion.   Eyes:  Negative for blurred vision.  Respiratory:  Negative for cough and shortness of breath.   Cardiovascular:  Negative for chest pain, palpitations and leg swelling.  Gastrointestinal:  Negative for abdominal pain, blood in stool, nausea and vomiting.  Genitourinary:  Negative for dysuria and frequency.  Musculoskeletal:  Negative for back pain and falls.  Skin:  Negative for rash.  Neurological:  Negative for dizziness, loss of consciousness and headaches.  Endo/Heme/Allergies:  Negative for environmental allergies.  Psychiatric/Behavioral:  Negative for depression. The patient is not nervous/anxious.       Objective:     BP 126/60 (BP Location: Left Arm, Patient Position: Sitting, Cuff Size: Large)   Pulse 63   Temp 97.7 F (36.5 C) (Oral)   Resp 18   Ht 5\' 5"  (1.651 m)   Wt 220 lb 12.8 oz (100.2 kg)   LMP 09/03/2007 (Approximate)   SpO2 95%   BMI 36.74 kg/m  BP Readings from Last 3 Encounters:  11/14/23 126/60  09/09/23 (!) 140/60  06/17/23 (!) 150/100   Wt Readings from Last 3 Encounters:  11/14/23 220 lb 12.8 oz (100.2 kg)  09/09/23 223 lb (101.2 kg)  06/17/23 211 lb 12.8 oz (96.1 kg)   SpO2 Readings from Last 3 Encounters:  11/14/23 95%  09/09/23 100%  06/17/23 100%      Physical Exam Vitals and nursing note reviewed.  Constitutional:      General: She is not in acute distress.    Appearance: Normal appearance. She is well-developed.  HENT:     Head: Normocephalic and atraumatic.  Eyes:     General: No scleral icterus.       Right eye: No discharge.        Left eye: No discharge.  Cardiovascular:     Rate and Rhythm: Normal rate and regular rhythm.  Heart sounds: Murmur heard.  Pulmonary:     Effort: Pulmonary effort is normal. No respiratory distress.     Breath sounds: Normal breath sounds.  Musculoskeletal:        General: Normal range of motion.     Cervical  back: Normal range of motion and neck supple.     Right lower leg: No edema.     Left lower leg: No edema.  Skin:    General: Skin is warm and dry.  Neurological:     Mental Status: She is alert and oriented to person, place, and time.  Psychiatric:        Mood and Affect: Mood normal.        Behavior: Behavior normal.        Thought Content: Thought content normal.        Judgment: Judgment normal.     No results found for any visits on 11/14/23.  Last CBC Lab Results  Component Value Date   WBC 5.1 04/07/2023   HGB 12.1 04/07/2023   HCT 39.5 04/07/2023   MCV 89.6 04/07/2023   MCH 27.4 04/07/2023   RDW 12.2 04/07/2023   PLT 180 04/07/2023   Last metabolic panel Lab Results  Component Value Date   GLUCOSE 90 06/17/2023   NA 139 06/17/2023   K 4.0 06/17/2023   CL 102 06/17/2023   CO2 28 06/17/2023   BUN 21 06/17/2023   CREATININE 0.89 06/17/2023   GFR 69.65 06/17/2023   CALCIUM 9.7 06/17/2023   PHOS 3.6 02/28/2020   PROT 7.4 06/17/2023   ALBUMIN 4.3 06/17/2023   BILITOT 0.4 06/17/2023   ALKPHOS 87 06/17/2023   AST 20 06/17/2023   ALT 30 06/17/2023   ANIONGAP 9 04/07/2023   Last lipids Lab Results  Component Value Date   CHOL 185 05/12/2023   HDL 81.50 05/12/2023   LDLCALC 87 05/12/2023   TRIG 85.0 05/12/2023   CHOLHDL 2 05/12/2023   Last hemoglobin A1c Lab Results  Component Value Date   HGBA1C 5.1 03/30/2021   Last thyroid functions Lab Results  Component Value Date   TSH 0.74 04/02/2022   Last vitamin D No results found for: "25OHVITD2", "25OHVITD3", "VD25OH" Last vitamin B12 and Folate Lab Results  Component Value Date   VITAMINB12 1,505 (H) 08/21/2020      The 10-year ASCVD risk score (Arnett DK, et al., 2019) is: 5.8%    Assessment & Plan:   Problem List Items Addressed This Visit       Unprioritized   Chronic pain syndrome   Relevant Medications   carisoprodol (SOMA) 350 MG tablet   HYDROcodone-acetaminophen (NORCO/VICODIN)  5-325 MG tablet   Other Relevant Orders   Drug Monitoring Panel [phone] , Urine   Lumbar foraminal stenosis   Relevant Medications   carisoprodol (SOMA) 350 MG tablet   Preventative health care - Primary   Ghm utd Check labs  See AVS  Health Maintenance  Topic Date Due  . OPHTHALMOLOGY EXAM  01/07/2023  . MAMMOGRAM  06/22/2023  . COVID-19 Vaccine (3 - Moderna risk series) 11/30/2023 (Originally 03/19/2020)  . DTaP/Tdap/Td (2 - Td or Tdap) 09/22/2024  . Colonoscopy  06/29/2025  . Cervical Cancer Screening (HPV/Pap Cotest)  10/24/2026  . INFLUENZA VACCINE  Completed  . Hepatitis C Screening  Completed  . Zoster Vaccines- Shingrix  Completed  . HPV VACCINES  Aged Out  . Pneumococcal Vaccine 64-72 Years old  Discontinued  . HIV Screening  Discontinued  Relevant Orders   CBC with Differential/Platelet   Comprehensive metabolic panel   Lipid panel   TSH   Hyperlipidemia   Tolerating statin, encouraged heart healthy diet, avoid trans fats, minimize simple carbs and saturated fats. Increase exercise as tolerated       Relevant Medications   ezetimibe (ZETIA) 10 MG tablet   rosuvastatin (CRESTOR) 40 MG tablet   HYDROcodone-acetaminophen (NORCO/VICODIN) 5-325 MG tablet   Other Relevant Orders   CBC with Differential/Platelet   Comprehensive metabolic panel   Lipid panel   TSH   Heart murmur, systolic   Stable  F/u cardiology      Essential hypertension   Well controlled, no changes to meds. Encouraged heart healthy diet such as the DASH diet and exercise as tolerated.        Relevant Medications   ezetimibe (ZETIA) 10 MG tablet   rosuvastatin (CRESTOR) 40 MG tablet   Other Visit Diagnoses       High risk medication use       Relevant Orders   Drug Monitoring Panel [phone] , Urine     Assessment and Plan    Shoulder Pain   She is awaiting shoulder surgery and is not currently in physical therapy. She experiences stiffness and balance issues but manages  without therapy. Awaiting clearance from neurology and possibly cardiology for surgery. Shoulder surgery will involve a block for pain management, potentially immobilizing the arm for six weeks. The block may reduce the need for pain medication post-surgery, but individual responses vary.  Back Pain   Occasional back pain is managed with stretches and reports her back is generally well-managed.  Knee Pain   Her knee is generally well-managed, though it occasionally stiffens. She previously received a block for knee pain, with sensation returning after three months.  Cardiac Murmur   She has a mild cardiac murmur. An echocardiogram was performed last week, and a cardiology follow-up is scheduled for March 28th. She has not experienced dizziness, lightheadedness, or chest pain. Symptoms such as dizziness, lightheadedness, shortness of breath, or chest pain could indicate worsening of the condition or a myocardial infarction.  Glaucoma   She is under regular care for glaucoma with a local eye doctor and a specialist at [facility]. Her condition is well-managed, with an upcoming appointment on April 1st.  General Health Maintenance   She is up to date with her flu vaccination (September). She declined pneumonia and COVID-19 vaccinations. Colonoscopy is due in October 2026, and mammogram in October 2025. She requires a dental cleaning and is switching dentists. Perform routine labs including cholesterol, blood sugar, hemoglobin, and thyroid function tests. Administer urine drug test and complete pain management contract. Schedule follow-up in six months.        Return in about 6 months (around 05/16/2024), or if symptoms worsen or fail to improve, for hypertension, hyperlipidemia.    Donato Schultz, DO

## 2023-11-14 NOTE — Assessment & Plan Note (Signed)
 Tolerating statin, encouraged heart healthy diet, avoid trans fats, minimize simple carbs and saturated fats. Increase exercise as tolerated

## 2023-11-14 NOTE — Assessment & Plan Note (Signed)
 Well controlled, no changes to meds. Encouraged heart healthy diet such as the DASH diet and exercise as tolerated.

## 2023-11-14 NOTE — Patient Instructions (Signed)
 Preventive Care 16-63 Years Old, Female  Preventive care refers to lifestyle choices and visits with your health care provider that can promote health and wellness. Preventive care visits are also called wellness exams.  What can I expect for my preventive care visit?  Counseling  Your health care provider may ask you questions about your:  Medical history, including:  Past medical problems.  Family medical history.  Pregnancy history.  Current health, including:  Menstrual cycle.  Method of birth control.  Emotional well-being.  Home life and relationship well-being.  Sexual activity and sexual health.  Lifestyle, including:  Alcohol, nicotine or tobacco, and drug use.  Access to firearms.  Diet, exercise, and sleep habits.  Work and work Astronomer.  Sunscreen use.  Safety issues such as seatbelt and bike helmet use.  Physical exam  Your health care provider will check your:  Height and weight. These may be used to calculate your BMI (body mass index). BMI is a measurement that tells if you are at a healthy weight.  Waist circumference. This measures the distance around your waistline. This measurement also tells if you are at a healthy weight and may help predict your risk of certain diseases, such as type 2 diabetes and high blood pressure.  Heart rate and blood pressure.  Body temperature.  Skin for abnormal spots.  What immunizations do I need?    Vaccines are usually given at various ages, according to a schedule. Your health care provider will recommend vaccines for you based on your age, medical history, and lifestyle or other factors, such as travel or where you work.  What tests do I need?  Screening  Your health care provider may recommend screening tests for certain conditions. This may include:  Lipid and cholesterol levels.  Diabetes screening. This is done by checking your blood sugar (glucose) after you have not eaten for a while (fasting).  Pelvic exam and Pap test.  Hepatitis B test.  Hepatitis C  test.  HIV (human immunodeficiency virus) test.  STI (sexually transmitted infection) testing, if you are at risk.  Lung cancer screening.  Colorectal cancer screening.  Mammogram. Talk with your health care provider about when you should start having regular mammograms. This may depend on whether you have a family history of breast cancer.  BRCA-related cancer screening. This may be done if you have a family history of breast, ovarian, tubal, or peritoneal cancers.  Bone density scan. This is done to screen for osteoporosis.  Talk with your health care provider about your test results, treatment options, and if necessary, the need for more tests.  Follow these instructions at home:  Eating and drinking    Eat a diet that includes fresh fruits and vegetables, whole grains, lean protein, and low-fat dairy products.  Take vitamin and mineral supplements as recommended by your health care provider.  Do not drink alcohol if:  Your health care provider tells you not to drink.  You are pregnant, may be pregnant, or are planning to become pregnant.  If you drink alcohol:  Limit how much you have to 0-1 drink a day.  Know how much alcohol is in your drink. In the [place] one drink equals one 12 oz bottle of beer (355 mL), one 5 oz glass of wine (148 mL), or one 1 oz glass of hard liquor (44 mL).  Lifestyle  Brush your teeth every morning and night with fluoride toothpaste. Floss one time each day.  Exercise for at least  30 minutes 5 or more days each week.  Do not use any products that contain nicotine or tobacco. These products include cigarettes, chewing tobacco, and vaping devices, such as e-cigarettes. If you need help quitting, ask your health care provider.  Do not use drugs.  If you are sexually active, practice safe sex. Use a condom or other form of protection to prevent STIs.  If you do not wish to become pregnant, use a form of birth control. If you plan to become pregnant, see your health care provider for a  prepregnancy visit.  Take aspirin only as told by your health care provider. Make sure that you understand how much to take and what form to take. Work with your health care provider to find out whether it is safe and beneficial for you to take aspirin daily.  Find healthy ways to manage stress, such as:  Meditation, yoga, or listening to music.  Journaling.  Talking to a trusted person.  Spending time with friends and family.  Minimize exposure to UV radiation to reduce your risk of skin cancer.  Safety  Always wear your seat belt while driving or riding in a vehicle.  Do not drive:  If you have been drinking alcohol. Do not ride with someone who has been drinking.  When you are tired or distracted.  While texting.  If you have been using any mind-altering substances or drugs.  Wear a helmet and other protective equipment during sports activities.  If you have firearms in your house, make sure you follow all gun safety procedures.  Seek help if you have been physically or sexually abused.  What's next?  Visit your health care provider once a year for an annual wellness visit.  Ask your health care provider how often you should have your eyes and teeth checked.  Stay up to date on all vaccines.  This information is not intended to replace advice given to you by your health care provider. Make sure you discuss any questions you have with your health care provider.  Document Revised: 02/14/2021 Document Reviewed: 02/14/2021  Elsevier Patient Education  2024 [employer].

## 2023-11-14 NOTE — Assessment & Plan Note (Signed)
 Ghm utd Check labs  See AVS  Health Maintenance  Topic Date Due   OPHTHALMOLOGY EXAM  01/07/2023   MAMMOGRAM  06/22/2023   COVID-19 Vaccine (3 - Moderna risk series) 11/30/2023 (Originally 03/19/2020)   DTaP/Tdap/Td (2 - Td or Tdap) 09/22/2024   Colonoscopy  06/29/2025   Cervical Cancer Screening (HPV/Pap Cotest)  10/24/2026   INFLUENZA VACCINE  Completed   Hepatitis C Screening  Completed   Zoster Vaccines- Shingrix  Completed   HPV VACCINES  Aged Out   Pneumococcal Vaccine 51-30 Years old  Discontinued   HIV Screening  Discontinued

## 2023-11-17 LAB — DRUG MONITORING PANEL 376104, URINE
Amphetamines: NEGATIVE ng/mL (ref <500)
Barbiturates: NEGATIVE ng/mL (ref <300)
Benzodiazepines: NEGATIVE ng/mL (ref <100)
Cocaine Metabolite: NEGATIVE ng/mL (ref <150)
Desmethyltramadol: NEGATIVE ng/mL (ref <100)
Opiates: NEGATIVE ng/mL (ref <100)
Oxycodone: NEGATIVE ng/mL (ref <100)
Tramadol: NEGATIVE ng/mL (ref <100)

## 2023-11-17 LAB — DM TEMPLATE

## 2023-11-21 ENCOUNTER — Encounter: Payer: Self-pay | Admitting: Family Medicine

## 2023-11-28 ENCOUNTER — Ambulatory Visit: Payer: No Typology Code available for payment source | Attending: Internal Medicine | Admitting: Internal Medicine

## 2023-11-28 VITALS — BP 112/70 | HR 71 | Ht 65.0 in | Wt 218.8 lb

## 2023-11-28 DIAGNOSIS — I350 Nonrheumatic aortic (valve) stenosis: Secondary | ICD-10-CM | POA: Diagnosis not present

## 2023-11-28 DIAGNOSIS — E782 Mixed hyperlipidemia: Secondary | ICD-10-CM | POA: Diagnosis not present

## 2023-11-28 DIAGNOSIS — I2510 Atherosclerotic heart disease of native coronary artery without angina pectoris: Secondary | ICD-10-CM | POA: Diagnosis not present

## 2023-11-28 DIAGNOSIS — I10 Essential (primary) hypertension: Secondary | ICD-10-CM | POA: Diagnosis not present

## 2023-11-28 NOTE — Patient Instructions (Addendum)
 Medication Instructions:  Your physician recommends that you continue on your current medications as directed. Please refer to the Current Medication list given to you today.  *If you need a refill on your cardiac medications before your next appointment, please call your pharmacy*   Lab Work: NONE If you have labs (blood work) drawn today and your tests are completely normal, you will receive your results only by: MyChart Message (if you have MyChart) OR A paper copy in the mail If you have any lab test that is abnormal or we need to change your treatment, we will call you to review the results.   Testing/Procedures: [facility] 2026- - - Your physician has requested that you have an echocardiogram. Echocardiography is a painless test that uses sound waves to create images of your heart. It provides your doctor with information about the size and shape of your heart and how well your heart's chambers and valves are working. This procedure takes approximately one hour. There are no restrictions for this procedure. Please do NOT wear cologne, perfume, aftershave, or lotions (deodorant is allowed). Please arrive 15 minutes prior to your appointment time.  Please note: We ask at that you not bring children with you during ultrasound (echo/ vascular) testing. Due to room size and safety concerns, children are not allowed in the ultrasound rooms during exams. Our front office staff cannot provide observation of children in our lobby area while testing is being conducted. An adult accompanying a patient to their appointment will only be allowed in the ultrasound room at the discretion of the ultrasound technician under special circumstances. We apologize for any inconvenience.    Follow-Up: At [facility], you and your health needs are our priority.  As part of our continuing mission to provide you with exceptional heart care, we have created designated Provider Care Teams.  These Care Teams  include your primary Cardiologist (physician) and Advanced Practice Providers (APPs -  Physician Assistants and Nurse Practitioners) who all work together to provide you with the care you need, when you need it.   Your next appointment:   1 year(s)  Provider:   Riley Lam, MD    1st Floor: - Lobby - Registration  - Pharmacy  - Lab - Cafe  2nd Floor: - PV Lab - Diagnostic Testing (echo, CT, nuclear med)  3rd Floor: - Vacant  4th Floor: - TCTS (cardiothoracic surgery) - AFib Clinic - Structural Heart Clinic - Vascular Surgery  - Vascular Ultrasound  5th Floor: - [facility] (general and EP) - Clinical Pharmacy for coumadin, hypertension, lipid, weight-loss medications, and med management appointments    Valet parking services will be available as well.

## 2023-11-28 NOTE — Progress Notes (Signed)
 Cardiology Office Note:    Date:  11/28/2023   ID:  Yvonne Sanders, DOB [birth year], MRN [id number]  PCP:  Zola Button, Grayling Congress, DO  Cardiologist:  Christell Constant, MD   Referring MD: Zola Button, Grayling Congress, *   CC: Pre-operative risk stratification  History of Present Illness:    Yvonne Sanders is a 63 y.o. female with a hx of moderate  aortic valve stenosis, high risk coronary calcium score (597), family history of vascular disease and FHX end-stage kidney disease, essential hypertension, and LVH.  Last seen by Dr. Katrinka Blazing in 2023. 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  They are from [place] Susann Givens) 2024: did well with knee surgery.  Yvonne Sanders is a 63 year old female with moderate aortic stenosis who presents for cardiac evaluation prior to surgery.  She has a history of moderate aortic stenosis, first noted in 2024. Echocardiograms, including the most recent on November 06, 2023, confirm stable moderate aortic stenosis. The valve morphology is unclear, but there is suspicion of a bicuspid valve due to her age and calcification. She is asymptomatic with no chest pain, shortness of breath, or syncope, and remains active, using an exercise bike without symptoms.  She has hyperlipidemia and coronary artery calcifications, with a calcium score of 597, placing her in the 90th percentile. Her blood pressure is well controlled, but cholesterol levels are higher than desired. She is on a medication regimen to manage these conditions, although some medications do not agree with her.  She is dealing with orthopedic issues, including a recent knee surgery and a shoulder injury from a cabinet falling on her. She experiences balance issues and headaches, which have improved but still limit her physical activities. She is awaiting a neurosurgery consultation for spinal issues, scheduled for next week. Her ability to perform physical activities is limited due to these non-cardiac  issues.   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      Past Surgical History:  Procedure Laterality Date   APPENDECTOMY  58   BREAST EXCISIONAL BIOPSY Right    CHOLECYSTECTOMY  88   DIAGNOSTIC LAPAROSCOPY  88   endometrious   GLAUCOMA SURGERY Left 2019   KNEE ARTHROSCOPY Left 04/07/2023   Procedure: Left knee arthroscopy; medial meniscal debridement, chondroplasty;  Surgeon: Ollen Gross, MD;  Location: [facility];  Service: Orthopedics;  Laterality: Left;    LUMBAR FUSION  08/2011   l 3-4   LUMBAR FUSION     l 4-5   MENISCUS DEBRIDEMENT Left 04/07/2023   Procedure: DEBRIDEMENT OF MENISCUS;  Surgeon: Ollen Gross, MD;  Location: [facility];  Service: Orthopedics;  Laterality: Left;   RIGHT OOPHORECTOMY  75    Current Medications: Current Meds  Medication Sig   acetaminophen (TYLENOL) 500 MG tablet Take 1 tablet (500 mg total) by mouth every 6 (  six) hours as needed.   amitriptyline (ELAVIL) 10 MG tablet Take 10 mg by mouth at bedtime.   b complex vitamins capsule Take 1 capsule by mouth 2 (two) times a week.   Biotin 5000 MCG CAPS Take 5,000 mcg by mouth daily.   Brinzolamide-Brimonidine (SIMBRINZA) 1-0.2 % SUSP Place 1 drop into both eyes 3 (three) times daily.   Brinzolamide-Brimonidine 1-0.2 % SUSP Place 1 drop into both eyes 3 (three) times daily.    Calcium Carbonate Antacid (TUMS PO) Take 1 tablet by mouth daily as needed (heartburn).    carboxymethylcellul-glycerin (OPTIVE) 0.5-0.9 % ophthalmic solution Place 1 drop into both eyes as  needed.   carisoprodol (SOMA) 350 MG tablet 1 po qd pr   Coenzyme Q10 100 MG TABS Take 100 mg by mouth daily.   cycloSPORINE (RESTASIS) 0.05 % ophthalmic emulsion Place 1 drop into both eyes 2 (two) times daily.   esomeprazole (NEXIUM) 40 MG capsule Take 1 capsule (40 mg total) by mouth daily.   Evolocumab (REPATHA SURECLICK) 140 MG/ML SOAJ INJECT 1 ML INTO THE SKIN EVERY 14 DAYS   ezetimibe (ZETIA) 10 MG tablet Take 1 tablet (10 mg total) by mouth daily.   HYDROcodone-acetaminophen (NORCO/VICODIN) 5-325 MG tablet Take 1 tablet by mouth every 6 (six) hours as needed for moderate pain (pain score 4-6).   losartan-hydrochlorothiazide (HYZAAR) 100-25 MG tablet Take 1 tablet by mouth daily.   MAGNESIUM PO Take 420 mg by mouth daily.   meloxicam (MOBIC) 15 MG tablet Take 1 tablet (15 mg total) by mouth daily.   methocarbamol (ROBAXIN) 500 MG tablet Take 500 mg by mouth every 6 (six) hours as needed.   metoprolol succinate (TOPROL-XL) 50 MG 24 hr tablet TAKE 1 TABLET BY MOUTH ONCE DAILY WITH  OR  IMMEDIATELY  FOLLOWING  A  MEAL   Multiple Vitamins-Minerals (MULTIVITAMINS THER. W/MINERALS) TABS Take 1 tablet by mouth daily.   Netarsudil-Latanoprost (ROCKLATAN) 0.02-0.005 % SOLN 2 drops at bedtime.   Netarsudil-Latanoprost 0.02-0.005 % SOLN Place 1 drop into both eyes at bedtime.   NONFORMULARY OR COMPOUNDED ITEM Tumeric 1000mg  daily   ondansetron (ZOFRAN) 4 MG tablet Take 1 tablet (4 mg total) by mouth every 6 (six) hours.   rosuvastatin (CRESTOR) 40 MG tablet Take 1 tablet (40 mg total) by mouth daily.     Allergies:   Nubain [nalbuphine hcl], Nubain [nalbuphine hcl], Corticosteroids, Dilaudid [hydromorphone hcl], Oxycodone-acetaminophen, Percodan [oxycodone-aspirin], and Tomato   Social History   Socioeconomic History   Marital status: Married    Spouse name: Not on file   Number of children: 1   Years of education: Not on file   Highest education level: Not on file  Occupational History    Occupation: Surgical coodinator  Tobacco Use   Smoking status: Never   Smokeless tobacco: Never  Vaping Use   Vaping status: Never Used  Substance and Sexual Activity   Alcohol use: Not Currently   Drug use: No   Sexual activity: Yes    Partners: Male    Birth control/protection: Post-menopausal  Other Topics Concern   Not on file  Social History Narrative   Not much exercise   Social Drivers of Health   Financial Resource Strain: Low Risk  (09/08/2023)   Received from [facility]   Overall Financial Resource Strain (CARDIA)    Difficulty of Paying Living Expenses: Not very hard  Food Insecurity: No Food Insecurity (09/08/2023)   Received from [facility]   Hunger Vital Sign  Worried About Programme researcher, broadcasting/film/video in the Last Year: Never true    Ran Out of Food in the Last Year: Never true  Transportation Needs: No Transportation Needs (09/08/2023)   Received from [facility] - [facility]    Lack of Transportation (Medical): No    Lack of Transportation (Non-Medical): No  Physical Activity: Not on file  Stress: Not on file  Social Connections: Not on file     Family History: The patient's family history includes Breast cancer in her cousin, maternal aunt, and maternal grandmother; Diabetes in her maternal aunt and paternal aunt; Glaucoma in her father; Heart disease in her brother and father; Hyperlipidemia in her mother and sister; Hypertension in her brother, brother, father, mother, sister, and sister; Kidney disease in her father; Kidney failure in her father; Macular degeneration in her father; Prostate cancer in her father; Renal Disease in her father; Stroke in her father. There is no history of Colon cancer, Colon polyps, Stomach cancer, or Esophageal cancer.  ROS:   Please see the history of present illness.     EKGs/Labs/Other Studies Reviewed:    The following studies were reviewed today:  Cardiac Studies & Procedures    ______________________________________________________________________________________________     ECHOCARDIOGRAM  ECHOCARDIOGRAM COMPLETE 11/06/2023  Narrative ECHOCARDIOGRAM REPORT    Patient Name:   CLARABEL MARION Date of Exam: 11/06/2023 Medical Rec #:  [id number]        Height:       65.0 in Accession #:    [id number]       Weight:       223.0 lb Date of Birth:  Feb 16, 1961        BSA:          2.072 m Patient Age:    62 years         BP:           140/60 mmHg Patient Gender: F                HR:           60 bpm. Exam Location:  [address]  Procedure: 2D Echo, Cardiac Doppler and Color Doppler (Both Spectral and Color Flow Doppler were utilized during procedure).  Indications:    I35.0 AS  History:        Patient has prior history of Echocardiogram examinations, most recent 11/21/2022. LVH, AS; Risk Factors:Hypertension, Dyslipidemia and Obesity.  Sonographer:    Samule Ohm RDCS Referring Phys: [id number] [facility] A Jashira Cotugno  IMPRESSIONS   1. Left ventricular ejection fraction, by estimation, is 60 to 65%. The left ventricle has normal function. The left ventricle has no regional wall motion abnormalities. There is mild concentric left ventricular hypertrophy. Left ventricular diastolic parameters are consistent with Grade I diastolic dysfunction (impaired relaxation). 2. Right ventricular systolic function is normal. The right ventricular size is normal. There is normal pulmonary artery systolic pressure. The estimated right ventricular systolic pressure is 30.0 mmHg. 3. The mitral valve is normal in structure. Mild mitral valve regurgitation. No evidence of mitral stenosis. 4. The aortic valve was not well visualized. There is severe calcifcation of the aortic valve. Aortic valve regurgitation is trivial. Moderate aortic valve stenosis. Aortic valve area, by VTI measures 1.24 cm. Aortic valve mean gradient measures 24.0 mmHg. 5. The inferior vena cava is normal  in size with greater than 50% respiratory variability, suggesting right atrial pressure of 3 mmHg.  FINDINGS Left Ventricle: Left ventricular ejection  fraction, by estimation, is 60 to 65%. The left ventricle has normal function. The left ventricle has no regional wall motion abnormalities. The left ventricular internal cavity size was normal in size. There is mild concentric left ventricular hypertrophy. Left ventricular diastolic parameters are consistent with Grade I diastolic dysfunction (impaired relaxation).  Right Ventricle: The right ventricular size is normal. No increase in right ventricular wall thickness. Right ventricular systolic function is normal. There is normal pulmonary artery systolic pressure. The tricuspid regurgitant velocity is 2.60 m/s, and with an assumed right atrial pressure of 3 mmHg, the estimated right ventricular systolic pressure is 30.0 mmHg.  Left Atrium: Left atrial size was normal in size.  Right Atrium: Right atrial size was normal in size.  Pericardium: There is no evidence of pericardial effusion.  Mitral Valve: The mitral valve is normal in structure. Mild mitral valve regurgitation. No evidence of mitral valve stenosis.  Tricuspid Valve: The tricuspid valve is normal in structure. Tricuspid valve regurgitation is trivial.  Aortic Valve: The aortic valve was not well visualized. There is severe calcifcation of the aortic valve. Aortic valve regurgitation is trivial. Moderate aortic stenosis is present. Aortic valve mean gradient measures 24.0 mmHg. Aortic valve peak gradient measures 46.0 mmHg. Aortic valve area, by VTI measures 1.24 cm.  Pulmonic Valve: The pulmonic valve was normal in structure. Pulmonic valve regurgitation is not visualized.  Aorta: The aortic root is normal in size and structure.  Venous: The inferior vena cava is normal in size with greater than 50% respiratory variability, suggesting right atrial pressure of 3  mmHg.  IAS/Shunts: No atrial level shunt detected by color flow Doppler.   LEFT VENTRICLE PLAX 2D LVIDd:         3.70 cm   Diastology LVIDs:         2.40 cm   LV e' medial:    11.10 cm/s LV PW:         1.10 cm   LV E/e' medial:  8.3 LV IVS:        1.40 cm   LV e' lateral:   13.10 cm/s LVOT diam:     2.00 cm   LV E/e' lateral: 7.0 LV SV:         91 LV SV Index:   44 LVOT Area:     3.14 cm   RIGHT VENTRICLE             IVC RV S prime:     12.20 cm/s  IVC diam: 1.10 cm TAPSE (M-mode): 2.4 cm RVSP:           30.0 mmHg  LEFT ATRIUM             Index        RIGHT ATRIUM           Index LA diam:        4.00 cm 1.93 cm/m   RA Pressure: 3.00 mmHg LA Vol (A2C):   54.0 ml 26.06 ml/m  RA Area:     13.90 cm LA Vol (A4C):   50.9 ml 24.57 ml/m  RA Volume:   32.60 ml  15.74 ml/m LA Biplane Vol: 53.2 ml 25.68 ml/m AORTIC VALVE AV Area (Vmax):    1.15 cm AV Area (Vmean):   1.14 cm AV Area (VTI):     1.24 cm AV Vmax:           339.00 cm/s AV Vmean:  226.000 cm/s AV VTI:            0.731 m AV Peak Grad:      46.0 mmHg AV Mean Grad:      24.0 mmHg LVOT Vmax:         124.00 cm/s LVOT Vmean:        82.200 cm/s LVOT VTI:          0.289 m LVOT/AV VTI ratio: 0.40  AORTA Ao Asc diam: 3.20 cm  MITRAL VALVE                TRICUSPID VALVE MV Area (PHT): 3.53 cm     TR Peak grad:   27.0 mmHg MV Decel Time: 215 msec     TR Vmax:        260.00 cm/s MV E velocity: 92.20 cm/s   Estimated RAP:  3.00 mmHg MV A velocity: 116.00 cm/s  RVSP:           30.0 mmHg MV E/A ratio:  0.79 SHUNTS Systemic VTI:  0.29 m Systemic Diam: 2.00 cm  Dalton McleanMD Electronically signed by Wilfred Lacy Signature Date/Time: 11/06/2023/2:23:53 PM    Final      CT SCANS  CT CARDIAC SCORING (SELF PAY ONLY) 10/19/2020  Addendum 10/19/2020  9:12 PM ADDENDUM REPORT: 10/19/2020 21:09  CLINICAL DATA:  Risk stratification  EXAM: Coronary Calcium Score  TECHNIQUE: The patient was scanned  on a [employer] scanner. Axial non-contrast 3 mm slices were carried out through the heart. The data set was analyzed on a dedicated work station and scored using the Agatson method.  FINDINGS: Non-cardiac: See separate report from [facility].  Ascending Aorta: Severe calcifications of the aortic valve and aortic root. Normal caliber.  Pericardium: Normal.  Coronary arteries: Normal origin.  IMPRESSION: 1. Coronary calcium score of 597. This was 67 percentile for age and sex matched control.  2. Severe calcifications of the aortic valve (calcium score 3654) and of the aortic root. Consider an echocardiogram for evaluation of severity of aortic stenosis.   Electronically Signed By: Tobias Alexander On: 10/19/2020 21:09  Narrative EXAM: OVER-READ INTERPRETATION  CT CHEST  The following report is an over-read performed by radiologist Dr. Jeronimo Greaves of [facility] on 10/19/2020. This over-read does not include interpretation of cardiac or coronary anatomy or pathology. The calcium score interpretation by the cardiologist is attached.  COMPARISON:  01/15/2016 plain film.  FINDINGS: Vascular: Hyperattenuation within the proximal ascending aorta is favored to be due to calcification rather than prior replacement. Mild descending thoracic calcified atherosclerosis.  Mediastinum/Nodes: No imaged thoracic adenopathy. Small to moderate hiatal hernia.  Lungs/Pleura: No pleural fluid. Left lower lobe calcified granuloma.  Upper Abdomen: Normal imaged portions of the liver, spleen.  Musculoskeletal: Midthoracic spondylosis.  IMPRESSION: 1.  No acute findings in the imaged extracardiac chest. 2.  Aortic Atherosclerosis (ICD10-I70.0). 3. Hiatal hernia.  Electronically Signed: By: Jeronimo Greaves M.D. On: 10/19/2020 16:44     ______________________________________________________________________________________________       Recent  Labs: 11/14/2023: ALT 49; BUN 12; Creatinine, Ser 0.69; Hemoglobin 11.6; Platelets 262.0; Potassium 3.9; Sodium 141; TSH 1.03  Recent Lipid Panel    Component Value Date/Time   CHOL 163 11/14/2023 0921   TRIG 124.0 11/14/2023 0921   HDL 61.50 11/14/2023 0921   CHOLHDL 3 11/14/2023 0921   VLDL 24.8 11/14/2023 0921   LDLCALC 76 11/14/2023 0921    Physical Exam:    VS:  BP 112/70 (BP  Location: Left Arm)   Pulse 71   Ht 5\' 5"  (1.651 m)   Wt 99.2 kg   LMP 09/03/2007 (Approximate)   SpO2 97%   BMI 36.41 kg/m     Wt Readings from Last 3 Encounters:  11/28/23 99.2 kg  11/14/23 100.2 kg  09/09/23 101.2 kg     GEN: No acute distress NECK: No JVD. CARDIAC: 3/6 harsh systolic murmur, RRR  RESPIRATORY:  Clear to auscultation without rales, wheezing or rhonchi  ABDOMEN: Soft, non-tender, non-distended, No pulsatile mass, MUSCULOSKELETAL: No deformity  SKIN: Warm and dry NEUROLOGIC:  Alert and oriented x 3 PSYCHIATRIC:  Normal affect   ASSESSMENT:    1. Aortic stenosis, moderate   2. Coronary artery calcification   3. Mixed hyperlipidemia   4. Essential hypertension      PLAN:    Moderate Aortic Stenosis Moderate aortic stenosis confirmed by echocardiogram. Valve morphology not clearly visualized; suspicion of bicuspid valve due to age and calcification. Asymptomatic with no chest pain or dyspnea, maintaining reasonable physical activity. Moderate surgical risk due to aortic stenosis and coronary artery calcifications, but well compensated. Reasonable to proceed to surgery - Send a courtesy message to Dr. Franky Macho at [facility] regarding cardiac status  Left Ventricular Hypertrophy EKG shows left ventricular hypertrophy, expected with aortic stenosis. No signs of acute cardiac issues. - likely related to AS  Coronary Artery Calcifications Coronary artery calcium score of 597, 90th percentile. Asymptomatic with no ischemia during physical  activity. Hypertension well controlled; hyperlipidemia likely due to decreased physical activity from orthopedic issues. - Continue current medication regimen for hypertension and hyperlipidemia management  Hyperlipidemia Cholesterol levels higher than desired, possibly influenced by current physical activity level. On reasonable medication regimen for hyperlipidemia management. - Continue current medication regimen for hyperlipidemia management - Encourage increased physical activity as orthopedic issues allow - if not, can discuss bempedoic acid or inclisiran in the future; this may be the lowest we can get (which is why she may actually be tri-leaflet calcific AS from FH)  Medication Adjustments/Labs and Tests Ordered: Current medicines are reviewed at length with the patient today.  Concerns regarding medicines are outlined above.  Orders Placed This Encounter  Procedures   EKG 12-Lead   No orders of the defined types were placed in this encounter.   There are no Patient Instructions on file for this visit.   Signed, Christell Constant, MD  11/28/2023 11:15 AM    [facility]

## 2023-12-02 LAB — HM DIABETES EYE EXAM

## 2023-12-12 ENCOUNTER — Other Ambulatory Visit: Payer: Self-pay | Admitting: Internal Medicine

## 2023-12-12 DIAGNOSIS — E7849 Other hyperlipidemia: Secondary | ICD-10-CM

## 2023-12-25 ENCOUNTER — Other Ambulatory Visit: Payer: Self-pay | Admitting: Neurosurgery

## 2024-01-02 NOTE — Pre-Procedure Instructions (Signed)
 Surgical Instructions   Your procedure is scheduled on Wednesday, Jan 14, 2024. Report to [facility] Main Entrance "A" at 6:30 A.M., then check in with the Admitting office. Any questions or running late day of surgery: call [phone]  Questions prior to your surgery date: call [phone], Monday-Friday, 8am-4pm. If you experience any cold or flu symptoms such as cough, fever, chills, shortness of breath, etc. between now and your scheduled surgery, please notify us  at the above number.     Remember:  Do not eat or drink after midnight the night before your surgery    Take these medicines the morning of surgery with A SIP OF WATER : Brinzolamide-Brimonide (Simbrinza) eye drops Cyclosprorine (Restasis) eye drops Esomeprazole  (Nexium ) Ezetimibe  (Zetia ) Metoprolol  Succinate (Toprol -XL) Rosuvastatin  (Crestor )  May take these medicines IF NEEDED: Acetaminophen  (Tylenol ) Carboxymethylcellul-glycerin (Optive) eye drops Carisoprodol  (Soma ) Hydrocodone -Acetaminophen  (Norco) Methocarbamol (Robaxin) Ondansetron  (Zofran )  One week prior to surgery, STOP taking any Aspirin  (unless otherwise instructed by your surgeon) Meloxicam  (Mobic ), Aleve, Naproxen, Ibuprofen , Motrin , Advil , Goody's, BC's, all herbal medications, fish oil, and non-prescription vitamins.                     Do NOT Smoke (Tobacco/Vaping) for 24 hours prior to your procedure.  If you use a CPAP at night, you may bring your mask/headgear for your overnight stay.   You will be asked to remove any contacts, glasses, piercing's, hearing aid's, dentures/partials prior to surgery. Please bring cases for these items if needed.    Patients discharged the day of surgery will not be allowed to drive home, and someone needs to stay with them for 24 hours.  SURGICAL WAITING ROOM VISITATION Patients may have no more than 2 support people in the waiting area - these visitors may rotate.   Pre-op nurse will coordinate an  appropriate time for 1 ADULT support person, who may not rotate, to accompany patient in pre-op.  Children under the age of 23 must have an adult with them who is not the patient and must remain in the main waiting area with an adult.  If the patient needs to stay at the hospital during part of their recovery, the visitor guidelines for inpatient rooms apply.  Please refer to the [facility] website for the visitor guidelines for any additional information.   If you received a COVID test during your pre-op visit  it is requested that you wear a mask when out in public, stay away from anyone that may not be feeling well and notify your surgeon if you develop symptoms. If you have been in contact with anyone that has tested positive in the last 10 days please notify you surgeon.      Pre-operative 5 CHG Bathing Instructions   You can play a key role in reducing the risk of infection after surgery. Your skin needs to be as free of germs as possible. You can reduce the number of germs on your skin by washing with CHG (chlorhexidine  gluconate) soap before surgery. CHG is an antiseptic soap that kills germs and continues to kill germs even after washing.   DO NOT use if you have an allergy to chlorhexidine /CHG or antibacterial soaps. If your skin becomes reddened or irritated, stop using the CHG and notify one of our RNs at [phone].   Please shower with the CHG soap starting 4 days before surgery using the following schedule:     Please keep in mind the following:  DO NOT  shave, including legs and underarms, starting the day of your first shower.   You may shave your face at any point before/day of surgery.  Place clean sheets on your bed the day you start using CHG soap. Use a clean washcloth (not used since being washed) for each shower. DO NOT sleep with pets once you start using the CHG.   CHG Shower Instructions:  Wash your face and private area with normal soap. If you choose to  wash your hair, wash first with your normal shampoo.  After you use shampoo/soap, rinse your hair and body thoroughly to remove shampoo/soap residue.  Turn the water OFF and apply about 3 tablespoons (45 ml) of CHG soap to a CLEAN washcloth.  Apply CHG soap ONLY FROM YOUR NECK DOWN TO YOUR TOES (washing for 3-5 minutes)  DO NOT use CHG soap on face, private areas, open wounds, or sores.  Pay special attention to the area where your surgery is being performed.  If you are having back surgery, having someone wash your back for you may be helpful. Wait 2 minutes after CHG soap is applied, then you may rinse off the CHG soap.  Pat dry with a clean towel  Put on clean clothes/pajamas   If you choose to wear lotion, please use ONLY the CHG-compatible lotions that are listed below.  Additional instructions for the day of surgery: DO NOT APPLY any lotions, deodorants, cologne, or perfumes.   Do not bring valuables to the hospital. [facility] is not responsible for any belongings/valuables. Do not wear nail polish, gel polish, artificial nails, or any other type of covering on natural nails (fingers and toes) Do not wear jewelry or makeup Put on clean/comfortable clothes.  Please brush your teeth.  Ask your nurse before applying any prescription medications to the skin.     CHG Compatible Lotions   Aveeno Moisturizing lotion  Cetaphil Moisturizing Cream  Cetaphil Moisturizing Lotion  Clairol Herbal Essence Moisturizing Lotion, Dry Skin  Clairol Herbal Essence Moisturizing Lotion, Extra Dry Skin  Clairol Herbal Essence Moisturizing Lotion, Normal Skin  Curel Age Defying Therapeutic Moisturizing Lotion with Alpha Hydroxy  Curel Extreme Care Body Lotion  Curel Soothing Hands Moisturizing Hand Lotion  Curel Therapeutic Moisturizing Cream, Fragrance-Free  Curel Therapeutic Moisturizing Lotion, Fragrance-Free  Curel Therapeutic Moisturizing Lotion, Original Formula  Eucerin Daily Replenishing  Lotion  Eucerin Dry Skin Therapy Plus Alpha Hydroxy Crme  Eucerin Dry Skin Therapy Plus Alpha Hydroxy Lotion  Eucerin Original Crme  Eucerin Original Lotion  Eucerin Plus Crme Eucerin Plus Lotion  Eucerin TriLipid Replenishing Lotion  Keri Anti-Bacterial Hand Lotion  Keri Deep Conditioning Original Lotion Dry Skin Formula Softly Scented  Keri Deep Conditioning Original Lotion, Fragrance Free Sensitive Skin Formula  Keri Lotion Fast Absorbing Fragrance Free Sensitive Skin Formula  Keri Lotion Fast Absorbing Softly Scented Dry Skin Formula  Keri Original Lotion  Keri Skin Renewal Lotion Keri Silky Smooth Lotion  Keri Silky Smooth Sensitive Skin Lotion  Nivea Body Creamy Conditioning Oil  Nivea Body Extra Enriched Lotion  Nivea Body Original Lotion  Nivea Body Sheer Moisturizing Lotion Nivea Crme  Nivea Skin Firming Lotion  NutraDerm 30 Skin Lotion  NutraDerm Skin Lotion  NutraDerm Therapeutic Skin Cream  NutraDerm Therapeutic Skin Lotion  ProShield Protective Hand Cream  Provon moisturizing lotion  Please read over the following fact sheets that you were given.

## 2024-01-05 ENCOUNTER — Encounter (HOSPITAL_COMMUNITY)
Admission: RE | Admit: 2024-01-05 | Discharge: 2024-01-05 | Disposition: A | Discharge status: Home / Self Care | Source: Ambulatory Visit | Attending: Neurosurgery | Admitting: Neurosurgery

## 2024-01-05 ENCOUNTER — Other Ambulatory Visit: Payer: Self-pay

## 2024-01-05 ENCOUNTER — Encounter (HOSPITAL_COMMUNITY): Payer: Self-pay

## 2024-01-05 VITALS — BP 103/65 | HR 82 | Temp 97.9°F | Resp 16 | Ht 65.0 in | Wt 217.0 lb

## 2024-01-05 DIAGNOSIS — K219 Gastro-esophageal reflux disease without esophagitis: Secondary | ICD-10-CM | POA: Diagnosis not present

## 2024-01-05 DIAGNOSIS — D649 Anemia, unspecified: Secondary | ICD-10-CM | POA: Diagnosis not present

## 2024-01-05 DIAGNOSIS — I10 Essential (primary) hypertension: Secondary | ICD-10-CM | POA: Diagnosis not present

## 2024-01-05 DIAGNOSIS — Z01812 Encounter for preprocedural laboratory examination: Secondary | ICD-10-CM | POA: Insufficient documentation

## 2024-01-05 DIAGNOSIS — E876 Hypokalemia: Secondary | ICD-10-CM | POA: Diagnosis not present

## 2024-01-05 DIAGNOSIS — I350 Nonrheumatic aortic (valve) stenosis: Secondary | ICD-10-CM | POA: Diagnosis not present

## 2024-01-05 DIAGNOSIS — Z01818 Encounter for other preprocedural examination: Secondary | ICD-10-CM

## 2024-01-05 DIAGNOSIS — Z79899 Other long term (current) drug therapy: Secondary | ICD-10-CM | POA: Insufficient documentation

## 2024-01-05 LAB — CBC
HCT: 39.2 % (ref 36.0–46.0)
Hemoglobin: 11.7 g/dL — ABNORMAL LOW (ref 12.0–15.0)
MCH: 26.4 pg (ref 26.0–34.0)
MCHC: 29.8 g/dL — ABNORMAL LOW (ref 30.0–36.0)
MCV: 88.3 fL (ref 80.0–100.0)
Platelets: 217 10*3/uL (ref 150–400)
RBC: 4.44 MIL/uL (ref 3.87–5.11)
RDW: 13 % (ref 11.5–15.5)
WBC: 5.6 10*3/uL (ref 4.0–10.5)
nRBC: 0 % (ref 0.0–0.2)

## 2024-01-05 LAB — TYPE AND SCREEN
ABO/RH(D): O POS
Antibody Screen: NEGATIVE

## 2024-01-05 LAB — BASIC METABOLIC PANEL WITH GFR
Anion gap: 5 (ref 5–15)
BUN: 10 mg/dL (ref 8–23)
CO2: 28 mmol/L (ref 22–32)
Calcium: 9.5 mg/dL (ref 8.9–10.3)
Chloride: 106 mmol/L (ref 98–111)
Creatinine, Ser: 0.68 mg/dL (ref 0.44–1.00)
GFR, Estimated: >60 mL/min (ref >60)
Glucose, Bld: 89 mg/dL (ref 70–99)
Potassium: 3.3 mmol/L — ABNORMAL LOW (ref 3.5–5.1)
Sodium: 139 mmol/L (ref 135–145)

## 2024-01-05 LAB — SURGICAL PCR SCREEN
MRSA, PCR: NEGATIVE
Staphylococcus aureus: NEGATIVE

## 2024-01-05 NOTE — Pre-Procedure Instructions (Signed)
 Surgical Instructions   Your procedure is scheduled on Wednesday, Jan 14, 2024. Report to [facility] Main Entrance "A" at 6:30 A.M., then check in with the Admitting office. Any questions or running late day of surgery: call [phone]  Questions prior to your surgery date: call [phone], Monday-Friday, 8am-4pm. If you experience any cold or flu symptoms such as cough, fever, chills, shortness of breath, etc. between now and your scheduled surgery, please notify us  at the above number.     Remember:  Do not eat or drink after midnight the night before your surgery    Take these medicines the morning of surgery with A SIP OF WATER : Brinzolamide-Brimonide (Simbrinza) eye drops Cyclosprorine (Restasis) eye drops Esomeprazole  (Nexium ) Ezetimibe  (Zetia ) Metoprolol  Succinate (Toprol -XL) Rosuvastatin  (Crestor )  May take these medicines IF NEEDED: Acetaminophen  (Tylenol ) Carboxymethylcellul-glycerin (Optive) eye drops Carisoprodol  (Soma ) Hydrocodone -Acetaminophen  (Norco)  One week prior to surgery, STOP taking any Aspirin  (unless otherwise instructed by your surgeon) Meloxicam  (Mobic ), Aleve, Naproxen, Ibuprofen , Motrin , Advil , Goody's, BC's, all herbal medications, fish oil, and non-prescription vitamins.                     Do NOT Smoke (Tobacco/Vaping) for 24 hours prior to your procedure.  If you use a CPAP at night, you may bring your mask/headgear for your overnight stay.   You will be asked to remove any contacts, glasses, piercing's, hearing aid's, dentures/partials prior to surgery. Please bring cases for these items if needed.    Patients discharged the day of surgery will not be allowed to drive home, and someone needs to stay with them for 24 hours.  SURGICAL WAITING ROOM VISITATION Patients may have no more than 2 support people in the waiting area - these visitors may rotate.   Pre-op nurse will coordinate an appropriate time for 1 ADULT support person, who may  not rotate, to accompany patient in pre-op.  Children under the age of 44 must have an adult with them who is not the patient and must remain in the main waiting area with an adult.  If the patient needs to stay at the hospital during part of their recovery, the visitor guidelines for inpatient rooms apply.  Please refer to the [facility] website for the visitor guidelines for any additional information.   If you received a COVID test during your pre-op visit  it is requested that you wear a mask when out in public, stay away from anyone that may not be feeling well and notify your surgeon if you develop symptoms. If you have been in contact with anyone that has tested positive in the last 10 days please notify you surgeon.      Pre-operative 5 CHG Bathing Instructions   You can play a key role in reducing the risk of infection after surgery. Your skin needs to be as free of germs as possible. You can reduce the number of germs on your skin by washing with CHG (chlorhexidine  gluconate) soap before surgery. CHG is an antiseptic soap that kills germs and continues to kill germs even after washing.   DO NOT use if you have an allergy to chlorhexidine /CHG or antibacterial soaps. If your skin becomes reddened or irritated, stop using the CHG and notify one of our RNs at [phone].   Please shower with the CHG soap starting 4 days before surgery using the following schedule:     Please keep in mind the following:  DO NOT shave, including legs and  underarms, starting the day of your first shower.   You may shave your face at any point before/day of surgery.  Place clean sheets on your bed the day you start using CHG soap. Use a clean washcloth (not used since being washed) for each shower. DO NOT sleep with pets once you start using the CHG.   CHG Shower Instructions:  Wash your face and private area with normal soap. If you choose to wash your hair, wash first with your normal shampoo.   After you use shampoo/soap, rinse your hair and body thoroughly to remove shampoo/soap residue.  Turn the water OFF and apply about 3 tablespoons (45 ml) of CHG soap to a CLEAN washcloth.  Apply CHG soap ONLY FROM YOUR NECK DOWN TO YOUR TOES (washing for 3-5 minutes)  DO NOT use CHG soap on face, private areas, open wounds, or sores.  Pay special attention to the area where your surgery is being performed.  If you are having back surgery, having someone wash your back for you may be helpful. Wait 2 minutes after CHG soap is applied, then you may rinse off the CHG soap.  Pat dry with a clean towel  Put on clean clothes/pajamas   If you choose to wear lotion, please use ONLY the CHG-compatible lotions that are listed below.  Additional instructions for the day of surgery: DO NOT APPLY any lotions, deodorants, cologne, or perfumes.   Do not bring valuables to the hospital. [facility] is not responsible for any belongings/valuables. Do not wear nail polish, gel polish, artificial nails, or any other type of covering on natural nails (fingers and toes) Do not wear jewelry or makeup Put on clean/comfortable clothes.  Please brush your teeth.  Ask your nurse before applying any prescription medications to the skin.     CHG Compatible Lotions   Aveeno Moisturizing lotion  Cetaphil Moisturizing Cream  Cetaphil Moisturizing Lotion  Clairol Herbal Essence Moisturizing Lotion, Dry Skin  Clairol Herbal Essence Moisturizing Lotion, Extra Dry Skin  Clairol Herbal Essence Moisturizing Lotion, Normal Skin  Curel Age Defying Therapeutic Moisturizing Lotion with Alpha Hydroxy  Curel Extreme Care Body Lotion  Curel Soothing Hands Moisturizing Hand Lotion  Curel Therapeutic Moisturizing Cream, Fragrance-Free  Curel Therapeutic Moisturizing Lotion, Fragrance-Free  Curel Therapeutic Moisturizing Lotion, Original Formula  Eucerin Daily Replenishing Lotion  Eucerin Dry Skin Therapy Plus Alpha Hydroxy  Crme  Eucerin Dry Skin Therapy Plus Alpha Hydroxy Lotion  Eucerin Original Crme  Eucerin Original Lotion  Eucerin Plus Crme Eucerin Plus Lotion  Eucerin TriLipid Replenishing Lotion  Keri Anti-Bacterial Hand Lotion  Keri Deep Conditioning Original Lotion Dry Skin Formula Softly Scented  Keri Deep Conditioning Original Lotion, Fragrance Free Sensitive Skin Formula  Keri Lotion Fast Absorbing Fragrance Free Sensitive Skin Formula  Keri Lotion Fast Absorbing Softly Scented Dry Skin Formula  Keri Original Lotion  Keri Skin Renewal Lotion Keri Silky Smooth Lotion  Keri Silky Smooth Sensitive Skin Lotion  Nivea Body Creamy Conditioning Oil  Nivea Body Extra Enriched Lotion  Nivea Body Original Lotion  Nivea Body Sheer Moisturizing Lotion Nivea Crme  Nivea Skin Firming Lotion  NutraDerm 30 Skin Lotion  NutraDerm Skin Lotion  NutraDerm Therapeutic Skin Cream  NutraDerm Therapeutic Skin Lotion  ProShield Protective Hand Cream  Provon moisturizing lotion  Please read over the following fact sheets that you were given.

## 2024-01-05 NOTE — Progress Notes (Signed)
 PCP - Roel Clarity, DO Cardiologist - Mahesh Chandrasekhar,MD  PPM/ICD - denies Device Orders -  Rep Notified -   Chest x-ray - na EKG - 11/28/23 Stress Test - denies ECHO - 11/06/23 Cardiac Cath - denies  Sleep Study - denies CPAP - no  Fasting Blood Sugar - na Checks Blood Sugar _____ times a day  Last dose of GLP1 agonist-  na GLP1 instructions:   Blood Thinner Instructions:na Aspirin  Instructions:na  ERAS Protcol -no PRE-SURGERY Ensure or G2-   COVID TEST- na   Anesthesia review: yes-hx aortic stenosis,HTN  Patient denies shortness of breath, fever, cough and chest pain at PAT appointment   All instructions explained to the patient, with a verbal understanding of the material. Patient agrees to go over the instructions while at home for a better understanding. The opportunity to ask questions was provided.

## 2024-01-06 NOTE — Progress Notes (Signed)
 Anesthesia Chart Review:  63 yo female follows with cardiology for hx of elevated coronary calcium  score, HTN, LVH, moderate aortic stenosis (mean gradient 24 mmHg by echo 11/2023). Last seen by Dr. Paulita Boss 11/28/23 and upcoming surgery was discussed. Per note, "Moderate aortic stenosis confirmed by echocardiogram. Valve morphology not clearly visualized; suspicion of bicuspid valve due to age and calcification. Asymptomatic with no chest pain or dyspnea, maintaining reasonable physical activity. Moderate surgical risk due to aortic stenosis and coronary artery calcifications, but well compensated. Reasonable to proceed to surgery"  Other pertinent hx includes PONV, migraines, GERD on PPI.  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  Preop labs reviewed, mild hypokalemia with potassium 3.3, mild anemia with Hgb 11.7.  EKG 11/28/23: Normal sinus rhythm. 71. Left ventricular hypertrophy  TTE 11/06/23: 1. Left ventricular ejection fraction, by estimation, is 60 to 65%. The  left ventricle has normal function. The left ventricle has no regional  wall motion abnormalities. There is mild concentric left ventricular  hypertrophy. Left ventricular diastolic  parameters are consistent with Grade I diastolic dysfunction (impaired  relaxation).   2. Right ventricular systolic function is normal. The right ventricular  size is normal. There is normal pulmonary artery systolic pressure. The  estimated right ventricular systolic pressure is 30.0 mmHg.   3. The mitral valve is normal in structure. Mild mitral valve  regurgitation. No evidence of mitral stenosis.   4. The aortic valve was not well visualized. There is severe calcifcation  of the aortic valve. Aortic valve regurgitation is trivial. Moderate  aortic valve stenosis. Aortic valve area, by VTI measures 1.24 cm. Aortic  valve mean gradient measures 24.0  mmHg.    5. The inferior vena cava is normal in size with greater than 50%  respiratory variability, suggesting right atrial pressure of 3 mmHg.      Edilia Gordon [facility] Short Stay Center/Anesthesiology Phone [phone] 01/06/2024 4:13 PM

## 2024-01-06 NOTE — Anesthesia Preprocedure Evaluation (Addendum)
 Anesthesia Evaluation  Patient identified by MRN, date of birth, ID band Patient awake    Reviewed: Allergy & Precautions, NPO status , Patient's Chart, lab work & pertinent test results  History of Anesthesia Complications (+) PONV and history of anesthetic complications  Airway Mallampati: II  TM Distance: >3 FB Neck ROM: Full    Dental no notable dental hx.    Pulmonary neg pulmonary ROS   Pulmonary exam normal        Cardiovascular hypertension, Pt. on home beta blockers and Pt. on medications Normal cardiovascular exam+ Valvular Problems/Murmurs AS   ECHO: Aortic Valve: The aortic valve was not well visualized. There is severe  calcifcation of the aortic valve. Aortic valve regurgitation is trivial.  Moderate aortic stenosis is present. Aortic valve mean gradient measures  24.0 mmHg. Aortic valve peak  gradient measures 46.0 mmHg. Aortic valve area, by VTI measures 1.24 cm.     Neuro/Psych  Headaches  negative psych ROS   GI/Hepatic ,GERD  Medicated and Controlled,,(+)     substance abuse    Endo/Other  negative endocrine ROS    Renal/GU negative Renal ROS     Musculoskeletal  (+) Arthritis ,  narcotic dependent  Abdominal  (+) + obese  Peds  Hematology negative hematology ROS (+)   Anesthesia Other Findings Other spondylosis with radiculopathy cervical region  Reproductive/Obstetrics                             Anesthesia Physical Anesthesia Plan  ASA: 4  Anesthesia Plan: General   Post-op Pain Management: Ketamine IV*   Induction: Intravenous  PONV Risk Score and Plan: 4 or greater and Ondansetron , Dexamethasone , Midazolam , Treatment may vary due to age or medical condition and Propofol  infusion  Airway Management Planned: Oral ETT and Video Laryngoscope Planned  Additional Equipment: ClearSight  Intra-op Plan:   Post-operative Plan: Extubation in OR  Informed  Consent: I have reviewed the patients History and Physical, chart, labs and discussed the procedure including the risks, benefits and alternatives for the proposed anesthesia with the patient or authorized representative who has indicated his/her understanding and acceptance.     Dental advisory given  Plan Discussed with: CRNA  Anesthesia Plan Comments: (PAT note by Rudy Costain, PA-C: 63 yo female follows with cardiology for hx of elevated coronary calcium  score, HTN, LVH, moderate aortic stenosis (mean gradient 24 mmHg by echo 11/2023). Last seen by Dr. Paulita Boss 11/28/23 and upcoming surgery was discussed. Per note, "Moderate aortic stenosis confirmed by echocardiogram. Valve morphology not clearly visualized; suspicion of bicuspid valve due to age and calcification. Asymptomatic with no chest pain or dyspnea, maintaining reasonable physical activity. Moderate surgical risk due to aortic stenosis and coronary artery calcifications, but well compensated. Reasonable to proceed to surgery"  Other pertinent hx includes PONV, migraines, GERD on PPI.  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  Preop labs reviewed, mild hypokalemia with potassium 3.3, mild anemia with Hgb 11.7.  EKG 11/28/23: Normal sinus rhythm. 71. Left ventricular hypertrophy  TTE 11/06/23: 1. Left ventricular ejection fraction, by estimation, is 60 to 65%. The  left ventricle has normal function. The left ventricle has no regional  wall motion abnormalities. There is mild concentric left ventricular  hypertrophy. Left ventricular diastolic  parameters are consistent with Grade I diastolic dysfunction (impaired  relaxation).  2. Right ventricular systolic function is  normal. The right ventricular  size is normal. There is normal pulmonary artery systolic pressure. The  estimated right ventricular systolic pressure is 30.0 mmHg.  3. The  mitral valve is normal in structure. Mild mitral valve  regurgitation. No evidence of mitral stenosis.  4. The aortic valve was not well visualized. There is severe calcifcation  of the aortic valve. Aortic valve regurgitation is trivial. Moderate  aortic valve stenosis. Aortic valve area, by VTI measures 1.24 cm. Aortic  valve mean gradient measures 24.0  mmHg.  5. The inferior vena cava is normal in size with greater than 50%  respiratory variability, suggesting right atrial pressure of 3 mmHg.    )        Anesthesia Quick Evaluation

## 2024-01-14 ENCOUNTER — Encounter (HOSPITAL_COMMUNITY): Payer: Self-pay | Admitting: Neurosurgery

## 2024-01-14 ENCOUNTER — Ambulatory Visit (HOSPITAL_COMMUNITY)

## 2024-01-14 ENCOUNTER — Other Ambulatory Visit: Payer: Self-pay

## 2024-01-14 ENCOUNTER — Ambulatory Visit (HOSPITAL_BASED_OUTPATIENT_CLINIC_OR_DEPARTMENT_OTHER): Payer: Self-pay

## 2024-01-14 ENCOUNTER — Encounter (HOSPITAL_COMMUNITY)
Admission: RE | Disposition: A | Discharge status: Home / Self Care | Payer: Self-pay | Source: Home / Self Care | Attending: Neurosurgery

## 2024-01-14 ENCOUNTER — Observation Stay (HOSPITAL_COMMUNITY)
Admission: RE | Admit: 2024-01-14 | Discharge: 2024-01-16 | Disposition: A | Discharge status: Home / Self Care | Attending: Neurosurgery | Admitting: Neurosurgery

## 2024-01-14 ENCOUNTER — Ambulatory Visit (HOSPITAL_COMMUNITY): Payer: Self-pay | Admitting: Physician Assistant

## 2024-01-14 DIAGNOSIS — M50021 Cervical disc disorder at C4-C5 level with myelopathy: Secondary | ICD-10-CM | POA: Diagnosis not present

## 2024-01-14 DIAGNOSIS — E785 Hyperlipidemia, unspecified: Secondary | ICD-10-CM

## 2024-01-14 DIAGNOSIS — M4722 Other spondylosis with radiculopathy, cervical region: Secondary | ICD-10-CM

## 2024-01-14 DIAGNOSIS — I10 Essential (primary) hypertension: Secondary | ICD-10-CM | POA: Diagnosis not present

## 2024-01-14 DIAGNOSIS — M4802 Spinal stenosis, cervical region: Secondary | ICD-10-CM | POA: Insufficient documentation

## 2024-01-14 DIAGNOSIS — M50022 Cervical disc disorder at C5-C6 level with myelopathy: Secondary | ICD-10-CM | POA: Insufficient documentation

## 2024-01-14 DIAGNOSIS — G9520 Unspecified cord compression: Secondary | ICD-10-CM | POA: Diagnosis present

## 2024-01-14 DIAGNOSIS — M25511 Pain in right shoulder: Principal | ICD-10-CM

## 2024-01-14 DIAGNOSIS — R200 Anesthesia of skin: Secondary | ICD-10-CM

## 2024-01-14 HISTORY — PX: ANTERIOR CERVICAL DECOMP/DISCECTOMY FUSION: SHX1161

## 2024-01-14 SURGERY — ANTERIOR CERVICAL DECOMPRESSION/DISCECTOMY FUSION 3 LEVELS
Anesthesia: General | Site: Spine Cervical

## 2024-01-14 MED ORDER — ONDANSETRON HCL 4 MG/2ML IJ SOLN
4.0000 mg | Freq: Four times a day (QID) | INTRAMUSCULAR | Status: DC | PRN
  Administered 2024-01-14 – 2024-01-15 (×2): 4 mg via INTRAVENOUS
  Filled 2024-01-14 (×2): qty 2

## 2024-01-14 MED ORDER — LIDOCAINE 2% (20 MG/ML) 5 ML SYRINGE
INTRAMUSCULAR | Status: DC | PRN
  Administered 2024-01-14: 60 mg via INTRAVENOUS

## 2024-01-14 MED ORDER — VASOPRESSIN 20 UNIT/ML IV SOLN
INTRAVENOUS | Status: AC
  Filled 2024-01-14: qty 1

## 2024-01-14 MED ORDER — FENTANYL CITRATE (PF) 250 MCG/5ML IJ SOLN
INTRAMUSCULAR | Status: AC
  Filled 2024-01-14: qty 5

## 2024-01-14 MED ORDER — ROCURONIUM BROMIDE 10 MG/ML (PF) SYRINGE
PREFILLED_SYRINGE | INTRAVENOUS | Status: AC
  Filled 2024-01-14: qty 10

## 2024-01-14 MED ORDER — MIDAZOLAM HCL 2 MG/2ML IJ SOLN
INTRAMUSCULAR | Status: DC | PRN
  Administered 2024-01-14: 2 mg via INTRAVENOUS

## 2024-01-14 MED ORDER — POTASSIUM CHLORIDE IN NACL 20-0.9 MEQ/L-% IV SOLN
INTRAVENOUS | Status: AC
  Filled 2024-01-14: qty 1000

## 2024-01-14 MED ORDER — ACETAMINOPHEN 650 MG RE SUPP: 650.0000 mg | RECTAL | Status: DC | PRN

## 2024-01-14 MED ORDER — ACETAMINOPHEN 10 MG/ML IV SOLN: 1000.0000 mg | Freq: Once | INTRAVENOUS | Status: DC | PRN

## 2024-01-14 MED ORDER — THROMBIN 20000 UNITS EX SOLR: CUTANEOUS | Status: DC | PRN

## 2024-01-14 MED ORDER — CARBOXYMETHYLCELLUL-GLYCERIN 0.5-0.9 % OP SOLN: 1.0000 [drp] | OPHTHALMIC | Status: DC | PRN

## 2024-01-14 MED ORDER — AMISULPRIDE (ANTIEMETIC) 5 MG/2ML IV SOLN
10.0000 mg | Freq: Once | INTRAVENOUS | Status: AC | PRN
  Administered 2024-01-14: 10 mg via INTRAVENOUS

## 2024-01-14 MED ORDER — DIPHENHYDRAMINE HCL 50 MG/ML IJ SOLN
INTRAMUSCULAR | Status: DC | PRN
Start: 2024-01-14 — End: 2024-01-14
  Administered 2024-01-14: 12.5 mg via INTRAVENOUS

## 2024-01-14 MED ORDER — NETARSUDIL-LATANOPROST 0.02-0.005 % OP SOLN: 2.0000 [drp] | Freq: Every day | OPHTHALMIC | Status: DC

## 2024-01-14 MED ORDER — HYDROMORPHONE HCL 1 MG/ML IJ SOLN
INTRAMUSCULAR | Status: AC
  Filled 2024-01-14: qty 1

## 2024-01-14 MED ORDER — DEXAMETHASONE SODIUM PHOSPHATE 10 MG/ML IJ SOLN
10.0000 mg | Freq: Once | INTRAMUSCULAR | Status: AC
  Administered 2024-01-14: 10 mg via INTRAVENOUS

## 2024-01-14 MED ORDER — AMISULPRIDE (ANTIEMETIC) 5 MG/2ML IV SOLN
INTRAVENOUS | Status: AC
  Filled 2024-01-14: qty 4

## 2024-01-14 MED ORDER — CHLORHEXIDINE GLUCONATE 0.12 % MT SOLN: 15.0000 mL | Freq: Once | OROMUCOSAL | Status: AC

## 2024-01-14 MED ORDER — LABETALOL HCL 5 MG/ML IV SOLN
INTRAVENOUS | Status: DC | PRN
Start: 2024-01-14 — End: 2024-01-14
  Administered 2024-01-14 (×2): 5 mg via INTRAVENOUS

## 2024-01-14 MED ORDER — KETAMINE HCL 50 MG/5ML IJ SOSY
PREFILLED_SYRINGE | INTRAMUSCULAR | Status: DC | PRN
  Administered 2024-01-14: 10 mg via INTRAVENOUS
  Administered 2024-01-14: 15 mg via INTRAVENOUS
  Administered 2024-01-14: 10 mg via INTRAVENOUS

## 2024-01-14 MED ORDER — LIDOCAINE-EPINEPHRINE 0.5 %-1:200000 IJ SOLN
INTRAMUSCULAR | Status: DC | PRN
  Administered 2024-01-14: 10 mL

## 2024-01-14 MED ORDER — DEXAMETHASONE SODIUM PHOSPHATE 10 MG/ML IJ SOLN
INTRAMUSCULAR | Status: AC
Start: 2024-01-14 — End: ?
  Filled 2024-01-14: qty 1

## 2024-01-14 MED ORDER — LIDOCAINE 2% (20 MG/ML) 5 ML SYRINGE
INTRAMUSCULAR | Status: AC
  Filled 2024-01-14: qty 5

## 2024-01-14 MED ORDER — ONDANSETRON HCL 4 MG PO TABS
4.0000 mg | ORAL_TABLET | Freq: Four times a day (QID) | ORAL | Status: DC | PRN
  Filled 2024-01-14: qty 1

## 2024-01-14 MED ORDER — CHLORHEXIDINE GLUCONATE CLOTH 2 % EX PADS: 6.0000 | MEDICATED_PAD | Freq: Once | CUTANEOUS | Status: DC

## 2024-01-14 MED ORDER — FENTANYL CITRATE (PF) 100 MCG/2ML IJ SOLN
25.0000 ug | INTRAMUSCULAR | Status: DC | PRN
  Administered 2024-01-14 (×3): 50 ug via INTRAVENOUS

## 2024-01-14 MED ORDER — PROPOFOL 10 MG/ML IV BOLUS
INTRAVENOUS | Status: DC | PRN
  Administered 2024-01-14: 200 mg via INTRAVENOUS

## 2024-01-14 MED ORDER — ORAL CARE MOUTH RINSE: 15.0000 mL | Freq: Once | OROMUCOSAL | Status: AC

## 2024-01-14 MED ORDER — BRIMONIDINE TARTRATE 0.2 % OP SOLN
1.0000 [drp] | Freq: Three times a day (TID) | OPHTHALMIC | Status: DC
  Administered 2024-01-14 – 2024-01-16 (×5): 1 [drp] via OPHTHALMIC
  Filled 2024-01-14: qty 5

## 2024-01-14 MED ORDER — LOSARTAN POTASSIUM-HCTZ 100-25 MG PO TABS: 1.0000 | ORAL_TABLET | Freq: Every day | ORAL | Status: DC

## 2024-01-14 MED ORDER — DEXAMETHASONE SODIUM PHOSPHATE 10 MG/ML IJ SOLN
INTRAMUSCULAR | Status: AC
  Filled 2024-01-14: qty 1

## 2024-01-14 MED ORDER — METOPROLOL SUCCINATE ER 50 MG PO TB24
50.0000 mg | ORAL_TABLET | Freq: Every day | ORAL | Status: DC
  Administered 2024-01-15: 50 mg via ORAL
  Filled 2024-01-14: qty 1

## 2024-01-14 MED ORDER — LOSARTAN POTASSIUM 50 MG PO TABS
100.0000 mg | ORAL_TABLET | Freq: Every day | ORAL | Status: DC
  Administered 2024-01-14 – 2024-01-15 (×2): 100 mg via ORAL
  Filled 2024-01-14 (×2): qty 2

## 2024-01-14 MED ORDER — SODIUM CHLORIDE 0.9% FLUSH
3.0000 mL | Freq: Two times a day (BID) | INTRAVENOUS | Status: DC
  Administered 2024-01-15 – 2024-01-16 (×3): 3 mL via INTRAVENOUS

## 2024-01-14 MED ORDER — KETAMINE HCL 50 MG/5ML IJ SOSY
PREFILLED_SYRINGE | INTRAMUSCULAR | Status: AC
  Filled 2024-01-14: qty 5

## 2024-01-14 MED ORDER — FENTANYL CITRATE (PF) 250 MCG/5ML IJ SOLN
INTRAMUSCULAR | Status: DC | PRN
  Administered 2024-01-14: 25 ug via INTRAVENOUS
  Administered 2024-01-14 (×2): 50 ug via INTRAVENOUS
  Administered 2024-01-14: 150 ug via INTRAVENOUS
  Administered 2024-01-14: 50 ug via INTRAVENOUS

## 2024-01-14 MED ORDER — DIPHENHYDRAMINE HCL 50 MG/ML IJ SOLN
INTRAMUSCULAR | Status: AC
  Filled 2024-01-14: qty 1

## 2024-01-14 MED ORDER — EZETIMIBE 10 MG PO TABS
10.0000 mg | ORAL_TABLET | Freq: Every day | ORAL | Status: DC
  Administered 2024-01-15 – 2024-01-16 (×2): 10 mg via ORAL
  Filled 2024-01-14 (×3): qty 1

## 2024-01-14 MED ORDER — ONDANSETRON HCL 4 MG/2ML IJ SOLN
INTRAMUSCULAR | Status: AC
  Filled 2024-01-14: qty 2

## 2024-01-14 MED ORDER — MAGNESIUM OXIDE -MG SUPPLEMENT 400 (240 MG) MG PO TABS
400.0000 mg | ORAL_TABLET | Freq: Every day | ORAL | Status: DC
  Filled 2024-01-14 (×2): qty 1

## 2024-01-14 MED ORDER — AMITRIPTYLINE HCL 10 MG PO TABS
10.0000 mg | ORAL_TABLET | Freq: Every day | ORAL | Status: DC
  Administered 2024-01-14 – 2024-01-15 (×2): 10 mg via ORAL
  Filled 2024-01-14 (×3): qty 1

## 2024-01-14 MED ORDER — SODIUM CHLORIDE 0.9 % IV SOLN: 250.0000 mL | INTRAVENOUS | Status: AC

## 2024-01-14 MED ORDER — MENTHOL 3 MG MT LOZG: 1.0000 | LOZENGE | OROMUCOSAL | Status: DC | PRN

## 2024-01-14 MED ORDER — ACETAMINOPHEN 325 MG PO TABS: 650.0000 mg | ORAL_TABLET | ORAL | Status: DC | PRN

## 2024-01-14 MED ORDER — LACTATED RINGERS IV SOLN: INTRAVENOUS | Status: DC | PRN

## 2024-01-14 MED ORDER — THROMBIN 20000 UNITS EX SOLR
CUTANEOUS | Status: AC
  Filled 2024-01-14: qty 20000

## 2024-01-14 MED ORDER — DEXAMETHASONE SODIUM PHOSPHATE 10 MG/ML IJ SOLN
INTRAMUSCULAR | Status: DC | PRN
  Administered 2024-01-14: 10 mg via INTRAVENOUS

## 2024-01-14 MED ORDER — PROPOFOL 500 MG/50ML IV EMUL
INTRAVENOUS | Status: DC | PRN
  Administered 2024-01-14: 125 ug/kg/min via INTRAVENOUS
  Administered 2024-01-14: 100 ug/kg/min via INTRAVENOUS

## 2024-01-14 MED ORDER — BRINZOLAMIDE 1 % OP SUSP
1.0000 [drp] | Freq: Three times a day (TID) | OPHTHALMIC | Status: DC
  Administered 2024-01-14 – 2024-01-16 (×5): 1 [drp] via OPHTHALMIC
  Filled 2024-01-14: qty 10

## 2024-01-14 MED ORDER — PHENYLEPHRINE HCL-NACL 20-0.9 MG/250ML-% IV SOLN
INTRAVENOUS | Status: DC | PRN
  Administered 2024-01-14: 30 ug/min via INTRAVENOUS

## 2024-01-14 MED ORDER — ADULT MULTIVITAMIN W/MINERALS CH
1.0000 | ORAL_TABLET | Freq: Every day | ORAL | Status: DC
  Filled 2024-01-14: qty 1

## 2024-01-14 MED ORDER — LIDOCAINE-EPINEPHRINE 0.5 %-1:200000 IJ SOLN
INTRAMUSCULAR | Status: AC
  Filled 2024-01-14: qty 50

## 2024-01-14 MED ORDER — CEFAZOLIN SODIUM-DEXTROSE 2-4 GM/100ML-% IV SOLN
INTRAVENOUS | Status: AC
  Filled 2024-01-14: qty 100

## 2024-01-14 MED ORDER — ROSUVASTATIN CALCIUM 20 MG PO TABS
40.0000 mg | ORAL_TABLET | Freq: Every day | ORAL | Status: DC
  Administered 2024-01-15 – 2024-01-16 (×2): 40 mg via ORAL
  Filled 2024-01-14 (×3): qty 2

## 2024-01-14 MED ORDER — SODIUM CHLORIDE 0.9% FLUSH: 3.0000 mL | INTRAVENOUS | Status: DC | PRN

## 2024-01-14 MED ORDER — MIDAZOLAM HCL 2 MG/2ML IJ SOLN
INTRAMUSCULAR | Status: AC
  Filled 2024-01-14: qty 2

## 2024-01-14 MED ORDER — ROCURONIUM BROMIDE 10 MG/ML (PF) SYRINGE
PREFILLED_SYRINGE | INTRAVENOUS | Status: DC | PRN
  Administered 2024-01-14 (×2): 20 mg via INTRAVENOUS
  Administered 2024-01-14: 60 mg via INTRAVENOUS
  Administered 2024-01-14: 10 mg via INTRAVENOUS
  Administered 2024-01-14: 20 mg via INTRAVENOUS
  Administered 2024-01-14: 10 mg via INTRAVENOUS

## 2024-01-14 MED ORDER — FENTANYL CITRATE (PF) 100 MCG/2ML IJ SOLN
INTRAMUSCULAR | Status: AC
  Filled 2024-01-14: qty 2

## 2024-01-14 MED ORDER — CEFAZOLIN SODIUM-DEXTROSE 2-4 GM/100ML-% IV SOLN
2.0000 g | INTRAVENOUS | Status: AC
  Administered 2024-01-14: 2 g via INTRAVENOUS

## 2024-01-14 MED ORDER — PHENYLEPHRINE 80 MCG/ML (10ML) SYRINGE FOR IV PUSH (FOR BLOOD PRESSURE SUPPORT)
PREFILLED_SYRINGE | INTRAVENOUS | Status: DC | PRN
  Administered 2024-01-14 (×2): 160 ug via INTRAVENOUS

## 2024-01-14 MED ORDER — 0.9 % SODIUM CHLORIDE (POUR BTL) OPTIME
TOPICAL | Status: DC | PRN
  Administered 2024-01-14: 1000 mL

## 2024-01-14 MED ORDER — DEXAMETHASONE SODIUM PHOSPHATE 10 MG/ML IJ SOLN
10.0000 mg | Freq: Once | INTRAMUSCULAR | Status: AC
  Administered 2024-01-14: 10 mg via INTRAVENOUS
  Filled 2024-01-14: qty 1

## 2024-01-14 MED ORDER — HYDROCHLOROTHIAZIDE 25 MG PO TABS
25.0000 mg | ORAL_TABLET | Freq: Every day | ORAL | Status: DC
  Administered 2024-01-14 – 2024-01-15 (×2): 25 mg via ORAL
  Filled 2024-01-14 (×2): qty 1

## 2024-01-14 MED ORDER — TRAMADOL HCL 50 MG PO TABS
50.0000 mg | ORAL_TABLET | Freq: Four times a day (QID) | ORAL | Status: DC | PRN
Start: 2024-01-14 — End: 2024-01-16
  Administered 2024-01-14 – 2024-01-15 (×3): 100 mg via ORAL
  Filled 2024-01-14 (×4): qty 2

## 2024-01-14 MED ORDER — CYCLOSPORINE 0.05 % OP EMUL: 1.0000 [drp] | Freq: Every day | OPHTHALMIC | Status: DC | PRN

## 2024-01-14 MED ORDER — PROPOFOL 10 MG/ML IV BOLUS
INTRAVENOUS | Status: AC
  Filled 2024-01-14: qty 20

## 2024-01-14 MED ORDER — PHENOL 1.4 % MT LIQD: 1.0000 | OROMUCOSAL | Status: DC | PRN

## 2024-01-14 MED ORDER — SUGAMMADEX SODIUM 200 MG/2ML IV SOLN
INTRAVENOUS | Status: DC | PRN
  Administered 2024-01-14: 200 mg via INTRAVENOUS

## 2024-01-14 MED ORDER — LATANOPROST 0.005 % OP SOLN
1.0000 [drp] | Freq: Every day | OPHTHALMIC | Status: DC
  Administered 2024-01-14 – 2024-01-15 (×2): 1 [drp] via OPHTHALMIC
  Filled 2024-01-14: qty 2.5

## 2024-01-14 MED ORDER — HYDROMORPHONE HCL 1 MG/ML IJ SOLN
0.2500 mg | INTRAMUSCULAR | Status: DC | PRN
  Administered 2024-01-14: 0.5 mg via INTRAVENOUS

## 2024-01-14 MED ORDER — CHLORHEXIDINE GLUCONATE 0.12 % MT SOLN
OROMUCOSAL | Status: AC
  Administered 2024-01-14: 15 mL via OROMUCOSAL
  Filled 2024-01-14: qty 15

## 2024-01-14 MED ORDER — PANTOPRAZOLE SODIUM 40 MG PO TBEC
40.0000 mg | DELAYED_RELEASE_TABLET | Freq: Every day | ORAL | Status: DC
  Administered 2024-01-14 – 2024-01-16 (×3): 40 mg via ORAL
  Filled 2024-01-14 (×4): qty 1

## 2024-01-14 MED ORDER — DIAZEPAM 5 MG PO TABS
5.0000 mg | ORAL_TABLET | Freq: Four times a day (QID) | ORAL | Status: DC | PRN
  Administered 2024-01-14 – 2024-01-15 (×3): 5 mg via ORAL
  Filled 2024-01-14 (×3): qty 1

## 2024-01-14 SURGICAL SUPPLY — 45 items
BAG COUNTER SPONGE SURGICOUNT (BAG) ×2 IMPLANT
BAND RUBBER #18 3X1/16 STRL (MISCELLANEOUS) ×4 IMPLANT
BONE SPACER C-PLUG 14X6X12 (Bone Implant) IMPLANT
BONE SPACER C-PLUG 14X7X12 (Bone Implant) IMPLANT
BUR DRUM 4.0 (BURR) ×2 IMPLANT
BUR MATCHSTICK NEURO 3.0 LAGG (BURR) ×2 IMPLANT
CANISTER SUCTION 3000ML PPV (SUCTIONS) ×2 IMPLANT
DERMABOND ADVANCED .7 DNX12 (GAUZE/BANDAGES/DRESSINGS) ×2 IMPLANT
DRAPE HALF SHEET 40X57 (DRAPES) IMPLANT
DRAPE LAPAROTOMY 100X72 PEDS (DRAPES) ×2 IMPLANT
DRAPE MICROSCOPE SLANT 54X150 (MISCELLANEOUS) ×2 IMPLANT
DURAPREP 6ML APPLICATOR 50/CS (WOUND CARE) ×2 IMPLANT
ELECT COATED BLADE 2.86 ST (ELECTRODE) ×2 IMPLANT
ELECTRODE REM PT RTRN 9FT ADLT (ELECTROSURGICAL) ×2 IMPLANT
GAUZE 4X4 16PLY ~~LOC~~+RFID DBL (SPONGE) IMPLANT
GLOVE ECLIPSE 6.5 STRL STRAW (GLOVE) ×2 IMPLANT
GLOVE EXAM NITRILE XL STR (GLOVE) IMPLANT
GOWN STRL REUS W/ TWL LRG LVL3 (GOWN DISPOSABLE) ×4 IMPLANT
GOWN STRL REUS W/ TWL XL LVL3 (GOWN DISPOSABLE) IMPLANT
GOWN STRL REUS W/TWL 2XL LVL3 (GOWN DISPOSABLE) IMPLANT
HALTER HD/CHIN CERV TRACTION D (MISCELLANEOUS) IMPLANT
HEMOSTAT SURGICEL 2X14 (HEMOSTASIS) IMPLANT
KIT BASIN OR (CUSTOM PROCEDURE TRAY) ×2 IMPLANT
KIT TURNOVER KIT B (KITS) ×2 IMPLANT
NDL HYPO 25X1 1.5 SAFETY (NEEDLE) ×2 IMPLANT
NDL SPNL 22GX3.5 QUINCKE BK (NEEDLE) ×2 IMPLANT
NEEDLE HYPO 25X1 1.5 SAFETY (NEEDLE) ×1 IMPLANT
NEEDLE SPNL 22GX3.5 QUINCKE BK (NEEDLE) ×1 IMPLANT
NS IRRIG 1000ML POUR BTL (IV SOLUTION) ×2 IMPLANT
PACK LAMINECTOMY NEURO (CUSTOM PROCEDURE TRAY) ×2 IMPLANT
PAD ARMBOARD POSITIONER FOAM (MISCELLANEOUS) ×2 IMPLANT
PATTIES SURGICAL .5 X.5 (GAUZE/BANDAGES/DRESSINGS) ×2 IMPLANT
PATTIES SURGICAL 1X1 (DISPOSABLE) ×2 IMPLANT
PLATE HELIX TRANSLATIONAL 38M (Plate) IMPLANT
SCREW HELIX T SELF TAP 15MM (Screw) IMPLANT
SCREW SELF DRILL VARIABLE 13MM (Screw) IMPLANT
SPIKE FLUID TRANSFER (MISCELLANEOUS) ×2 IMPLANT
SPONGE INTESTINAL PEANUT (DISPOSABLE) ×2 IMPLANT
SPONGE SURGIFOAM ABS GEL 100 (HEMOSTASIS) IMPLANT
SUT VIC AB 0 CT1 27XBRD ANTBC (SUTURE) IMPLANT
SUT VIC AB 3-0 SH 8-18 (SUTURE) ×2 IMPLANT
SYR 30ML SLIP (SYRINGE) ×2 IMPLANT
TOWEL GREEN STERILE (TOWEL DISPOSABLE) ×2 IMPLANT
TOWEL GREEN STERILE FF (TOWEL DISPOSABLE) ×2 IMPLANT
WATER STERILE IRR 1000ML POUR (IV SOLUTION) ×2 IMPLANT

## 2024-01-14 NOTE — Anesthesia Procedure Notes (Signed)
 Procedure Name: Intubation Date/Time: 01/14/2024 9:37 AM  Performed by: Basilio Limb, CRNAPre-anesthesia Checklist: Patient identified, Emergency Drugs available, Suction available and Patient being monitored Patient Re-evaluated:Patient Re-evaluated prior to induction Oxygen Delivery Method: Circle System Utilized Preoxygenation: Pre-oxygenation with 100% oxygen Induction Type: IV induction Ventilation: Mask ventilation without difficulty Laryngoscope Size: Glidescope and 4 Grade View: Grade I Tube type: Oral Tube size: 7.0 mm Number of attempts: 1 Airway Equipment and Method: Stylet and Oral airway Placement Confirmation: ETT inserted through vocal cords under direct vision, positive ETCO2 and breath sounds checked- equal and bilateral Secured at: 21 cm Tube secured with: Tape Dental Injury: Teeth and Oropharynx as per pre-operative assessment

## 2024-01-14 NOTE — Progress Notes (Signed)
 Patient ID: Yvonne Sanders, female   DOB: 04/13/1961, 63 y.o.   MRN: [id number] BP (!) 150/92 (BP Location: Right Arm)   Pulse 90   Temp 97.8 F (36.6 C) (Oral)   Resp 18   Ht 5\' 5"  (1.651 m)   Wt 98.4 kg   LMP 09/03/2007 (Approximate)   SpO2 100%   BMI 36.10 kg/m  MRI reviewed. No compression noted. Some signal change in cord. No indications for exploration. Expect improvement in left sided strength

## 2024-01-14 NOTE — Anesthesia Postprocedure Evaluation (Signed)
 Anesthesia Post Note  Patient: Yvonne Sanders  Procedure(s) Performed: CERVICAL FOUR-FIVE, CERVICAL FIVE-SIX ANTERIOR CERVICAL DECOMPRESSION/DISCECTOMY FUSION (Spine Cervical)     Patient location during evaluation: PACU Anesthesia Type: General Level of consciousness: awake Pain management: pain level controlled Vital Signs Assessment: post-procedure vital signs reviewed and stable Respiratory status: spontaneous breathing, nonlabored ventilation and respiratory function stable Cardiovascular status: blood pressure returned to baseline and stable Postop Assessment: no apparent nausea or vomiting Anesthetic complications: no   No notable events documented.  Last Vitals:  Vitals:   01/14/24 1729 01/14/24 1920  BP: (!) 169/97 (!) 150/92  Pulse: 93 90  Resp: 18 18  Temp:  36.6 C  SpO2: 100% 100%    Last Pain:  Vitals:   01/14/24 1920  TempSrc: Oral  PainSc:                  [name]

## 2024-01-14 NOTE — Transfer of Care (Signed)
 Immediate Anesthesia Transfer of Care Note  Patient: Yvonne Sanders  Procedure(s) Performed: CERVICAL FOUR-FIVE, CERVICAL FIVE-SIX ANTERIOR CERVICAL DECOMPRESSION/DISCECTOMY FUSION (Spine Cervical)  Patient Location: PACU  Anesthesia Type:General  Level of Consciousness: awake, alert , and oriented  Airway & Oxygen Therapy: Patient Spontanous Breathing and Patient connected to face mask oxygen  Post-op Assessment: Report given to RN and Post -op Vital signs reviewed and stable  Post vital signs: Reviewed and stable  Last Vitals:  Vitals Value Taken Time  BP 137/90 01/14/24 1330  Temp    Pulse 82 01/14/24 1338  Resp 10 01/14/24 1338  SpO2 93 % 01/14/24 1338  Vitals shown include unfiled device data.  Last Pain:  Vitals:   01/14/24 0720  TempSrc:   PainSc: 4          Complications: No notable events documented.

## 2024-01-14 NOTE — H&P (Signed)
 BP 115/82   Pulse 89   Temp 97.9 F (36.6 C) (Oral)   Resp 18   Ht 5\' 5"  (1.651 m)   Wt 98.4 kg   LMP 09/03/2007 (Approximate)   SpO2 97%   BMI 36.10 kg/m  Yvonne Sanders comes in today secondary to a traumatic incident which occurred in October of 2024.  At that time a heavy cabinet fell, tearing of rotator cuff in the right shoulder, and striking her head.  She had most likely a loss of consciousness, and she cannot remember who or what removed the cabinet from on top of her.  She has had increasingly poor balance ever since that accident.  In 2022, she mentioned that she had some minor issues with her balance, but nothing that was bad, and then when she returned for followup at that time she had no problems with her balance, her neck felt fine, and she was doing well.  An MRI had been done.  It did show the stenosis and this does go back to 2022.  But again at that point, I had no indications that anything whatsoever should be done.  She had no pain.  She was not myelopathic.  Again, while balance she mentioned in June of that year 2022, by October she said everything had resolved.  She had no problems again until she was struck by the cabinet in October.  She maintains significant cervical stenosis at 3-4, cervical 4-5, cervical 5-6.  The films are not significantly changed, but her neurological exam is.   She is alert.  She is oriented x4.  She answers all questions appropriately.  Memory, language, attention span, and fund of knowledge are normal.  She is walking with a cane, which she had never done prior to her visit today.  She is unable to tandem walk.  She has swaying with her Romberg test.  She is not hyperreflexic at the biceps, triceps, brachioradialis, knees, or ankles.   MRI shows cervical spine.  It shows the stenosis at 3-4, 4-5, 5-6, some milder stenosis at 6-7.  There is no obvious cord signal on the FLAIR image or on a T2.   [name] clearly has new neurological symptoms since  the cabinet fell on top of her.  This is one of those rare cases but beneficial to her in as much as we saw the MRI prior to this and also noted that she had a very good exam at that time, and now she has a very similar scan, but she has a very poor neurological exam with new findings.  The analogy I always give is that she has a room full of dynamite, but as long as no one throws a match, the dynamite is not at risk.  But, the match was the cabinet and the room full of dynamite was her cervical spine and the match did cause something to explode.  I, without any equivocation, believe that the cabinet falling on the side of her neck and head is what led to her current problems with her balance now due to the cervical stenosis, which was present but asymptomatic.  It is now symptomatic.  She weighs 219 pounds.  Temperature is 98, blood pressure 128/85, pulse 81.  Pain is 3/10. I believe [name] needs an anterior cervical decompression and arthrodesis at C3-4, C4-5, C5/6 with anterior plating and bone implants.  They obviously can be done with any kind of implant.  I prefer bone, but she does  need the spinal canal decompressed.  Since she has a mild kyphotic deformity, I do not believe this is best approached posteriorly but an anterior approach would be the best.

## 2024-01-14 NOTE — Op Note (Signed)
 01/14/2024  1:15 PM  PATIENT:  Yvonne Sanders  63 y.o. 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 was taken to the operating room, intubated, and placed under general anesthesia without difficulty. She was positioned supine with her head in slight extension on a horseshoe headrest. The neck was prepped and draped in a sterile manner. I infiltrated 5 cc's 1/2%lidocaine /1:200,000 strength epinephrine  into the planned incision starting from the midline to the medial border of the left sternocleidomastoid muscle. I opened the incision with a 10 blade and dissected sharply through soft tissue to the platysma. I dissected in the plane superior to the platysma both rostrally and caudally. I then opened the platysma in a horizontal fashion with Metzenbaum scissors, and dissected in the inferior plane rostrally and caudally. With both blunt and sharp technique I created an avascular corridor to the cervical spine. I placed a spinal needle(s) in the disc space at C4/5 . I then reflected the longus colli from C4 to C6 and placed self retaining retractors. I opened the disc space(s) at 4/5,5/6 with a 15 blade. We removed disc with curettes, Kerrison punches, and the drill. Using the drill I removed osteophytes and prepared for the decompression.  I decompressed the spinal canal and the C5,6 root(s) with the  drill, Kerrison punches, and the curettes. I used the microscope to aid in microdissection. I removed the posterior longitudinal ligament to fully expose and decompress the thecal sac. I exposed the roots laterally taking down the 4/5,5/6 uncovertebral joints. With the decompression complete I moved on to the arthrodesis. I used the drill to level the surfaces of C4,5,and 6. I removed soft tissue to prepare the disc space and the bony surfaces. I measured the space and placed a 7mm structural allograft into the disc space at 5/6, 6mm at C4/5.  I then placed the anterior instrumentation. I placed 2 screws in each vertebral body through the plate. I locked the screws into place. Intraoperative xray showed the graft, plate, and screws to be in good position. I irrigated the wound, achieved hemostasis, and closed the wound in layers. I approximated the platysma, and the subcuticular plane with vicryl sutures. I used Dermabond for a sterile dressing.   PLAN OF CARE: Admit for overnight observation  PATIENT DISPOSITION:  PACU - hemodynamically stable.   Delay start of Pharmacological VTE agent (>24hrs) due to surgical blood loss or risk of bleeding:  yes

## 2024-01-15 ENCOUNTER — Encounter (HOSPITAL_COMMUNITY): Payer: Self-pay | Admitting: Neurosurgery

## 2024-01-15 DIAGNOSIS — M4722 Other spondylosis with radiculopathy, cervical region: Secondary | ICD-10-CM | POA: Diagnosis not present

## 2024-01-15 MED ORDER — TIZANIDINE HCL 4 MG PO TABS
4.0000 mg | ORAL_TABLET | Freq: Three times a day (TID) | ORAL | Status: DC | PRN
  Administered 2024-01-16: 4 mg via ORAL
  Filled 2024-01-15: qty 1

## 2024-01-15 MED ORDER — ALUM & MAG HYDROXIDE-SIMETH 200-200-20 MG/5ML PO SUSP
30.0000 mL | Freq: Four times a day (QID) | ORAL | Status: DC | PRN
  Administered 2024-01-15: 30 mL via ORAL
  Filled 2024-01-15: qty 30

## 2024-01-15 MED ORDER — DIPHENHYDRAMINE HCL 25 MG PO CAPS
25.0000 mg | ORAL_CAPSULE | Freq: Four times a day (QID) | ORAL | Status: DC | PRN
  Administered 2024-01-15: 25 mg via ORAL
  Filled 2024-01-15: qty 1

## 2024-01-15 MED ORDER — DIPHENHYDRAMINE HCL 25 MG PO CAPS
25.0000 mg | ORAL_CAPSULE | Freq: Four times a day (QID) | ORAL | Status: DC | PRN
  Administered 2024-01-15 – 2024-01-16 (×2): 50 mg via ORAL
  Filled 2024-01-15 (×3): qty 2

## 2024-01-15 MED ORDER — HYDROMORPHONE HCL 2 MG PO TABS
2.0000 mg | ORAL_TABLET | ORAL | Status: DC | PRN
Start: 2024-01-15 — End: 2024-01-16
  Administered 2024-01-15 – 2024-01-16 (×2): 2 mg via ORAL
  Filled 2024-01-15 (×2): qty 1

## 2024-01-15 NOTE — Evaluation (Signed)
 Occupational Therapy Evaluation Patient Details Name: Yvonne Sanders MRN: [id number] DOB: Nov 18, 1960 Today's Date: 01/15/2024   History of Present Illness   Pt is a 63 yo female s/p anterior cervical decompression and fusion C4/5,5/6. PHMx: migraines     Clinical Impressions This 63 yo female admitted and underwent above presents to acute OT with PLOF of managing her ADLs and IADLs but were more difficult than when before the cabinet fell on her. She complained of a headache at a 4 before we got started and went up to a "12" by the end of the session with me (RN made aware). She reported feeling foggy/dizzy when she sat and stood up. Pt also reports her left UE and left LE are weak and were not this way prior to surgery. We will continue to follow. HHOT is recommended for left UE and ADLs/IADLs.  BP sitting                       109/72 HR 85 BP standing 1 minute    107/76  HR 93 BP standing 3 minutes  113/78  HR 90    If plan is discharge home, recommend the following:   A little help with walking and/or transfers;A little help with bathing/dressing/bathroom;Assistance with cooking/housework;Assist for transportation;Help with stairs or ramp for entrance     Functional Status Assessment   Patient has had a recent decline in their functional status and demonstrates the ability to make significant improvements in function in a reasonable and predictable amount of time.       Precautions/Restrictions   Precautions Precautions: Cervical;Fall Precaution Booklet Issued: Yes (comment) Recall of Precautions/Restrictions: Intact Required Braces or Orthoses: Cervical Brace Cervical Brace: Soft collar (pt has one--says it makes her feel better) Restrictions Weight Bearing Restrictions Per Provider Order: No     Mobility Bed Mobility Overal bed mobility: Modified Independent             General bed mobility comments: increased time , HOB up    Transfers Overall  transfer level: Needs assistance Equipment used: Rolling walker (2 wheels) Transfers: Sit to/from Stand Sit to Stand: Contact guard assist                  Balance Overall balance assessment: Mild deficits observed, not formally tested                                         ADL either performed or assessed with clinical judgement   ADL                                         General ADL Comments: Educated on use of straws when drinking from cups, use of 2 cups for mouth care (one to spit and one with straw to rinse), bed mobility, upper and lower body dressing, avoiding extreme movements with neck     Vision Patient Visual Report: No change from baseline              Pertinent Vitals/Pain Pain Assessment Pain Assessment: 0-10 Pain Score: 10-Worst pain ever (went from a 4 while supine to a 12 by the time she layed back down (20 minutes)) Pain Location: headache Pain Descriptors / Indicators: Headache Pain Intervention(s): Limited activity within patient's  tolerance, Monitored during session, Repositioned     Extremity/Trunk Assessment Upper Extremity Assessment Upper Extremity Assessment: Right hand dominant;LUE deficits/detail LUE Deficits / Details: shoulder flexion 2-/5, elbow flexion/ext 3/5, forearm pronation 3/5, forearm supination 2+/5, wrist flexion/extension 2/5, digit composite flexion/extension 3/5, finger adduction/abduction 2/5, finger to thumb opposition can do digits 2-4 with 5 really having to concentrate and take more time LUE Coordination: decreased fine motor;decreased gross motor           Communication Communication Communication: No apparent difficulties   Cognition Arousal: Alert Behavior During Therapy: WFL for tasks assessed/performed Cognition: No apparent impairments                               Following commands: Intact       Cueing   Cueing Techniques: Verbal cues               Home Living Family/patient expects to be discharged to:: Private residence Living Arrangements: Spouse/significant other;Other relatives Available Help at Discharge: Family;Available 24 hours/day Type of Home: House Home Access: Stairs to enter [employer] of Steps: 1 Entrance Stairs-Rails: None Home Layout: One level     Bathroom Shower/Tub: Walk-in shower;Door   Bathroom Toilet: Handicapped height     Home Equipment: Agricultural consultant (2 wheels)              OT Problem List: Decreased strength;Decreased range of motion;Impaired balance (sitting and/or standing);Decreased activity tolerance;Pain;Impaired UE functional use   OT Treatment/Interventions: Self-care/ADL training;DME and/or AE instruction;Balance training;Patient/family education;Therapeutic exercise      OT Goals(Current goals can be found in the care plan section)   Acute Rehab OT Goals Patient Stated Goal: to feel better and get left arm and leg working back to normal OT Goal Formulation: With patient Time For Goal Achievement: 01/29/24 Potential to Achieve Goals: Good   OT Frequency:  Min 2X/week       AM-PAC OT "6 Clicks" Daily Activity     Outcome Measure Help from another person eating meals?: A Little (setup) Help from another person taking care of personal grooming?: A Little Help from another person toileting, which includes using toliet, bedpan, or urinal?: A Little Help from another person bathing (including washing, rinsing, drying)?: A Little Help from another person to put on and taking off regular upper body clothing?: A Little Help from another person to put on and taking off regular lower body clothing?: A Little 6 Click Score: 18   End of Session Equipment Utilized During Treatment: Gait belt;Rolling walker (2 wheels) Nurse Communication:  (HHOT)  Activity Tolerance:  (limited by increase in headache the more she was up and a foggy filling when standing) Patient  left: in bed  OT Visit Diagnosis: Unsteadiness on feet (R26.81);Other abnormalities of gait and mobility (R26.89);Muscle weakness (generalized) (M62.81);Pain Pain - part of body:  (headache)                Time: 1610-9604 OT Time Calculation (min): 34 min Charges:  OT General Charges $OT Visit: 1 Visit OT Evaluation $OT Eval Moderate Complexity: 1 Mod OT Treatments $Self Care/Home Management : 8-22 mins  [name] OT Acute Rehabilitation Services Office [phone]    [name] 01/15/2024, 11:39 AM

## 2024-01-15 NOTE — Evaluation (Signed)
 Physical Therapy Evaluation  Patient Details Name: Yvonne Sanders MRN: [id number] DOB: Aug 25, 1961 Today's Date: 01/15/2024  History of Present Illness  Pt is a 63 yo female s/p anterior cervical decompression and fusion C4/5,5/6. PHMx: migraines  Clinical Impression  Pt admitted with above diagnosis. At the time of PT eval, pt was able to demonstrate transfers and ambulation with gross min assist to CGA, +2 for safety with RW for support. Pt was educated on precautions, brace application/wearing schedule, appropriate activity progression, and car transfer. Pt currently with functional limitations due to the deficits listed below (see PT Problem List). Pt will benefit from skilled PT to increase their independence and safety with mobility to allow discharge to the venue listed below.          If plan is discharge home, recommend the following: A little help with walking and/or transfers;A little help with bathing/dressing/bathroom;Assistance with cooking/housework;Assist for transportation;Help with stairs or ramp for entrance   Can travel by private vehicle        Equipment Recommendations Rolling walker (2 wheels)  Recommendations for Other Services       Functional Status Assessment Patient has had a recent decline in their functional status and demonstrates the ability to make significant improvements in function in a reasonable and predictable amount of time.     Precautions / Restrictions Precautions Precautions: Cervical;Fall Precaution Booklet Issued: Yes (comment) Recall of Precautions/Restrictions: Intact Required Braces or Orthoses: Cervical Brace Cervical Brace: Soft collar (pt has one--says it makes her feel better) Restrictions Weight Bearing Restrictions Per Provider Order: No      Mobility  Bed Mobility Overal bed mobility: Modified Independent             General bed mobility comments: increased time , HOB elevated.    Transfers Overall transfer  level: Needs assistance Equipment used: Rolling walker (2 wheels) Transfers: Sit to/from Stand Sit to Stand: Contact guard assist           General transfer comment: VC's for hand placement on seated surface for safety.    Ambulation/Gait Ambulation/Gait assistance: +2 safety/equipment, Min assist Gait Distance (Feet): 100 Feet Assistive device: Rolling walker (2 wheels) Gait Pattern/deviations: Step-through pattern, Decreased stride length, Trunk flexed Gait velocity: Decreased Gait velocity interpretation: <1.31 ft/sec, indicative of household ambulator   General Gait Details: Slow and guarded. Pt reports increased headache pain with OOB mobility, up to 8/10 pain. Pt with slightly decreased heel strike/ floor clearance on the L but overall ambulating fair with the RW. No overt LOB noted, however pt reports "dizziness". When asked to clarify, pt states she does not have vertigo, but more of a lightheaded feeling. Pt appears unsteady.  Stairs            Wheelchair Mobility     Tilt Bed    Modified Rankin (Stroke Patients Only)       Balance Overall balance assessment: Mild deficits observed, not formally tested                                           Pertinent Vitals/Pain Pain Assessment Pain Assessment: 0-10 Pain Score: 8  Pain Location: headache Pain Descriptors / Indicators: Headache    Home Living Family/patient expects to be discharged to:: Private residence Living Arrangements: Spouse/significant other;Other relatives Available Help at Discharge: Family;Available 24 hours/day Type of Home: House Home Access: Stairs to  enter Entrance Stairs-Rails: None Entrance Stairs-Number of Steps: 1   Home Layout: One level Home Equipment: Agricultural consultant (2 wheels)      Prior Function                       Extremity/Trunk Assessment   Upper Extremity Assessment Upper Extremity Assessment: Defer to OT evaluation LUE Deficits /  Details: shoulder flexion 2-/5, elbow flexion/ext 3/5, forearm pronation 3/5, forearm supination 2+/5, wrist flexion/extension 2/5, digit composite flexion/extension 3/5, finger adduction/abduction 2/5, finger to thumb opposition can do digits 2-4 with 5 really having to concentrate and take more time LUE Coordination: decreased fine motor;decreased gross motor    Lower Extremity Assessment Lower Extremity Assessment: LLE deficits/detail LLE Deficits / Details: Grossly 4-/5 with functional activity. Pt reports improved today compared to yesterday post-op LLE Coordination: decreased gross motor    Cervical / Trunk Assessment Cervical / Trunk Assessment: Neck Surgery  Communication   Communication Communication: No apparent difficulties    Cognition Arousal: Alert Behavior During Therapy: WFL for tasks assessed/performed   PT - Cognitive impairments: No apparent impairments                         Following commands: Intact       Cueing Cueing Techniques: Verbal cues     General Comments      Exercises     Assessment/Plan    PT Assessment Patient needs continued PT services  PT Problem List Decreased strength;Decreased activity tolerance;Decreased balance;Decreased mobility;Decreased knowledge of use of DME;Decreased safety awareness;Decreased knowledge of precautions;Pain       PT Treatment Interventions DME instruction;Gait training;Stair training;Functional mobility training;Therapeutic activities;Therapeutic exercise;Balance training;Patient/family education    PT Goals (Current goals can be found in the Care Plan section)  Acute Rehab PT Goals Patient Stated Goal: Home at d/c, get function back to L side PT Goal Formulation: With patient/family Time For Goal Achievement: 01/22/24 Potential to Achieve Goals: Good    Frequency Min 5X/week     Co-evaluation               AM-PAC PT "6 Clicks" Mobility  Outcome Measure Help needed turning from  your back to your side while in a flat bed without using bedrails?: A Little Help needed moving from lying on your back to sitting on the side of a flat bed without using bedrails?: A Little Help needed moving to and from a bed to a chair (including a wheelchair)?: A Little Help needed standing up from a chair using your arms (e.g., wheelchair or bedside chair)?: A Little Help needed to walk in hospital room?: A Little Help needed climbing 3-5 steps with a railing? : A Lot 6 Click Score: 17    End of Session Equipment Utilized During Treatment: Gait belt;Cervical collar Activity Tolerance: Patient tolerated treatment well Patient left: in bed;with call bell/phone within reach;with family/visitor present;with SCD's reapplied Nurse Communication: Mobility status PT Visit Diagnosis: Unsteadiness on feet (R26.81);Pain Pain - part of body:  (neck)    Time: 9147-8295 PT Time Calculation (min) (ACUTE ONLY): 32 min   Charges:   PT Evaluation $PT Eval Low Complexity: 1 Low PT Treatments $Gait Training: 8-22 mins PT General Charges $$ ACUTE PT VISIT: 1 Visit         Simone Dubois, PT, DPT Acute Rehabilitation Services Secure Chat Preferred Office: [phone]   Yvonne Sanders 01/15/2024, 2:47 PM

## 2024-01-15 NOTE — Progress Notes (Signed)
 Patient ID: Yvonne Sanders, female   DOB: 03-25-1961, 63 y.o.   MRN: [id number] BP 119/69 (BP Location: Left Arm)   Pulse 96   Temp 98.5 F (36.9 C) (Oral)   Resp 17   Ht 5\' 5"  (1.651 m)   Wt 98.4 kg   LMP 09/03/2007 (Approximate)   SpO2 98%   BMI 36.10 kg/m  Alert and oriented x 4 Strength much improved on the left side.  Walking on her own, will keep overnight for more PT. Wound is clean, dry, no signs of infection. Voice is strong, speech is clear.

## 2024-01-15 NOTE — Progress Notes (Signed)
 Occupational Therapy Treatment Patient Details Name: Yvonne Sanders MRN: [id number] DOB: [birth year] Today's Date: 01/15/2024   History of present illness Pt is a 63 yo female s/p anterior cervical decompression and fusion C4/5,5/6. PHMx: migraines   OT comments  This 63 yo female seen a second time to go over LUE FM and GM activities with handouts for her to work on her weakness in LUE. Pt verbalized understanding of them. We will continue to follow.      If plan is discharge home, recommend the following:  A little help with walking and/or transfers;A little help with bathing/dressing/bathroom;Assistance with cooking/housework;Assist for transportation;Help with stairs or ramp for entrance   Equipment Recommendations  None recommended by OT       Precautions / Restrictions Precautions Precautions: Cervical;Fall Precaution Booklet Issued: Yes (comment) Recall of Precautions/Restrictions: Intact Required Braces or Orthoses: Cervical Brace Cervical Brace: Soft collar (pt has one--says it makes her feel better) Restrictions Weight Bearing Restrictions Per Provider Order: No       Mobility Bed Mobility Overal bed mobility: Modified Independent             General bed mobility comments: increased time , HOB up    Transfers Overall transfer level: Needs assistance Equipment used: Rolling walker (2 wheels) Transfers: Sit to/from Stand Sit to Stand: Contact guard assist                 Balance Overall balance assessment: Mild deficits observed, not formally tested                                         ADL either performed or assessed with clinical judgement   ADL                                         General ADL Comments: Educated on use of straws when drinking from cups, use of 2 cups for mouth care (one to spit and one with straw to rinse), bed mobility, upper and lower body dressing, avoiding extreme movements with  neck    Extremity/Trunk Assessment Upper Extremity Assessment Upper Extremity Assessment: Right hand dominant;LUE deficits/detail LUE Deficits / Details: shoulder flexion 2-/5, elbow flexion/ext 3/5, forearm pronation 3/5, forearm supination 2+/5, wrist flexion/extension 2/5, digit composite flexion/extension 3/5, finger adduction/abduction 2/5, finger to thumb opposition can do digits 2-4 with 5 really having to concentrate and take more time LUE Coordination: decreased fine motor;decreased gross motor            Vision Patient Visual Report: No change from baseline           Communication Communication Communication: No apparent difficulties   Cognition Arousal: Alert Behavior During Therapy: WFL for tasks assessed/performed Cognition: No apparent impairments                               Following commands: Intact        Cueing   Cueing Techniques: Verbal cues  Exercises Other Exercises Other Exercises: Pt given tan and yellow theraputty with handout for theraputty exercises, FM handout, elbow-forearm-wrist-hand handout, lap slide handout for lap and on table top, and self range of motion handout for shoulder adduction and forward flexion. Instructions as to reps and times  per day on each sheet.            Pertinent Vitals/ Pain       Pain Assessment Pain Assessment: 0-10 Pain Score: 4  Pain Location: headache Pain Descriptors / Indicators: Headache Pain Intervention(s): Limited activity within patient's tolerance, Monitored during session  Home Living Family/patient expects to be discharged to:: Private residence Living Arrangements: Spouse/significant other;Other relatives Available Help at Discharge: Family;Available 24 hours/day Type of Home: House Home Access: Stairs to enter [employer] of Steps: 1 Entrance Stairs-Rails: None Home Layout: One level     Bathroom Shower/Tub: Walk-in shower;Door   Bathroom Toilet: Handicapped  height     Home Equipment: Agricultural consultant (2 wheels)              Frequency  Min 2X/week        Progress Toward Goals  OT Goals(current goals can now be found in the care plan section)  Progress towards OT goals: Progressing toward goals  Acute Rehab OT Goals Patient Stated Goal: to feel better and for left arm and leg to get back to working normal OT Goal Formulation: With patient Time For Goal Achievement: 01/29/24 Potential to Achieve Goals: Good         AM-PAC OT "6 Clicks" Daily Activity     Outcome Measure   Help from another person eating meals?: A Little (setup) Help from another person taking care of personal grooming?: A Little Help from another person toileting, which includes using toliet, bedpan, or urinal?: A Little Help from another person bathing (including washing, rinsing, drying)?: A Little Help from another person to put on and taking off regular upper body clothing?: A Little Help from another person to put on and taking off regular lower body clothing?: A Little 6 Click Score: 18    End of Session Equipment Utilized During Treatment: Gait belt;Rolling walker (2 wheels)  OT Visit Diagnosis: Unsteadiness on feet (R26.81);Other abnormalities of gait and mobility (R26.89);Muscle weakness (generalized) (M62.81);Pain Pain - part of body:  (headache)   Activity Tolerance Patient tolerated treatment well   Patient Left in bed   Nurse Communication  (HHOT)        Time: 1008-1030 OT Time Calculation (min): 22 min  Charges: OT General Charges $OT Visit: 1 Visit OT Evaluation $OT Eval Moderate Complexity: 1 Mod OT Treatments $Self Care/Home Management : 8-22 mins $Therapeutic Exercise: 8-22 mins  Yvonne Sanders OT Acute Rehabilitation Services Office [phone]    [name] 01/15/2024, 12:08 PM

## 2024-01-16 DIAGNOSIS — M4722 Other spondylosis with radiculopathy, cervical region: Secondary | ICD-10-CM | POA: Diagnosis not present

## 2024-01-16 MED ORDER — HYDROMORPHONE HCL 2 MG PO TABS: 2.0000 mg | ORAL_TABLET | ORAL | 0 refills | Status: AC | PRN

## 2024-01-16 MED ORDER — TIZANIDINE HCL 4 MG PO TABS: 4.0000 mg | ORAL_TABLET | Freq: Four times a day (QID) | ORAL | 0 refills | Status: AC | PRN

## 2024-01-16 NOTE — Care Management (Addendum)
 Transition of Care [facility]) - Inpatient Brief Assessment   Patient Details  Name: Yvonne Sanders MRN: [id number] Date of Birth: 07-Oct-1960  Transition of Care [facility]) CM/SW Contact:    Ronni Colace, RN Phone Number: 01/16/2024, 9:22 AM   Clinical Narrative: Met with patient and family at bedside. No known HH affiliation with [facility]. Patient asking for out of network with [facility].  If HH cannot be found then agreeable to OP PT but would much prefer HH.  Called [facility] to see if there are any affiliated home health agencies in the area. Aaron Aas Spoke to [employer],  He listed two aagencies that are in network [facility] and [facility]- [phone] Lita Rieger from [facility] called [facility] left message to call back and needs. If they cannot take then will set up OP therapy. [facility] cannot take at this time. Still awaiting call from [facility].  1110 No call from [facility], set up with [facility] neuro for OP on AVS Transition of Care Asessment: Insurance and Status: Insurance coverage has been reviewed Patient has primary care physician: Yes Home environment has been reviewed: [facility] Prior level of function:: Independent Prior/Current Home Services: No current home services Social Drivers of Health Review: SDOH reviewed no interventions necessary Readmission risk has been reviewed: Yes Transition of care needs: transition of care needs identified, TOC will continue to follow

## 2024-01-16 NOTE — Discharge Instructions (Addendum)
 Wound Care Leave incision open to air. You may shower. Do not scrub directly on incision.  Do not put any creams, lotions, or ointments on incision. Activity Walk each and every day, increasing distance each day. No lifting greater than 5 lbs.  Avoid excessive neck motion. No driving for 2 weeks; may ride as a passenger locally. Wear neck brace at all times except when showering.  If provided soft collar, may wear for comfort unless otherwise instructed. Diet Resume your normal diet.  Return to Work Will be discussed at you follow up appointment. Call Your Doctor If Any of These Occur Redness, drainage, or swelling at the wound.  Temperature greater than 101 degrees. Severe pain not relieved by pain medication. Increased difficulty swallowing. Incision starts to come apart. Follow Up Appt Call today for appointment in 3 weeks ([phone]) or for problems.  If you have any hardware placed in your spine, you will need an x-ray before your appointment.          Anterior Cervical Fusion Care After Pinching of the nerves is a common cause of long-term pain. When this happens, a procedure called an anterior cervical fusion is sometimes performed. It relieves the pressure on the pinched nerve roots or spinal cord in the neck. An anterior cervical fusion means that the operation is done through the front (anterior) of your neck to fuse bones in your neck together. This procedure is done to relieve the pressure on pinched nerve roots or spinal cord. This operation is done to control the movement of your spine, which may be pressing on the nerves. This may relieve the pain. The procedure that stops the movement of the spine is called a fusion. The cut by the surgeon (incision) is usually within a skin fold line under your chin. After moving the neck muscles gently apart, the neurosurgeon uses an operating microscope and removes the injured intervertebral disk (the cushion or pad of tissue  between the bones of the spine). This takes the pressure off the nerves or spinal cord. This is called decompression. The area where the disc was removed is then filled with a bone graft. The graft will fuse the vertebrae together over time. This means it causes the vertebral bodies to grow together. The bone graft may be obtained from your own bone (your hip for example), or may be obtained from a bone bank. Receiving bone from a bone bank is similar to a blood bank, only the bone comes from human donors who have recently died. This type of graft is referred to as allograft bone. The preformed bone plug is safe and will not be rejected by your body. It does not contain blood cells. In some cases, the surgeon may use hardware in your neck to help stabilize it. This means that metal plates or pins or screws may be used to: Provide extra support to the neck.  Help the bones to grow together more easily.  A cervical fusion procedure takes a couple hours to several hours, depending on what needs to be done. Your caregiver will be able to answer your questions for you. HOME CARE INSTRUCTIONS  It will be normal to have a sore throat and have difficulty swallowing foods for a couple weeks following surgery. See your caregiver if this seems to be getting worse rather than better.  You may resume normal diet and activities as directed or allowed. Generally, walking and stair climbing are fine. Avoid lifting more than ten pounds and do  no lifting above your head.  If given a cervical collar, remove only for bathing and eating, or as directed.  Use only showers for cleaning up, with no bathing, until seen.  You may apply ice to the surgical or bone donor site for 15 to 20 minutes each hour while awake for the first couple days following surgery. Put the ice in a plastic bag and place a towel between the bag of ice and your skin.  Change dressings if necessary or as directed.  You may drive in 10 days  Take  prescribed medication as directed. Only take over-the-counter or prescription medicines for pain, discomfort, or fever as directed by your caregiver.  Make an appointment to see your caregiver for suture or staple removal when instructed.  If physical therapy was prescribed, follow your caregiver's directions.  SEEK IMMEDIATE MEDICAL CARE IF: There is redness, swelling, or increasing pain in the wound.  There is pus coming from the wound.  An unexplained oral temperature over 102 F (38.9 C) develops.  There is a bad smell coming from the wound or dressing.  You have swelling in your calf or leg.  You develop shortness of breath or chest pain.  The wound edges break open after sutures or staples have been removed.  Your pain is not controlled with medicine.  You seem to be getting worse rather than better.  Document Released: 04/02/2004 Document Revised: 05/01/2011 Document Reviewed: 06/08/2008 [facility] Patient Information 2012 [place], [place].

## 2024-01-16 NOTE — Progress Notes (Signed)
 Occupational Therapy Treatment and discharge Patient Details Name: Yvonne Sanders MRN: [id number] DOB: 08/08/1961 Today's Date: 01/16/2024   History of present illness Pt is a 63 yo female s/p anterior cervical decompression and fusion C4/5,5/6. PHMx: migraines   OT comments  This 63 yo female admitted and underwent above presents to acute OT with more education on ADLs and LUE exercises/activities given today. Pt to D/C home today so acute OT will sign off.      If plan is discharge home, recommend the following:  A little help with walking and/or transfers;A little help with bathing/dressing/bathroom;Assistance with cooking/housework;Assist for transportation;Help with stairs or ramp for entrance   Equipment Recommendations  None recommended by OT       Precautions / Restrictions Precautions Precautions: Cervical;Fall Precaution Booklet Issued: Yes (comment) Recall of Precautions/Restrictions: Intact Required Braces or Orthoses: Cervical Brace Cervical Brace: Soft collar (pt has one--says it makes her feel better) Restrictions Weight Bearing Restrictions Per Provider Order: No       Mobility Bed Mobility Overal bed mobility: Modified Independent                  Transfers Overall transfer level: Needs assistance Equipment used: Rolling walker (2 wheels) Transfers: Sit to/from Stand Sit to Stand: Supervision                 Balance Overall balance assessment: Mild deficits observed, not formally tested                                         ADL either performed or assessed with clinical judgement   ADL Overall ADL's : Needs assistance/impaired Eating/Feeding: Set up;Sitting   Grooming: Set up;Sitting;Standing   Upper Body Bathing: Set up;Sitting   Lower Body Bathing: Moderate assistance Lower Body Bathing Details (indicate cue type and reason): S sit<>stand; needs A for lower legs and feet due to cannot cross legs to get to  feet Upper Body Dressing : Set up;Sitting   Lower Body Dressing: Moderate assistance Lower Body Dressing Details (indicate cue type and reason): S sit<>stand; needs A for lower legs and feet due to cannot cross legs to get to feet Toilet Transfer: Supervision/safety;Ambulation;Comfort height toilet;Grab bars;Rolling walker (2 wheels)             General ADL Comments: Pt reports they have order a tub/shower chair    Extremity/Trunk Assessment Upper Extremity Assessment LUE Deficits / Details: shoulder flexion 3/5, elbow flexion/ext 3/5, forearm pronation 3/5, forearm supination 3/5, wrist flexion/extension 3/5, digit composite flexion/extension 3/5, finger adduction/abduction 3/5, finger to thumb opposition can do all digits today easily LUE Coordination: decreased fine motor;decreased gross motor            Vision Patient Visual Report: No change from baseline           Communication Communication Communication: No apparent difficulties   Cognition Arousal: Alert Behavior During Therapy: WFL for tasks assessed/performed Cognition: No apparent impairments                               Following commands: Intact        Cueing   Cueing Techniques: Verbal cues  Exercises Other Exercises Other Exercises: Exercised sheets revised to meet patient's needs since she has more AROM in LUE today  Pertinent Vitals/ Pain       Pain Assessment Pain Assessment: Faces Faces Pain Scale: Hurts little more Pain Location: a little headache, pain across the tops of her shoulders Pain Descriptors / Indicators: Headache, Aching Pain Intervention(s): Limited activity within patient's tolerance, Monitored during session, Repositioned         Frequency  Min 2X/week        Progress Toward Goals  OT Goals(current goals can now be found in the care plan section)  Progress towards OT goals: Progressing toward goals  Acute Rehab OT Goals Patient Stated  Goal: to go home today and for left side and balance to continue to get better OT Goal Formulation: With patient Time For Goal Achievement: 01/29/24 Potential to Achieve Goals: Good         AM-PAC OT "6 Clicks" Daily Activity     Outcome Measure   Help from another person eating meals?: A Little Help from another person taking care of personal grooming?: A Little Help from another person toileting, which includes using toliet, bedpan, or urinal?: A Little Help from another person bathing (including washing, rinsing, drying)?: A Lot Help from another person to put on and taking off regular upper body clothing?: A Little Help from another person to put on and taking off regular lower body clothing?: A Lot 6 Click Score: 16    End of Session Equipment Utilized During Treatment: Rolling walker (2 wheels)  OT Visit Diagnosis: Unsteadiness on feet (R26.81);Other abnormalities of gait and mobility (R26.89);Muscle weakness (generalized) (M62.81);Pain Pain - part of body:  (headache and across shoulders)   Activity Tolerance Patient tolerated treatment well      Nurse Communication  (pt ready to go from OT stand point)        Time: 5284-1324 OT Time Calculation (min): 51 min  Charges: OT Treatments $Self Care/Home Management : 38-52 mins Yvonne Sanders OT Acute Rehabilitation Services Office [phone]    Yvonne Sanders 01/16/2024, 11:04 AM

## 2024-01-16 NOTE — Progress Notes (Signed)
 Physical Therapy Treatment  Patient Details Name: Yvonne Sanders MRN: [id number] DOB: Jul 05, 1961 Today's Date: 01/16/2024   History of Present Illness Pt is a 63 yo female s/p anterior cervical decompression and fusion C4/5,5/6. PHMx: migraines    PT Comments  Pt progressing well with post-op mobility. She was able to demonstrate transfers and ambulation with gross min assist to supervision for safety and RW for support. Family present and supportive throughout session. Reinforced education on precautions, brace application/wearing schedule, appropriate activity progression, and car transfer. CM present at start of session to report no insurance coverage for HHPT. Updated recommendations to reflect this. Will continue to follow.      If plan is discharge home, recommend the following: A little help with walking and/or transfers;A little help with bathing/dressing/bathroom;Assistance with cooking/housework;Assist for transportation;Help with stairs or ramp for entrance   Can travel by private vehicle        Equipment Recommendations  Rolling walker (2 wheels)    Recommendations for Other Services       Precautions / Restrictions Precautions Precautions: Cervical;Fall Precaution Booklet Issued: Yes (comment) Recall of Precautions/Restrictions: Intact Required Braces or Orthoses: Cervical Brace Cervical Brace: Soft collar Restrictions Weight Bearing Restrictions Per Provider Order: No     Mobility  Bed Mobility               General bed mobility comments: Pt was received exiting the bathroom upon arrival.    Transfers Overall transfer level: Needs assistance Equipment used: Rolling walker (2 wheels) Transfers: Sit to/from Stand Sit to Stand: Supervision           General transfer comment: VC's for hand placement on seated surface for safety.    Ambulation/Gait Ambulation/Gait assistance: Min assist Gait Distance (Feet): 300 Feet Assistive device: Rolling  walker (2 wheels) Gait Pattern/deviations: Step-through pattern, Decreased stride length, Trunk flexed Gait velocity: Decreased Gait velocity interpretation: <1.31 ft/sec, indicative of household ambulator   General Gait Details: Pt continues to appear guarded with movement and overall with decreased gait speed. Pt reports feeling better today overall. 1 instance of lightheadedness in which pt let go of RW to reach for external support.   Stairs             Wheelchair Mobility     Tilt Bed    Modified Rankin (Stroke Patients Only)       Balance Overall balance assessment: Mild deficits observed, not formally tested                                          Communication Communication Communication: No apparent difficulties  Cognition Arousal: Alert Behavior During Therapy: WFL for tasks assessed/performed   PT - Cognitive impairments: No apparent impairments                         Following commands: Intact      Cueing Cueing Techniques: Verbal cues  Exercises      General Comments        Pertinent Vitals/Pain Pain Assessment Pain Assessment: Faces Faces Pain Scale: Hurts a little bit Pain Location: a little headache, pain across the tops of her shoulders Pain Descriptors / Indicators: Headache, Aching Pain Intervention(s): Limited activity within patient's tolerance, Monitored during session, Repositioned    Home Living  Prior Function            PT Goals (current goals can now be found in the care plan section) Acute Rehab PT Goals Patient Stated Goal: Home at d/c, get function back to L side PT Goal Formulation: With patient/family Time For Goal Achievement: 01/22/24 Potential to Achieve Goals: Good Progress towards PT goals: Progressing toward goals    Frequency    Min 5X/week      PT Plan      Co-evaluation              AM-PAC PT "6 Clicks" Mobility   Outcome  Measure  Help needed turning from your back to your side while in a flat bed without using bedrails?: None Help needed moving from lying on your back to sitting on the side of a flat bed without using bedrails?: None Help needed moving to and from a bed to a chair (including a wheelchair)?: A Little Help needed standing up from a chair using your arms (e.g., wheelchair or bedside chair)?: A Little Help needed to walk in hospital room?: A Little Help needed climbing 3-5 steps with a railing? : A Little 6 Click Score: 20    End of Session Equipment Utilized During Treatment: Gait belt;Cervical collar Activity Tolerance: Patient tolerated treatment well Patient left: in bed;with call bell/phone within reach;with family/visitor present;with SCD's reapplied Nurse Communication: Mobility status PT Visit Diagnosis: Unsteadiness on feet (R26.81);Pain Pain - part of body:  (neck)     Time: 1610-9604 PT Time Calculation (min) (ACUTE ONLY): 37 min  Charges:    $Gait Training: 23-37 mins PT General Charges $$ ACUTE PT VISIT: 1 Visit                     Simone Dubois, PT, DPT Acute Rehabilitation Services Secure Chat Preferred Office: [phone]    Venus Ginsberg 01/16/2024, 1:56 PM

## 2024-01-16 NOTE — Discharge Summary (Signed)
 Physician Discharge Summary  Patient ID: Yvonne Sanders MRN: [id number] DOB/AGE: [birth year] 63 y.o.  Admit date: 01/14/2024 Discharge date: 01/16/2024  Admission Diagnoses:cervical cord compression with myelopathy C5/6,4/5  Discharge Diagnoses: same Principal Problem:   Cervical cord compression with myelopathy [facility])   Discharged Condition: good  Hospital Course: Yvonne Sanders was taken tot he operating room for an ACDF at C4/5, 5/6. Upon awakening she reported weakness in the left upper and lower extremity. Xray in pacu showed the hardware to be in correct position. MRI that night showed cord signal but no compression. She made rapid improvements on pod 1, and the day of discharge. Wound is clean, dry, no signs of infection.   Treatments: surgery: as above, nuvasive hardware ACDF C4/5,5/6  Discharge Exam: Blood pressure (!) 98/58, pulse 64, temperature 98.7 F (37.1 C), temperature source Oral, resp. rate 20, height 5\' 5"  (1.651 m), weight 98.4 kg, last menstrual period 09/03/2007, SpO2 100%. General appearance: alert, appears stated age, and mild distress  Disposition: Discharge disposition: 01-Home or Self Care      Other spondylosis with radiculopathy cervical region Discharge Instructions     Ambulatory referral to Physical Therapy   Complete by: As directed    PT evaluate and treat Cervical decompression post op      Allergies as of 01/16/2024       Reactions   Nubain [nalbuphine Hcl] Anaphylaxis   Nubain [nalbuphine Hcl] Nausea And Vomiting   Corticosteroids Other (See Comments)   Due to glaucoma can not have steroids   Dilaudid  [hydromorphone  Hcl] Nausea And Vomiting   Severe itching   Oxycodone -acetaminophen  Itching   Percodan [oxycodone -aspirin ] Hives, Itching   Tomato Rash   Rash, spots on tongue        Medication List     TAKE these medications    acetaminophen  500 MG tablet Commonly known as: TYLENOL  Take 1 tablet (500 mg total) by mouth  every 6 (six) hours as needed.   amitriptyline 10 MG tablet Commonly known as: ELAVIL Take 10 mg by mouth at bedtime.   b complex vitamins capsule Take 1 capsule by mouth 2 (two) times a week.   Biotin 5000 MCG Caps Take 5,000 mcg by mouth 2 (two) times a week.   carisoprodol  350 MG tablet Commonly known as: SOMA  1 po qd pr What changed:  how much to take how to take this when to take this reasons to take this additional instructions   cycloSPORINE 0.05 % ophthalmic emulsion Commonly known as: RESTASIS Place 1 drop into both eyes daily as needed (dry eye).   esomeprazole  40 MG capsule Commonly known as: NEXIUM  Take 1 capsule (40 mg total) by mouth daily.   ezetimibe  10 MG tablet Commonly known as: ZETIA  Take 1 tablet (10 mg total) by mouth daily.   HYDROcodone -acetaminophen  5-325 MG tablet Commonly known as: NORCO/VICODIN Take 1 tablet by mouth every 6 (six) hours as needed for moderate pain (pain score 4-6). What changed:  how much to take when to take this   HYDROmorphone  2 MG tablet Commonly known as: DILAUDID  Take 1 tablet (2 mg total) by mouth every 3 (three) hours as needed for severe pain (pain score 7-10).   losartan -hydrochlorothiazide 100-25 MG tablet Commonly known as: Hyzaar Take 1 tablet by mouth daily.   magnesium  oxide 400 (240 Mg) MG tablet Commonly known as: MAG-OX Take 400 mg by mouth daily.   metoprolol  succinate 50 MG 24 hr tablet Commonly known as: TOPROL -XL TAKE 1 TABLET  BY MOUTH ONCE DAILY WITH  OR  IMMEDIATELY  FOLLOWING  A  MEAL   multivitamins ther. w/minerals Tabs tablet Take 2 tablets by mouth daily.   OPTIVE 0.5-0.9 % ophthalmic solution Generic drug: carboxymethylcellul-glycerin Place 1-2 drops into both eyes as needed for dry eyes.   Repatha  SureClick 140 MG/ML Soaj Generic drug: Evolocumab  INJECT 1 ML INTO THE SKIN EVERY 14 DAYS   Rocklatan 0.02-0.005 % Soln Generic drug: Netarsudil-Latanoprost Place 2 drops into  both eyes at bedtime.   rosuvastatin  40 MG tablet Commonly known as: CRESTOR  Take 1 tablet (40 mg total) by mouth daily.   Simbrinza 1-0.2 % Susp Generic drug: Brinzolamide-Brimonidine Place 1 drop into both eyes 3 (three) times daily.   tiZANidine 4 MG tablet Commonly known as: ZANAFLEX Take 1 tablet (4 mg total) by mouth every 6 (six) hours as needed for muscle spasms.        Follow-up Information     [facility] Follow up.   Specialty: Rehabilitation Why: THey will call you to set up services for PT Contact information: [address] [place]  [address] [phone]        Audie Bleacher, MD Follow up.   Specialty: Neurosurgery Why: keep your scheduled appointment Contact information: [address] [place] [address] [phone]                 Signed: Audie Bleacher 01/16/2024, 1:18 PM

## 2024-01-16 NOTE — Progress Notes (Signed)
 Patient awaiting family for discharge home, Patient in no acute distress nor complaints of pain nor discomfort; incision on neck is clean, dry and intact; No c/o pain at this time. Room was checked and accounted for all patient's belongings; discharge instructions concerning his medications, incision care, follow up appointment and when to call the doctor as needed were all discussed with patient by RN and She and her sister expressed understanding on the instructions given.

## 2024-01-24 ENCOUNTER — Other Ambulatory Visit: Payer: Self-pay | Admitting: Internal Medicine

## 2024-03-16 ENCOUNTER — Other Ambulatory Visit: Payer: Self-pay | Admitting: Family Medicine

## 2024-05-13 ENCOUNTER — Other Ambulatory Visit: Payer: Self-pay | Admitting: Internal Medicine

## 2024-05-13 DIAGNOSIS — E7849 Other hyperlipidemia: Secondary | ICD-10-CM

## 2024-05-17 ENCOUNTER — Encounter: Payer: Self-pay | Admitting: Family Medicine

## 2024-05-17 ENCOUNTER — Ambulatory Visit: Payer: PRIVATE HEALTH INSURANCE | Admitting: Family Medicine

## 2024-05-17 VITALS — BP 96/60 | HR 67 | Temp 98.0°F | Resp 18 | Ht 65.0 in | Wt 222.0 lb

## 2024-05-17 DIAGNOSIS — Z0001 Encounter for general adult medical examination with abnormal findings: Secondary | ICD-10-CM | POA: Diagnosis not present

## 2024-05-17 DIAGNOSIS — E785 Hyperlipidemia, unspecified: Secondary | ICD-10-CM | POA: Diagnosis not present

## 2024-05-17 DIAGNOSIS — Z23 Encounter for immunization: Secondary | ICD-10-CM

## 2024-05-17 DIAGNOSIS — M48061 Spinal stenosis, lumbar region without neurogenic claudication: Secondary | ICD-10-CM

## 2024-05-17 DIAGNOSIS — I10 Essential (primary) hypertension: Secondary | ICD-10-CM

## 2024-05-17 DIAGNOSIS — K219 Gastro-esophageal reflux disease without esophagitis: Secondary | ICD-10-CM

## 2024-05-17 DIAGNOSIS — G894 Chronic pain syndrome: Secondary | ICD-10-CM

## 2024-05-17 DIAGNOSIS — Z0000 Encounter for general adult medical examination without abnormal findings: Secondary | ICD-10-CM

## 2024-05-17 LAB — LIPID PANEL
Cholesterol: 179 mg/dL (ref 0–200)
HDL: 65.5 mg/dL (ref >39.00)
LDL Cholesterol: 94 mg/dL (ref 0–99)
NonHDL: 113.84
Total CHOL/HDL Ratio: 3
Triglycerides: 99 mg/dL (ref 0.0–149.0)
VLDL: 19.8 mg/dL (ref 0.0–40.0)

## 2024-05-17 LAB — COMPREHENSIVE METABOLIC PANEL WITH GFR
ALT: 42 U/L — ABNORMAL HIGH (ref 0–35)
AST: 33 U/L (ref 0–37)
Albumin: 4.6 g/dL (ref 3.5–5.2)
Alkaline Phosphatase: 67 U/L (ref 39–117)
BUN: 10 mg/dL (ref 6–23)
CO2: 28 meq/L (ref 19–32)
Calcium: 9.8 mg/dL (ref 8.4–10.5)
Chloride: 102 meq/L (ref 96–112)
Creatinine, Ser: 0.69 mg/dL (ref 0.40–1.20)
GFR: 92.64 mL/min (ref >60.00)
Glucose, Bld: 99 mg/dL (ref 70–99)
Potassium: 3.8 meq/L (ref 3.5–5.1)
Sodium: 140 meq/L (ref 135–145)
Total Bilirubin: 0.6 mg/dL (ref 0.2–1.2)
Total Protein: 7.5 g/dL (ref 6.0–8.3)

## 2024-05-17 MED ORDER — ROSUVASTATIN CALCIUM 40 MG PO TABS: 40.0000 mg | ORAL_TABLET | Freq: Every day | ORAL | 1 refills | Status: AC

## 2024-05-17 MED ORDER — LOSARTAN POTASSIUM-HCTZ 50-12.5 MG PO TABS
1.0000 | ORAL_TABLET | Freq: Every day | ORAL | 1 refills | Status: AC
Start: 2024-05-17 — End: ?

## 2024-05-17 MED ORDER — EZETIMIBE 10 MG PO TABS: 10.0000 mg | ORAL_TABLET | Freq: Every day | ORAL | 1 refills | Status: AC

## 2024-05-17 MED ORDER — ESOMEPRAZOLE MAGNESIUM 40 MG PO CPDR: 40.0000 mg | DELAYED_RELEASE_CAPSULE | Freq: Every day | ORAL | 3 refills | Status: AC

## 2024-05-17 MED ORDER — CARISOPRODOL 350 MG PO TABS: ORAL_TABLET | ORAL | 1 refills | Status: AC

## 2024-05-17 NOTE — Assessment & Plan Note (Signed)
 Encourage heart healthy diet such as MIND or DASH diet, increase exercise, avoid trans fats, simple carbohydrates and processed foods, consider a krill or fish or flaxseed oil cap daily.

## 2024-05-17 NOTE — Progress Notes (Signed)
 Subjective:    Patient ID: Yvonne Sanders, female    DOB: Jan 30, 1961, 63 y.o.   MRN: [id number]  Chief Complaint  Patient presents with   Hyperlipidemia   Follow-up    HPI Patient is in today for f/u cholesterol. Discussed the use of AI scribe software for clinical note transcription with the patient, who gave verbal consent to proceed.  History of Present Illness Yvonne Sanders is a 63 year old female who presents with balance issues and neck pain.  She has been experiencing balance issues since a head injury nearly a year ago, on October 8th. She uses a cane for support and experiences episodes of drifting while walking, particularly when fatigued. Severe headaches and a sensation of fogginess occur when moving around for extended periods. These symptoms are exacerbated by physical activity, such as grocery shopping. No dizziness is reported.  Following anterior cervical decompression surgery, she reports increased neck pain, particularly on one side. The pain is intermittent and worsens with activities that require her to look down, such as reading or using her phone. She manages this by holding items up to avoid neck strain.  Her blood pressure has been low since her surgery, leading to a temporary cessation of her blood pressure medication while hospitalized. Blood pressure readings vary, with some normal readings and others low, such as 96/60 mmHg. No leg swelling is noted.  She engages in regular exercise, using a bike for several hours, four to five times a week, although she finds it challenging to maintain this routine on days with appointments or other commitments. She is awaiting shoulder surgery, which has been cleared by her neurologist, but is pending approval from her insurance.    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      Past Surgical History:  Procedure Laterality Date   ANTERIOR CERVICAL DECOMP/DISCECTOMY FUSION N/A 01/14/2024   Procedure: CERVICAL FOUR-FIVE, CERVICAL FIVE-SIX ANTERIOR CERVICAL DECOMPRESSION/DISCECTOMY FUSION;  Surgeon: Gillie Duncans, MD;  Location: [facility] OR;  Service: Neurosurgery;  Laterality: N/A;  ACDF - C3-C4 - C4-C5 - C5-C6   APPENDECTOMY  75   BREAST EXCISIONAL BIOPSY Right    CHOLECYSTECTOMY  88   DIAGNOSTIC LAPAROSCOPY  88   endometrious   GLAUCOMA SURGERY Left 2019   KNEE ARTHROSCOPY Left 04/07/2023   Procedure: Left knee arthroscopy; medial meniscal debridement, chondroplasty;  Surgeon: Melodi Lerner, MD;  Location: [facility] ORS;  Service: Orthopedics;  Laterality: Left;    LUMBAR FUSION  08/2011   l 3-4   LUMBAR FUSION     l 4-5   MENISCUS DEBRIDEMENT Left 04/07/2023   Procedure: DEBRIDEMENT OF MENISCUS;  Surgeon: Melodi Lerner, MD;  Location: [facility] ORS;  Service: Orthopedics;  Laterality: Left;   RIGHT OOPHORECTOMY  75    Family History  Problem Relation Age of Onset   Hyperlipidemia Mother  Hypertension Mother    Hypertension Father    Glaucoma Father    Prostate cancer Father    Renal Disease Father    Macular degeneration Father    Stroke Father    Kidney disease Father    Heart disease Father    Kidney failure Father    Hypertension Sister    Hyperlipidemia Sister    Hypertension Sister    Hypertension Brother    Heart disease Brother    Hypertension Brother    Breast cancer Maternal Grandmother    Breast cancer Maternal Aunt    Diabetes Maternal Aunt    Diabetes Paternal Aunt    Breast cancer Cousin    Colon cancer Neg Hx    Colon polyps Neg Hx     Stomach cancer Neg Hx    Esophageal cancer Neg Hx     Social History   Socioeconomic History   Marital status: Married    Spouse name: Not on file   Number of children: 1   Years of education: Not on file   Highest education level: Not on file  Occupational History   Occupation: Surgical coodinator  Tobacco Use   Smoking status: Never   Smokeless tobacco: Never  Vaping Use   Vaping status: Never Used  Substance and Sexual Activity   Alcohol use: Not Currently   Drug use: No   Sexual activity: Yes    Partners: Male    Birth control/protection: Post-menopausal  Other Topics Concern   Not on file  Social History Narrative   Not much exercise   Social Drivers of Health   Financial Resource Strain: Low Risk  (09/08/2023)   Received from [facility]   Overall Financial Resource Strain (CARDIA)    Difficulty of Paying Living Expenses: Not very hard  Food Insecurity: No Food Insecurity (09/08/2023)   Received from [facility]   Hunger Vital Sign    Within the past 12 months, you worried that your food would run out before you got the money to buy more.: Never true    Within the past 12 months, the food you bought just didn't last and you didn't have money to get more.: Never true  Transportation Needs: No Transportation Needs (09/08/2023)   Received from [facility] - Transportation    Lack of Transportation (Medical): No    Lack of Transportation (Non-Medical): No  Physical Activity: Not on file  Stress: Not on file  Social Connections: Not on file  Intimate Partner Violence: Not on file    Outpatient Medications Prior to Visit  Medication Sig Dispense Refill   acetaminophen  (TYLENOL ) 500 MG tablet Take 1 tablet (500 mg total) by mouth every 6 (six) hours as needed. 30 tablet 0   amitriptyline  (ELAVIL ) 10 MG tablet Take 10 mg by mouth at bedtime.     b complex vitamins capsule Take 1 capsule by mouth 2 (two) times a week.     Biotin 5000 MCG CAPS Take 5,000  mcg by mouth 2 (two) times a week.     Brinzolamide -Brimonidine  (SIMBRINZA) 1-0.2 % SUSP Place 1 drop into both eyes 3 (three) times daily.     carboxymethylcellul-glycerin  (OPTIVE) 0.5-0.9 % ophthalmic solution Place 1-2 drops into both eyes as needed for dry eyes.     cycloSPORINE  (RESTASIS ) 0.05 % ophthalmic emulsion Place 1 drop into both eyes daily as needed (dry eye).     Evolocumab  (REPATHA  SURECLICK) 140 MG/ML SOAJ INJECT 1 ML INTO THE  SKIN EVERY 14 DAYS 2 mL 6   HYDROcodone -acetaminophen  (NORCO/VICODIN) 5-325 MG tablet Take 1 tablet by mouth every 6 (six) hours as needed for moderate pain (pain score 4-6). (Patient taking differently: Take 0.5-1 tablets by mouth daily as needed for moderate pain (pain score 4-6).) 30 tablet 0   HYDROmorphone  (DILAUDID ) 2 MG tablet Take 1 tablet (2 mg total) by mouth every 3 (three) hours as needed for severe pain (pain score 7-10). 30 tablet 0   magnesium  oxide (MAG-OX) 400 (240 Mg) MG tablet Take 400 mg by mouth daily.     metoprolol  succinate (TOPROL -XL) 50 MG 24 hr tablet TAKE 1 TABLET BY MOUTH ONCE DAILY WITH  OR  IMMEDIATELY  FOLLOWING  A  MEAL 90 tablet 2   Multiple Vitamins-Minerals (MULTIVITAMINS THER. W/MINERALS) TABS Take 2 tablets by mouth daily.     Netarsudil -Latanoprost  (ROCKLATAN ) 0.02-0.005 % SOLN Place 2 drops into both eyes at bedtime.     tiZANidine  (ZANAFLEX ) 4 MG tablet Take 1 tablet (4 mg total) by mouth every 6 (six) hours as needed for muscle spasms. 60 tablet 0   carisoprodol  (SOMA ) 350 MG tablet 1 po qd pr (Patient taking differently: Take 350 mg by mouth daily as needed for muscle spasms.) 60 tablet 1   esomeprazole  (NEXIUM ) 40 MG capsule Take 1 capsule (40 mg total) by mouth daily. 90 capsule 0   ezetimibe  (ZETIA ) 10 MG tablet Take 1 tablet (10 mg total) by mouth daily. 90 tablet 1   losartan -hydrochlorothiazide  (HYZAAR) 100-25 MG tablet Take 1 tablet by mouth once daily 90 tablet 2   rosuvastatin  (CRESTOR ) 40 MG tablet Take 1  tablet (40 mg total) by mouth daily. 90 tablet 1   No facility-administered medications prior to visit.    Allergies  Allergen Reactions   Nubain [Nalbuphine Hcl] Anaphylaxis   Nubain [Nalbuphine Hcl] Nausea And Vomiting   Corticosteroids Other (See Comments)    Due to glaucoma can not have steroids   Dilaudid  [Hydromorphone  Hcl] Nausea And Vomiting    Severe itching   Oxycodone -Acetaminophen  Itching   Percodan [Oxycodone -Aspirin ] Hives and Itching   Tomato Rash    Rash, spots on tongue    Review of Systems  Constitutional:  Negative for fever and malaise/fatigue.  HENT:  Negative for congestion.   Eyes:  Negative for blurred vision.  Respiratory:  Negative for cough and shortness of breath.   Cardiovascular:  Negative for chest pain, palpitations and leg swelling.  Gastrointestinal:  Negative for vomiting.  Musculoskeletal:  Negative for back pain.  Skin:  Negative for rash.  Neurological:  Negative for loss of consciousness and headaches.       Objective:    Physical Exam Vitals and nursing note reviewed.  Constitutional:      General: She is not in acute distress.    Appearance: Normal appearance. She is well-developed.  HENT:     Head: Normocephalic and atraumatic.  Eyes:     General: No scleral icterus.       Right eye: No discharge.        Left eye: No discharge.  Cardiovascular:     Rate and Rhythm: Normal rate and regular rhythm.     Heart sounds: No murmur heard. Pulmonary:     Effort: Pulmonary effort is normal. No respiratory distress.     Breath sounds: Normal breath sounds.  Musculoskeletal:        General: Normal range of motion.     Cervical back: Normal range  of motion and neck supple.     Right lower leg: No edema.     Left lower leg: No edema.  Skin:    General: Skin is warm and dry.  Neurological:     Mental Status: She is alert and oriented to person, place, and time.  Psychiatric:        Mood and Affect: Mood normal.        Behavior:  Behavior normal.        Thought Content: Thought content normal.        Judgment: Judgment normal.     BP 96/60 (BP Location: Left Arm, Patient Position: Sitting, Cuff Size: Large)   Pulse 67   Temp 98 F (36.7 C) (Oral)   Resp 18   Ht 5' 5 (1.651 m)   Wt 222 lb (100.7 kg)   LMP 09/03/2007 (Approximate)   SpO2 95%   BMI 36.94 kg/m  Wt Readings from Last 3 Encounters:  05/17/24 222 lb (100.7 kg)  01/14/24 216 lb 14.9 oz (98.4 kg)  01/05/24 217 lb (98.4 kg)    Diabetic Foot Exam - Simple   No data filed    Lab Results  Component Value Date   WBC 5.6 01/05/2024   HGB 11.7 (L) 01/05/2024   HCT 39.2 01/05/2024   PLT 217 01/05/2024   GLUCOSE 99 05/17/2024   CHOL 179 05/17/2024   TRIG 99.0 05/17/2024   HDL 65.50 05/17/2024   LDLCALC 94 05/17/2024   ALT 42 (H) 05/17/2024   AST 33 05/17/2024   NA 140 05/17/2024   K 3.8 05/17/2024   CL 102 05/17/2024   CREATININE 0.69 05/17/2024   BUN 10 05/17/2024   CO2 28 05/17/2024   TSH 1.03 11/14/2023   HGBA1C 5.1 03/30/2021    Lab Results  Component Value Date   TSH 1.03 11/14/2023   Lab Results  Component Value Date   WBC 5.6 01/05/2024   HGB 11.7 (L) 01/05/2024   HCT 39.2 01/05/2024   MCV 88.3 01/05/2024   PLT 217 01/05/2024   Lab Results  Component Value Date   NA 140 05/17/2024   K 3.8 05/17/2024   CO2 28 05/17/2024   GLUCOSE 99 05/17/2024   BUN 10 05/17/2024   CREATININE 0.69 05/17/2024   BILITOT 0.6 05/17/2024   ALKPHOS 67 05/17/2024   AST 33 05/17/2024   ALT 42 (H) 05/17/2024   PROT 7.5 05/17/2024   ALBUMIN 4.6 05/17/2024   CALCIUM  9.8 05/17/2024   ANIONGAP 5 01/05/2024   EGFR 100 01/31/2022   GFR 92.64 05/17/2024   Lab Results  Component Value Date   CHOL 179 05/17/2024   Lab Results  Component Value Date   HDL 65.50 05/17/2024   Lab Results  Component Value Date   LDLCALC 94 05/17/2024   Lab Results  Component Value Date   TRIG 99.0 05/17/2024   Lab Results  Component Value Date    CHOLHDL 3 05/17/2024   Lab Results  Component Value Date   HGBA1C 5.1 03/30/2021       Assessment & Plan:  Preventative health care  Lumbar foraminal stenosis -     Carisoprodol ; 1 po qd pr  Dispense: 60 tablet; Refill: 1  Hyperlipidemia, unspecified hyperlipidemia type Assessment & Plan: Encourage heart healthy diet such as MIND or DASH diet, increase exercise, avoid trans fats, simple carbohydrates and processed foods, consider a krill or fish or flaxseed oil cap daily.    Orders: -  Ezetimibe ; Take 1 tablet (10 mg total) by mouth daily.  Dispense: 90 tablet; Refill: 1 -     Rosuvastatin  Calcium ; Take 1 tablet (40 mg total) by mouth daily.  Dispense: 90 tablet; Refill: 1 -     Comprehensive metabolic panel with GFR -     Lipid panel  Essential hypertension Assessment & Plan: Running low Dec losartan  50/12.5   Orders: -     Losartan  Potassium-HCTZ; Take 1 tablet by mouth daily.  Dispense: 90 tablet; Refill: 1  Chronic pain syndrome Assessment & Plan: Database reviewed   Contract and uds utd   Gastroesophageal reflux disease, unspecified whether esophagitis present -     Esomeprazole  Magnesium ; Take 1 capsule (40 mg total) by mouth daily.  Dispense: 90 capsule; Refill: 3  Need for influenza vaccination -     Flu vaccine trivalent PF, 6mos and older(Flulaval,Afluria,Fluarix,Fluzone)  Assessment and Plan Assessment & Plan Essential hypertension   Blood pressure is low at 96/60 mmHg, likely due to recent surgery and current losartan  dose, which may contribute to hypotension and balance issues. Reduce losartan  dose by half and instruct her to cut current losartan  pills in half if possible.  Balance impairment following head injury   Persistent balance issues are exacerbated by fatigue and prolonged activity. A neurologist indicated potential for non-resolution. Low blood pressure may compound these issues.  Chronic pain syndrome with lumbar spinal stenosis and  cervical spine post-surgical state   Post-surgical cervical spine pain persists, intermittent and exacerbated by certain positions. There are concerns about hydrocodone  dependency. Monitor pain levels and consider hydrocodone  if necessary.  Hyperlipidemia   Long-term statin use was discussed with concerns about dementia risk. Current treatment includes Repatha . The benefits of statins in preventing cardiovascular events were discussed. Continue the current regimen pending further discussion with a cardiologist. Discuss statin use and potential risks with the cardiologist and review cholesterol levels after lab results.  Adult Wellness Visit   Routine adult wellness visit focused on general health maintenance and preventive care. Administer a flu shot and check cholesterol levels.    Yvonne Mccalister R Lowne Chase, DO

## 2024-05-17 NOTE — Assessment & Plan Note (Signed)
 Running low Dec losartan  50/12.5

## 2024-05-17 NOTE — Assessment & Plan Note (Addendum)
Database reviewed  Contract and uds utd

## 2024-05-21 ENCOUNTER — Ambulatory Visit: Payer: Self-pay | Admitting: Family Medicine

## 2024-05-29 ENCOUNTER — Other Ambulatory Visit: Payer: Self-pay | Admitting: Internal Medicine

## 2024-05-29 DIAGNOSIS — I10 Essential (primary) hypertension: Secondary | ICD-10-CM

## 2024-06-15 ENCOUNTER — Telehealth: Payer: Self-pay | Admitting: Pharmacy Technician

## 2024-06-15 NOTE — Telephone Encounter (Signed)
   Pharmacy Patient Advocate Encounter   Received notification from CoverMyMeds that prior authorization for repatha  is required/requested.   Insurance verification completed.   The patient is insured through [facility].   Per test claim: PA required; PA submitted to above mentioned insurance via Latent Key/confirmation #/EOC [id number] Status is pending

## 2024-06-16 ENCOUNTER — Other Ambulatory Visit (HOSPITAL_COMMUNITY): Payer: Self-pay

## 2024-06-16 NOTE — Progress Notes (Signed)
 [facility] [place] NEW PATIENT EVALUATION   Referring Physician:  No ref. provider found Primary Care Physician:  No primary care provider on file.   Patient ID:  Yvonne Sanders is a 63 y.o. (DOB 05-08-1961) female.    Subjective   HPI:  Yvonne Sanders is a 63 y.o. female who presents for follow-up.  Patient is s/p C4/5 and C5/6 ACDF with Dr. Lindalee at [facility] neurosurgery on 01/14/2024.  Patient has recovered well from her surgery.  She is continuing to follow-up with Dr. Lindalee.  Unfortunately, she is continuing to report significant difficulties with her balance as well as headaches.  Headaches are generally characterized as a throbbing and tight squeezing type sensation.  Headaches are generally holocephalic and rated at 9/10 in severity.  Patient does endorse some nausea as well as some twitching of the left eye.  She has been utilizing amitriptyline  for prophylactic therapy and does not feel like this is offering much benefit.  She is using Excedrin Migraine as well as Norco for abortive therapy when the headaches are more moderate to severe.  Patient also notes some difficulty with forward flexion of the cervical spine.  She has been unable to participate in physical therapy as her worker's comp denied the referral to the physical therapist that we placed at her last visit.  Per the referral, it stated that the [facility] would reach out and schedule an in-network physical therapist.  Unfortunately, patient reports she has not heard any updates on her physical therapy.  She has no new complaints or concerning neurological features other than what is reported in the HPI.  Past Medical History, Past Surgery History, Social History, and Family History were reviewed and updated.    No past medical history on file.  No past surgical history on file. No family history on file. Social History   Socioeconomic History  . Marital status: Married  Tobacco Use  .  Smoking status: Never    Passive exposure: Never  . Smokeless tobacco: Never  Vaping Use  . Vaping status: Never Used  Substance and Sexual Activity  . Alcohol use: Never  . Drug use: Never      Current Home Medications  Medication Sig  amitriptyline  HCl (ELAVIL ) 10 mg tablet Take one tablet (10 mg dose) by mouth at bedtime.  B Complex Vitamins (VITAMIN B COMPLEX W/B-12) TABS Take 1 tablet by mouth daily.  Biotin 5000 MCG CAPS Take one capsule (5 mg dose) by mouth 2 (two) times a week.  Carboxymethylcellul-Glycerin  (REFRESH OPTIVE) 0.5-0.9 % SOLN Apply one drop to two drops to eye as needed.  carisoprodol  (SOMA ) 350 mg tablet Take one tablet (350 mg dose) by mouth daily as needed.  cycloSPORINE  (RESTASIS ) 0.05 % ophthalmic emulsion Place one drop into both eyes 2 (two) times daily.  esomeprazole  magnesium  (NEXIUM ) 40 mg capsule Take one capsule (40 mg dose) by mouth daily.  ezetimibe  (ZETIA ) 10 MG tablet Take one tablet (10 mg dose) by mouth daily.  HYDROcodone -acetaminophen  (NORCO) 5-325 mg per tablet Take one tablet by mouth every 6 (six) hours as needed.  losartan -hydrochlorothiazide  (HYZAAR) 50-12.5 mg per tablet Take one tablet by mouth daily.  magnesium  oxide (MAGNESIUM  OXIDE -MG SUPPLEMENT) 400 TABS Take one tablet (400 mg dose) by mouth daily.  meloxicam  (MOBIC ) 7.5 mg tablet Take one tablet (7.5 mg dose) by mouth daily.  metoprolol  succinate (TOPROL -XL) 50 mg 24 hr tablet Take one tablet (50 mg dose) by mouth daily.  REPATHA   SURECLICK 140 MG/ML SOAJ Pen Inject 1 mL (140 mg dose) into the skin every 14 (fourteen) days.  rizatriptan (MAXALT) 5 MG tablet Take one tablet (5 mg dose) by mouth once as needed for Migraine for up to 1 dose. May repeat in 2 hours if needed  ROCKLATAN  0.02-0.005 % SOLN ophthalmic solution Place one drop into both eyes at bedtime.  rosuvastatin  calcium  (CRESTOR ) 40 mg tablet Take one tablet (40 mg dose) by mouth daily.  SIMBRINZA 1-0.2 % ophthalmic  solution Place one drop (0.05 mLs dose) into both eyes 3 (three) times a day.  THERA M PLUS (THERA M PLUS) TABS tablet Take one tablet by mouth daily.  tiZANidine  (ZANAFLEX ) 4 mg tablet Take one tablet (4 mg dose) by mouth every 6 (six) hours as needed. Patient not taking: Reported on 06/16/2024  topiramate (TOPAMAX) 50 MG tablet Take one tablet (50 mg dose) by mouth 2 (two) times daily.    The patient is allergic to nalbuphine and tomato.  Review of Systems is complete and negative except as noted in History of Present Illness.  Objective    PHYSICAL EXAM: BP 126/80 (BP Location: Right Upper Arm, Patient Position: Sitting)   Pulse 63   Ht 5' 5 (1.651 m)   Wt 222 lb (100.7 kg)   LMP  (LMP Unknown)   SpO2 95%   BMI 36.94 kg/m  GENERAL: Awake, alert, in no acute distress Psych: Affect appropriate for situation, patient is calm and cooperative with examination Head: Normocephalic and atraumatic, without obvious abnormality EENT: Normal conjunctivae, dry mucous membranes, no OP obstruction LUNGS: Normal respiratory effort. Non-labored breathing on room air. Clear to auscultation bilaterally. CV: Auscultation: regular rate and rhythm. Extremities: Warm, well perfused, without obvious deformity  MENTAL STATUS EXAM: Orientation: Alert and oriented to person, place and time. Memory: Cooperative, follows commands well. Recent and remote memory normal.. Attention, concentration: Attention span and concentration are normal. Language: Speech is clear and language is normal. Fund of knowledge: Aware of current events, vocabulary appropriate for patient age.   CRANIAL NERVES: CN 2 (Optic): Visual fields intact to confrontation, funduscopic examination without optic disk pallor or edema, retinal vessels are normal. CN 3,4,6 (EOM): Pupils equal and reactive to light. Full extraocular eye movement without nystagmus. CN 5 (Trigeminal): Facial sensation is normal, no weakness of masticatory  muscles. CN 7 (Facial): No facial weakness or asymmetry. CN 8 (Auditory): Auditory acuity grossly normal. CN 9,10 (Glossophar): The uvula is midline, the palate elevates symmetrically. CN 11 (spinal access): Normal sternocleidomastoid and trapezius strength. CN 12 (Hypoglossal): The tongue is midline. No atrophy or fasciculations.   MOTOR: Muscle Strength: Strength - 5/5 and symmetric in the upper and lower extremities except 4+/5 in the left distal upper extremity, no pronation, slight RUE drift.  Muscle Tone: Tone and muscle bulk are normal in the upper and lower extremities.    REFLEXES:   DTRs - 2+ and symmetrical in all four extremities, plantar responses are flexor bilaterally.    COORDINATION:   Intact finger-to-nose, heel-to-shin, and rapid alternating movements, no tremor.    SENSATION:  Intact to light touch, vibration, pinprick.  No sensory extinction to DSS. Negative Romberg test.   GAIT: Routine gait is impaired, tandem gait is impaired.  Romberg positive   DIAGNOSTIC STUDIES:    CTA Head/Neck - 10/13/2023  - No evidence of intracranial large vessel occlusion or severe stenosis.  No evidence of significant ICA stenosis. Unremarkable CTA of the head and neck  MRI Cervical Spine w/o contrast - 10/13/2023 1.  Moderate cervical spondylosis with mild degenerative subluxations and borderline slight narrowing of the C3-C6 spinal canal. 2.  Multilevel disc bulges, protrusions, small extrusions and disc osteophytes. 3.  Moderate to severe canal stenosis most prominently at the C5-C6 level with impingement upon the anterior cord at this level and abutment of the anterior cord at C3-C4 through C5-C6. 4.  Moderate to severe neural foraminal narrowing especially on the right at C4-C5, left more than right at C5-C6 and C6-C7 with less prominent narrowing at other levels..  MRI Head w/ and w/o contrast - 08/21/2023 1.   No acute intracranial abnormality. 2.  A couple small FLAIR  hyperintensities in the cerebral white matter. 3.  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 4.  Partial imaged upper cervical degenerative disc disease with spinal canal narrowing, not optimally evaluated on this exam. Correlate clinically, consider dedicated cervical imaging if indicated. 5.  A small 5 mm oval-shaped nodule at the superficial left parotid gland, differential diagnosis includes a intraparotid/periparotid lymph node or small nonspecific parotid gland lesion. Correlate for a focal palpable finding.  CT Head w/o contrast -06/10/2023 -No acute intracranial abnormality   CT maxillofacial w/o contrast -06/10/2023  LABORATORY STUDIES:   No visits with results within 1 Year(s) from this visit.  Latest known visit with results is:  No results found for any previous visit.      Assessment   63 y.o. female with PMHx as above who presents for follow-up.  Patient is continue to have a significant amount of difficulty with her balance due to her injury at work.  Imbalance has failed to improve since her last visit.  Physical therapy was ordered to help assist with her postop recovery and imbalance.  Unfortunately, her physical therapy referral was denied by [employer].  It was stated that the Worker's Comp. would have to find an in-network physical therapist.  Unfortunately, the patient was never contacted to get physical therapy scheduled.  She is also having severe headaches.  I feel that the patient's imbalance and headaches are likely due to the work injury and spinal injury.  At this point, we are approximately 1 year out from her work injury.  I would suspect that most of the postconcussive syndrome symptoms have resolved at this time.  Patient's neurological examination continues to reveal imbalance. Patient also had some slight weakness in the left upper extremity and 4+/5.  Otherwise, her neurological examination is doing well.  I continue to feel  that the patient's work injury has caused the patient's cervical myelopathy and imbalance due to the injury to her spinal cord that occurred during the accident.  Since patient has had damage to her spinal cord and is myelopathic, it is likely that the patient will never completely return back to 100% normal as she was prior to the work injury.  To help give the patient the best chance at a full recovery, I have continued to recommend that she work with physical therapy.  I feel that the physical therapy will likely also help the patient's headaches as I feel that these are mostly cervicogenic in nature.  I have recommended patient try topiramate for headache prophylaxis.  We will also try her on rizatriptan for abortive/rescue therapy to see if this will offer her some relief.   Plan   1. Traumatic brain injury, with unknown loss of consciousness status, subsequent encounter (Primary) - topiramate (TOPAMAX)  50 MG tablet; Take one tablet (50 mg dose) by mouth 2 (two) times daily.  Dispense: 60 tablet; Refill: 2 - rizatriptan (MAXALT) 5 MG tablet; Take one tablet (5 mg dose) by mouth once as needed for Migraine for up to 1 dose. May repeat in 2 hours if needed  Dispense: 30 tablet; Refill: 2   2. Post concussive syndrome - topiramate (TOPAMAX) 50 MG tablet; Take one tablet (50 mg dose) by mouth 2 (two) times daily.  Dispense: 60 tablet; Refill: 2 - rizatriptan (MAXALT) 5 MG tablet; Take one tablet (5 mg dose) by mouth once as needed for Migraine for up to 1 dose. May repeat in 2 hours if needed  Dispense: 30 tablet; Refill: 2   3. Imbalance -Secondary to work injury - ACDF x 2 levels performed on 01/14/2024 - AMB REFERRAL TO PHYSICAL THERAPY EVALUATION AND TREATMENT; Future  4. 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 Cervical stenosis of spinal canal - Continue follow-up  with neurosurgery - ACDF x 2 levels performed on 01/14/2024  6. Frequent headaches - topiramate (TOPAMAX) 50 MG tablet; Take one tablet (50 mg dose) by mouth 2 (two) times daily.  Dispense: 60 tablet; Refill: 2 - rizatriptan (MAXALT) 5 MG tablet; Take one tablet (5 mg dose) by mouth once as needed for Migraine for up to 1 dose. May repeat in 2 hours if needed  Dispense: 30 tablet; Refill: 2    Limit electronic use Ensure regular adequate sleep every night Physical activity as tolerated do not over exert. Engage in regular gentle exercise (light walking, etc as tolerated) Mental activity as tolerated (reading, work, catering manager)  Avoid contact sports and other activities that could potentially lead to additional head / neck injuries  Write things down. Keep a record of important events, people's names, tasks or other things that are difficult to remember. Follow a routine. Keep a consistent schedule, keep things in designated places to avoid confusion and take the same routes when going to frequently visited destinations. Take breaks. Make arrangements at work or school to take breaks as needed. Alter work expectations or tasks. Appropriate changes at work or school may include having instructions read to you, allowing more time to complete tasks or breaking down tasks into smaller steps. Avoid distractions. Minimize distractions such as loud background noise from a television or radio. Stay focused. Work on one task at a time.  Risks, benefits, and alternatives of the medications and treatment plan prescribed today were discussed, and patient expressed understanding and agreement with the plan.  All new prescription medications and changes in current prescription dosages were discussed with the patient, including patient education, medication name, use, dosage, potential side effects, drug interactions, consequences of not using/taking and special instructions. Patient expressed understanding. No  barriers to adherence.  Follow up in about 3 months (around 09/16/2024).  Electronically Signed: Fonda FREDRIK Minus, DNP, AGNP-C Neurology Nurse Practitioner  [facility] [place] 06/17/2024 1:42 PM  *This note was dictated with voice recognition software. Inadvertently, similar sounding words can, sometimes, get transcribed incorrectly

## 2024-06-16 NOTE — Telephone Encounter (Signed)
 Pharmacy Patient Advocate Encounter  Received notification from [facility] that Prior Authorization for repatha  has been APPROVED from 06/15/24 to 06/15/27. Ran test claim, Copay is $0.00- one month. This test claim was processed through [facility]- copay amounts may vary at other pharmacies due to pharmacy/plan contracts, or as the patient moves through the different stages of their insurance plan.   PA #/Case ID/Reference #: [id number]

## 2024-11-16 ENCOUNTER — Encounter: Payer: PRIVATE HEALTH INSURANCE | Admitting: Family Medicine

## 2024-11-18 ENCOUNTER — Other Ambulatory Visit (HOSPITAL_COMMUNITY)

## 2024-12-10 ENCOUNTER — Ambulatory Visit: Admitting: Internal Medicine
